# Patient Record
Sex: Female | Born: 1935 | Race: White | Hispanic: No | Marital: Married | Smoking: Never smoker
Health system: Southern US, Community
[De-identification: ages and names within clinical notes are randomized; demographics above are authoritative.]

## PROBLEM LIST (undated history)

## (undated) DIAGNOSIS — E559 Vitamin D deficiency, unspecified: Secondary | ICD-10-CM

## (undated) DIAGNOSIS — E785 Hyperlipidemia, unspecified: Secondary | ICD-10-CM

## (undated) DIAGNOSIS — E119 Type 2 diabetes mellitus without complications: Secondary | ICD-10-CM

## (undated) DIAGNOSIS — I10 Essential (primary) hypertension: Secondary | ICD-10-CM

## (undated) DIAGNOSIS — I639 Cerebral infarction, unspecified: Secondary | ICD-10-CM

## (undated) DIAGNOSIS — J9600 Acute respiratory failure, unspecified whether with hypoxia or hypercapnia: Secondary | ICD-10-CM

## (undated) DIAGNOSIS — G40909 Epilepsy, unspecified, not intractable, without status epilepticus: Secondary | ICD-10-CM

## (undated) DIAGNOSIS — E079 Disorder of thyroid, unspecified: Secondary | ICD-10-CM

## (undated) DIAGNOSIS — F259 Schizoaffective disorder, unspecified: Secondary | ICD-10-CM

## (undated) DIAGNOSIS — N289 Disorder of kidney and ureter, unspecified: Secondary | ICD-10-CM

---

## 1998-07-04 ENCOUNTER — Ambulatory Visit (HOSPITAL_COMMUNITY): Admission: RE | Admit: 1998-07-04 | Discharge: 1998-07-04 | Payer: Self-pay | Admitting: Cardiology

## 1998-07-05 ENCOUNTER — Encounter: Payer: Self-pay | Admitting: Cardiology

## 1999-04-04 ENCOUNTER — Ambulatory Visit (HOSPITAL_COMMUNITY): Admission: RE | Admit: 1999-04-04 | Discharge: 1999-04-04 | Payer: Self-pay | Admitting: Cardiology

## 1999-04-04 ENCOUNTER — Encounter: Payer: Self-pay | Admitting: Cardiology

## 1999-12-13 ENCOUNTER — Inpatient Hospital Stay (HOSPITAL_COMMUNITY): Admission: EM | Admit: 1999-12-13 | Discharge: 1999-12-23 | Payer: Self-pay | Admitting: Psychiatry

## 1999-12-15 ENCOUNTER — Encounter: Payer: Self-pay | Admitting: Psychiatry

## 2001-10-28 ENCOUNTER — Encounter: Payer: Self-pay | Admitting: Internal Medicine

## 2001-10-28 ENCOUNTER — Encounter: Admission: RE | Admit: 2001-10-28 | Discharge: 2001-10-28 | Payer: Self-pay | Admitting: Internal Medicine

## 2004-04-26 ENCOUNTER — Ambulatory Visit: Payer: Self-pay | Admitting: Unknown Physician Specialty

## 2004-05-27 ENCOUNTER — Ambulatory Visit: Payer: Self-pay | Admitting: Unknown Physician Specialty

## 2004-06-26 ENCOUNTER — Ambulatory Visit: Payer: Self-pay | Admitting: Unknown Physician Specialty

## 2004-07-27 ENCOUNTER — Ambulatory Visit: Payer: Self-pay | Admitting: Unknown Physician Specialty

## 2004-09-04 ENCOUNTER — Ambulatory Visit: Payer: Self-pay | Admitting: Unknown Physician Specialty

## 2004-09-24 ENCOUNTER — Ambulatory Visit: Payer: Self-pay | Admitting: Unknown Physician Specialty

## 2004-10-25 ENCOUNTER — Ambulatory Visit: Payer: Self-pay | Admitting: Unknown Physician Specialty

## 2004-12-05 ENCOUNTER — Ambulatory Visit: Payer: Self-pay | Admitting: Unknown Physician Specialty

## 2004-12-25 ENCOUNTER — Ambulatory Visit: Payer: Self-pay | Admitting: Unknown Physician Specialty

## 2005-01-28 ENCOUNTER — Ambulatory Visit: Payer: Self-pay | Admitting: Unknown Physician Specialty

## 2005-02-24 ENCOUNTER — Ambulatory Visit: Payer: Self-pay | Admitting: Unknown Physician Specialty

## 2005-03-27 ENCOUNTER — Ambulatory Visit: Payer: Self-pay | Admitting: Unknown Physician Specialty

## 2005-04-26 ENCOUNTER — Ambulatory Visit: Payer: Self-pay | Admitting: Unknown Physician Specialty

## 2005-05-27 ENCOUNTER — Ambulatory Visit: Payer: Self-pay | Admitting: Unknown Physician Specialty

## 2005-06-26 ENCOUNTER — Ambulatory Visit: Payer: Self-pay | Admitting: Unknown Physician Specialty

## 2005-07-27 ENCOUNTER — Ambulatory Visit: Payer: Self-pay | Admitting: Unknown Physician Specialty

## 2005-08-27 ENCOUNTER — Ambulatory Visit: Payer: Self-pay | Admitting: Unknown Physician Specialty

## 2005-09-24 ENCOUNTER — Ambulatory Visit: Payer: Self-pay | Admitting: Unknown Physician Specialty

## 2005-10-26 ENCOUNTER — Ambulatory Visit: Payer: Self-pay | Admitting: Unknown Physician Specialty

## 2005-11-24 ENCOUNTER — Ambulatory Visit: Payer: Self-pay | Admitting: Unknown Physician Specialty

## 2005-12-25 ENCOUNTER — Ambulatory Visit: Payer: Self-pay | Admitting: Unknown Physician Specialty

## 2006-01-24 ENCOUNTER — Ambulatory Visit: Payer: Self-pay | Admitting: Unknown Physician Specialty

## 2006-03-12 ENCOUNTER — Ambulatory Visit: Payer: Self-pay | Admitting: Unknown Physician Specialty

## 2006-03-27 ENCOUNTER — Ambulatory Visit: Payer: Self-pay | Admitting: Unknown Physician Specialty

## 2006-04-26 ENCOUNTER — Ambulatory Visit: Payer: Self-pay | Admitting: Unknown Physician Specialty

## 2006-05-27 ENCOUNTER — Ambulatory Visit: Payer: Self-pay | Admitting: Unknown Physician Specialty

## 2006-06-26 ENCOUNTER — Ambulatory Visit: Payer: Self-pay | Admitting: Unknown Physician Specialty

## 2006-07-27 ENCOUNTER — Ambulatory Visit: Payer: Self-pay | Admitting: Unknown Physician Specialty

## 2006-08-27 ENCOUNTER — Ambulatory Visit: Payer: Self-pay | Admitting: Unknown Physician Specialty

## 2006-09-25 ENCOUNTER — Ambulatory Visit: Payer: Self-pay | Admitting: Unknown Physician Specialty

## 2006-10-26 ENCOUNTER — Ambulatory Visit: Payer: Self-pay | Admitting: Unknown Physician Specialty

## 2006-11-25 ENCOUNTER — Ambulatory Visit: Payer: Self-pay | Admitting: Unknown Physician Specialty

## 2006-12-26 ENCOUNTER — Ambulatory Visit: Payer: Self-pay | Admitting: Unknown Physician Specialty

## 2007-01-25 ENCOUNTER — Ambulatory Visit: Payer: Self-pay | Admitting: Unknown Physician Specialty

## 2007-02-25 ENCOUNTER — Ambulatory Visit: Payer: Self-pay | Admitting: Unknown Physician Specialty

## 2007-03-28 ENCOUNTER — Ambulatory Visit: Payer: Self-pay | Admitting: Unknown Physician Specialty

## 2007-04-27 ENCOUNTER — Ambulatory Visit: Payer: Self-pay | Admitting: Unknown Physician Specialty

## 2007-05-30 ENCOUNTER — Ambulatory Visit: Payer: Self-pay | Admitting: Unknown Physician Specialty

## 2007-06-27 ENCOUNTER — Ambulatory Visit: Payer: Self-pay | Admitting: Unknown Physician Specialty

## 2007-07-29 ENCOUNTER — Ambulatory Visit: Payer: Self-pay | Admitting: Unknown Physician Specialty

## 2007-08-28 ENCOUNTER — Ambulatory Visit: Payer: Self-pay | Admitting: Unknown Physician Specialty

## 2007-09-25 ENCOUNTER — Ambulatory Visit: Payer: Self-pay | Admitting: Unknown Physician Specialty

## 2007-10-26 ENCOUNTER — Ambulatory Visit: Payer: Self-pay | Admitting: Unknown Physician Specialty

## 2007-11-25 ENCOUNTER — Ambulatory Visit: Payer: Self-pay | Admitting: Unknown Physician Specialty

## 2007-12-26 ENCOUNTER — Ambulatory Visit: Payer: Self-pay | Admitting: Unknown Physician Specialty

## 2008-01-25 ENCOUNTER — Ambulatory Visit: Payer: Self-pay | Admitting: Unknown Physician Specialty

## 2008-04-02 ENCOUNTER — Ambulatory Visit: Payer: Self-pay | Admitting: Unknown Physician Specialty

## 2008-04-26 ENCOUNTER — Ambulatory Visit: Payer: Self-pay | Admitting: Unknown Physician Specialty

## 2008-05-27 ENCOUNTER — Ambulatory Visit: Payer: Self-pay | Admitting: Unknown Physician Specialty

## 2008-06-26 ENCOUNTER — Ambulatory Visit: Payer: Self-pay | Admitting: Unknown Physician Specialty

## 2008-07-27 ENCOUNTER — Ambulatory Visit: Payer: Self-pay | Admitting: Unknown Physician Specialty

## 2008-08-27 ENCOUNTER — Ambulatory Visit: Payer: Self-pay | Admitting: Unknown Physician Specialty

## 2008-09-19 ENCOUNTER — Encounter: Admission: RE | Admit: 2008-09-19 | Discharge: 2008-09-19 | Payer: Self-pay | Admitting: Internal Medicine

## 2008-09-24 ENCOUNTER — Ambulatory Visit: Payer: Self-pay | Admitting: Unknown Physician Specialty

## 2008-10-25 ENCOUNTER — Ambulatory Visit: Payer: Self-pay | Admitting: Unknown Physician Specialty

## 2008-11-24 ENCOUNTER — Ambulatory Visit: Payer: Self-pay | Admitting: Unknown Physician Specialty

## 2008-12-25 ENCOUNTER — Ambulatory Visit: Payer: Self-pay | Admitting: Unknown Physician Specialty

## 2009-02-24 ENCOUNTER — Ambulatory Visit: Payer: Self-pay | Admitting: Unknown Physician Specialty

## 2009-03-27 ENCOUNTER — Ambulatory Visit: Payer: Self-pay | Admitting: Unknown Physician Specialty

## 2009-05-27 ENCOUNTER — Ambulatory Visit: Payer: Self-pay | Admitting: Unknown Physician Specialty

## 2009-07-29 ENCOUNTER — Ambulatory Visit: Payer: Self-pay | Admitting: Unknown Physician Specialty

## 2009-09-24 ENCOUNTER — Ambulatory Visit: Payer: Self-pay | Admitting: Unknown Physician Specialty

## 2009-11-26 ENCOUNTER — Ambulatory Visit: Payer: Self-pay | Admitting: Unknown Physician Specialty

## 2009-12-25 ENCOUNTER — Ambulatory Visit: Payer: Self-pay | Admitting: Unknown Physician Specialty

## 2010-01-24 ENCOUNTER — Ambulatory Visit: Payer: Self-pay | Admitting: Unknown Physician Specialty

## 2010-02-24 ENCOUNTER — Ambulatory Visit: Payer: Self-pay | Admitting: Unknown Physician Specialty

## 2010-03-27 ENCOUNTER — Ambulatory Visit: Payer: Self-pay | Admitting: Unknown Physician Specialty

## 2010-05-27 ENCOUNTER — Ambulatory Visit: Payer: Self-pay | Admitting: Unknown Physician Specialty

## 2010-06-26 ENCOUNTER — Ambulatory Visit: Payer: Self-pay | Admitting: Unknown Physician Specialty

## 2010-07-28 ENCOUNTER — Ambulatory Visit: Payer: Self-pay | Admitting: Unknown Physician Specialty

## 2010-08-16 ENCOUNTER — Encounter: Payer: Self-pay | Admitting: Internal Medicine

## 2010-08-17 ENCOUNTER — Encounter: Payer: Self-pay | Admitting: Internal Medicine

## 2010-08-27 ENCOUNTER — Ambulatory Visit: Payer: Self-pay | Admitting: Unknown Physician Specialty

## 2010-09-25 ENCOUNTER — Ambulatory Visit: Payer: Self-pay | Admitting: Unknown Physician Specialty

## 2010-10-27 ENCOUNTER — Ambulatory Visit: Payer: Self-pay | Admitting: Unknown Physician Specialty

## 2010-11-25 ENCOUNTER — Ambulatory Visit: Payer: Self-pay | Admitting: Unknown Physician Specialty

## 2010-12-12 NOTE — H&P (Signed)
[facility]  Patient:    Ashley Velez, Ashley Velez                   MRN: [id number] Adm. Date:  [id number] Attending:  Theresa Mulligan Fabmy Dictator:   Romona Curls, N.P.                         History and Physical  IDENTIFYING INFORMATION:  Patient is a 76 year old married white female, voluntarily admitted secondary to increasing confusion and change in mental status.  HISTORY OF PRESENT ILLNESS:  Patient has a long history of schizoaffective disorder.  She has been hospitalized in the past, as well as receiving ECT treatments.  She is currently an outpatient client of [facility].  Family reports that patient has been acting bizarrely, has not been taking care of herself or taking care of her ADLs.  She has not been following her diabetic regimen.  She has been very confused, does not know where she is. Patient presents today very confused.  She does articulate thinking that she thought that she was dead at one time, that her husband Pilar Plate was dead at one time, and states she was "tearing clothes apart," in response to being upset thinking that her husband was dead.  She is a very poor historian at present, as she is still quite confused and she is unable to really articulate specifically with circumstances leading to this hospitalization.  She does say, "I had no intention of killing myself."  She denies depression or suicidal or homicidal ideation.  It is difficult to understand whether she was experiencing auditory and visual hallucinations.  She is describing delusions, thinking that she had died and different people had died and was not sure if what she was seeing and thinking were "dreams."  Family reports that she has had increased sleep.  PAST PSYCHIATRIC HISTORY:  Again, given the patients poor historical status at this point, it is known that she has had an inpatient hospitalization approximately six years ago.  It does appear  that she has had subsequent hospitalizations and has received ECT in 1995 and questionably in the year 2000.  She is married.  She was unable to tell me how many years.  She does have one child, was unable to tell me whether it was a boy or girl.  FAMILY HISTORY:  She states her mother tried to commit suicide and has "mental illness," but was unable to articulate whether mother actually suicided or is still alive.  ALCOHOL AND DRUG HISTORY:  She denies.  MEDICAL HISTORY:  Primary care Erryn Dickison is an [facility].  She has seen Dr. Erling Cruz in neurology in the past.  MEDICAL PROBLEMS:  Diabetes, type 2.  She has a recent diagnosis of hyperthyroidism that appeared with radiation treatment in December of 2000. History of a brain lesion in 1995, osteoarthritis.  She also had a single seizure episode in 1988.  MEDICATIONS: 1. Zyprexa 10 mg q.h.s. 2. Effexor XR 75 mg q.d. 3. Stelazine 10 mg q.h.s. 4. Prinivil 5 mg q.d.  Per patients husband, this has been on hold because    patients blood pressure has been low. 5. Ativan 1 mg b.i.d. 6. Glucotrol XL 10 mg two tabs q.d. 7. _________  50 mg b.i.d.  DRUG ALLERGIES:  She has no known allergies.  PHYSICAL EXAMINATION:  Physical exam is pending.  Patient refuses exam at present.  She appears quite perplexed and confused.  Patient is in no acute distress.  Her CBGs this a.m. were 92.  Her vital signs were 183/93, 177/92, 16, 71, and 98.9.  MENTAL STATUS EXAMINATION:  Appearance and Behavior:  Casually dressed white female.  She is exhibiting psychomotor retardation and her speech is sparse, irrelevant at times.  Her mood is perplexed with depressive overtones.  She appears somewhat anxious and guarded.  She is very tangential and difficult to keep on track.  She knows she is in a psychiatric hospital but was unable to give the name.  She was unable to give the year or the month.  It is difficult to know whether she does not actually know these  items, or whether she is just unable to focus on the conversation enough to attempt an answer.  She denies suicidal ideation, homicidal ideation.  Again she could not articulate auditory and visual hallucinations.  She presents quite delusional, thinking that she herself has died as well as other family members.  DIAGNOSES: Axis I:    Schizoaffective disorder, rule out organic affective disorder. Axis II:   Deferred. Axis III:  1. Diabetes mellitus.            2. Osteoarthritis.            3. History of hyperthyroidism.            4. History of brain lesions. Axis IV:   Severe, related to problems with primary support group and medical            problems. Axis V:    Current GAF is 25, highest this past year was 60.  TREATMENT PLAN AND RECOMMENDATIONS:  We will voluntarily admit Ms. [name] to [facility] for stabilization, provide 15 minute checks for safety.  We will resume her Zyprexa 10 mg q.h.s.  We will increase her Effexor XR to 150 mg q.d., Stelazine 10 mg q.h.s., Ativan 1 mg t.i.d.  Additionally we will check her CBGs twice a day.  We will give Glucotrol XL 10 mg q.d. until we can establish patients blood sugar readings.  We will resume her _________  50 mg b.i.d.  Schedule MRI for May 21 to rule out affective disorder.  LABORATORY VALUES:  Pending laboratory values at time of this dictation are TSH, T4, T3, UA, and UDS.  Tentative length of stay and discharge plans will be 4-5 days with follow-up with Dr. Theresa Mulligan at [facility]. DD:  12/14/99 TD:  12/15/99 Job: [id number]

## 2010-12-12 NOTE — H&P (Signed)
[facility]  Patient:    Ashley Velez, Ashley Velez                   MRN: [id number] Adm. Date:  [id number] Attending:  Theresa Mulligan Fabmy Dictator:   Romona Curls, N.P.                         History and Physical  ALLERGIES:  No known allergies.  REVIEW OF SYSTEMS:  The patient was admitted to the hospital with acute mental status changes and severe depression with psychotic features.  She still remains a poor historian.  It is difficult for her to keep on track and answer questions.  This will be a limited review of systems, given this.  HEENT: Head:  The patient denies headache.  Eyes:  The patient wears glasses.  Ears: Denies earache.  Nose:  Denies nose bleed.  CARDIOVASCULAR:  States she has had chest pain in the past.  Denies heart attack.  Says she has high blood pressure.  PULMONARY:  States she is a smoker, and she is unable to quantify how much.  Denies a history of asthma.  GASTROINTESTINAL:  Denies abdominal pain, peptic ulcer disease, nausea, vomiting, diarrhea.  Was unable to articulate when her last bowel movement was.  GENITOURINARY:  Denied dysuria, urgency, and frequency.  MUSCULOSKELETAL:  States she has never fractured anything and suffers from no pain.  NEUROLOGIC:  She had a single seizure in 1988 and a history of a lesion diagnosed in 1995.  HEMATOPOIETIC:  The patient thinks she has received a blood transfusion.  She was unable to articulate in response to what or when.  ENDOCRINE:  The patient has a history of hyperthyroidism with radiation treatment in December 2000.  She is diabetic.  PHYSICAL EXAMINATION:  VITAL SIGNS:  Temperature 97.6, pulse 74, respirations 16, blood pressure 195/95.  GENERAL:  She is a well-nourished white female.  She is minimally cooperative. Again, she appears profoundly depressed, regressed, with psychomotor retardation.  SKIN:  No abnormal bruises, rashes, or swelling noted.  HEAD:   Normocephalic.  Eyes:  Extraocular movements were intact.  Pupils were equal, round and reactive to light.  Funduscopic exam was not visualized. Patient was unable to cooperate with exam.  Ears:  Unable to visualize tympanic membranes.  There was cerumen bilaterally.  Nose:  Nares were erythematous.  There was no septal deviation noted.  Throat:  Buccal mucosa was clear.  Tongue was in midline, no vesiculations.  Dentition:  The patient had several missing teeth.  Dentition appears to be in poor repair.  NECK:  Supple with trachea midline.  THORAX:  There is kyphosis noted.  BREASTS:  Deferred.  LUNGS:  Clear to auscultation with good respiratory effort.  HEART:  S1, S2, no clicks, rubs, murmurs could be appreciated.  VASCULAR:  Pulses were equal.  There were no bruits at carotid, aortic, or renal.  There was no dependent edema.  The patient had good pedal pulses.  ABDOMEN:  Soft without tenderness to palpation.  There were no masses palpated.  Bowel sounds x 4 were noted.  There was no organomegaly noted.  GLANDULAR:  There was no adenopathy noted at supraclavicular, cervical.  Her thyroid felt full.  There were no nodules appreciated.  BONES AND JOINTS:  Osteoarthritis changes noted to metacarpal joints.  There was no tenderness with palpation.  MUSCULOSKELETAL:  Strength was equal, 5/5 bilaterally.  There was no atrophy noted,  and extremities had full range of motion with no cyanosis or clubbing.  NEUROLOGIC:  Cranial nerves II-XII were grossly intact.  Motor function was intact with 5/5.  Cerebellar function was intact.  Patient could perform finger-to-nose.  Gait:  She was able to walk in tandem, on her toes, on her heels.  Her Romberg was negative.  Reflexes were depressed at 1+.  IMPRESSION: 1. Hypertension.  Resume Prinivil and discontinue Effexor.  Will also add    clonidine 0.1 mg for systolic greater than 0000000, diastolic greater than 90,    until patient  stabilizes. 2. Depressed reflexes versus poor patients inability to cooperate with exam. 3. Change in mental status, severe depression with psychotic features. DD:  12/15/99 TD:  12/15/99 Job: [id number]

## 2010-12-12 NOTE — Discharge Summary (Signed)
[facility]  Patient:    Ashley Velez, Ashley Velez                   MRN: [id number] Adm. Date:  [id number] Disc. Date: [id number] Attending:  Theresa Mulligan Fabmy Dictator:   Dennard Nip, N.P.                           Discharge Summary  HISTORY OF PRESENT ILLNESS:  Patient is a 76 year old married white female voluntarily admitted secondary to increasing confusion and change in mental status.  Patient has a long history of schizoaffective disorder.  She has been hospitalized in the past as well as receiving ECT treatments.  She is currently an outpatient client of [facility].  Family reports that patient has been acting bizarrely, has not been taking care of herself or taking care of her ADLs.  She has not been following her diabetic regime.  She has been very confused, does not know where she is.  Patient presents today very confused.  She does not articulate, thinking that she thought that she was dead at one time, that her husband Pilar Plate was dead at one time, and states she was tearing clothes apart in response to being upset, thinking that her husband was dead.  She is a very poor historian at present and she is still quite confused and she is unable to really articulate specifically with circumstances leading to this hospitalization.  She does say, "I had no intention of killing myself."  She denies depression or experiencing auditory or visual hallucinations.  She is describing delusions, thinking that she has died and different people have died and she was not sure if what she was seeing and thinking were "dreams."  Family has reported that she has had increased sleep.  PSYCHIATRIC HISTORY:  Patient has had an inpatient hospitalization approximately six years ago with subsequent hospitalizations and receiving ECT in 1995 and some question if she received it in 2000.  MEDICAL HISTORY:  Primary care doctor is [facility].  She also  has seen Dr. Erling Cruz, neurologist in the past.  Patient has a history of diabetes mellitus, type 2, recent diagnosis of hyperthyroidism, history of a brain lesion in 1995, osteoarthritis, single seizure episode in 1988.  ADMISSION MEDICATIONS: 1. Zyprexa 10 mg q.h.s. 2. Effexor XR 75 mg q.d. 3. Seldane 10 mg q.h.s. 4. Prinivil 5 mg q.d.  Per patients husband this has been placed on hold    because patients blood pressure has been low. 5. Ativan 1 mg b.i.d. 6. Glucotrol XL 10 mg two tabs q.d.  DRUG ALLERGIES:  No known drug allergies.  PHYSICAL EXAMINATION:  Physical exam is pending.  Patient refuses exam at present.  She appears quite perplexed and confused.  Patient is in no acute distress.  LABORATORY:  Her CBGs this a.m. were 92.  Vital signs were blood pressure 183/93, 177/92, respirations 16, pulse 71, and temperature 98.9.  While in the hospital she continued to refuse to have a physical exam.  MENTAL STATUS EXAMINATION:  A casually dressed white female, exhibiting psychomotor retardation and her speech is sparse and irrelevant at times.  Her mood is perplexed with depressive overtones, somewhat anxious and guarded, very tangential and difficult to keep on track.  She knows she is in the psychiatric hospital, is unable to give the name.  She was unable to give the year or the month and it was difficult to know  whether she does not actually know these items, or whether she is unable to focus on the conversation enough to attempt an answer.  She denied suicidal ideation or homicidal ideation. Again she could not articulate auditory or visual hallucinations.  She presents quite delusional, thinking that she herself has died as well as other family members.  DIAGNOSES ON ADMISSION: Axis I:    Schizoaffective disorder, rule out organic affective disorder. Axis II:   Deferred. Axis III:  1. Diabetes mellitus, type 2.            2. Osteoarthritis.            3. History of  hyperthyroidism.            4. History of brain lesion. Axis IV:   Severe, related to problems with primary support group and medical            problems. Axis V:    Current global assessment of functioning is 25, highest in past            year was 51.  LABORATORY DATA:  Her CBC with differential, white blood count was elevated at 11.8.  Her C-MET showed some abnormalities.  Her sodium was increased at 146. Her potassium was within normal limits at 3.8.  Chloride was high at 114, and her glucose was high at 173.  Her T3 was high at 41.7, TSH within normal limits, free T4 was within normal limits.  HOSPITAL COURSE:  The patient was admitted to the psychiatric unit and was initially placed on Zyprexa 10 mg at h.s.  She supposedly is on Seldane 10 mg but we held that until the husband could give [place] a schedule.  We started her on Effexor XR 150 mg p.o. q.d. and Ativan 1 mg t.i.d.  We did check her CBGs b.i.d. and she was on Glucotrol XL 10 mg q.d.  We did have a MRI on May 21 to rule out any organic disorder and we did schedule the Seldane for 10 mg at h.s.  On the 20th we did reduce her Effexor XL to 75 p.o. q.d. and gave her Prinivil 5 mg p.o. q.d.  We also ordered Clonidine 0.1 mg q.4h. p.r.n. for hypertension and then ordered some Haldol 2 mg p.o. or IM p.r.n. agitation. We also added Valium 10 mg q.4h. p.r.n. for agitation, stopped the Effexor, and started her on Wellbutrin SR 150 mg h.s.  Then we increased the Wellbutrin SR to 150 mg b.i.d.  We continued to monitor her, monitor her vital signs and her CBGs.  We changed the Wellbutrin to 150 SR in the morning and placed her on 2000 calorie ADA diet along with Robitussin 10 cc p.r.n. for cough.  She was confused and delusional, believing that she is on vacation, needs to unpack.  She did undress to the waist and came down to the nurses station, needing frequent redirection.  Finally she had to be moved to another room so she could be  closer to the nurses station due to her needs.  The MRI results came in and she was subsequently referred to neurologist.  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  Differential considerations are acoustic neuroma versus Schwannoma versus meningioma.  Less likely possibilities are a epidermoid versus metastatic disease versus lymphoma.  There were some small vessel type disease changes, _________ and then there were  some inflammatory changes in the paranasal sinuses as described.  Throughout her stay the patient continued to seem perplexed, delusional, religiously preoccupied.  On May 24 she was marginally improved, but she remained perplexed and somewhat confused.  It was decided to transfer the patient to [facility] for further treatment.  A bed at [facility] was given away. Both [facility] and [facility] have patients information and will call with available beds.  Patient was seen on May 25.  She seemed to be a little bit more organized, speech relevant, less confused.  Her mood and affect were brighter, and it was decided she will be discharged and transferred.  A family session was ordered with the husband to ascertain his opinion of patients readiness for patient management.  It was felt like we could ask him to pursue home health report if he needed it.  On May 26 patient continued to have some confusion with some disorientation at times.  She says she is unsure if she is on the right floor.  She is less depressed but still disorganized, she regrets.  She reports not sleeping well, but appeared to sleep six hours. Blood pressure has been elevated.  Patient was not yet ready for discharge. Husband feels she is not improved enough for him to manage her at home.  The Wellbutrin was increased at that time.  On May 27 the patient was still very confused with disorientation.  She reported  not sleeping a lot, but the sleep was recorded as eight hours.  Appetite was fair and we continued on the Wellbutrin.  Patient reported feeling a little bit better on the 28th and she appears less depressed.  She still reports periods of confusion and disorientation.  Appetite was fair and sleep was fair.  On May 28 the husband still felt the patient needed to be transferred for ECT.  On Dec 23, 1999 the patient is unchanged today.  She remains withdrawn with disorganized thinking and some confusion.  She slept five hours.  Her appetite was decreased.  She continued to appear depressed, although she has some periods of time in which she appeared a little brighter.  Patients husband feels she is reaching near her baseline.  She responded well to ECT in the past and we felt like she needed to be evaluated for ECT at [facility] and it was decided that she would be discharged and transferred to [facility] for workup of ECT.  CONDITION ON DISCHARGE:  The patient is improved in some ways that she is sleeping and eating okay.  There is no suicidal ideation; however, patient did remain confused, somewhat withdrawn and disoriented.  DISPOSITION:  The patient is transferred to [facility] for consideration of ECT.  FOLLOW-UP:  The patient is to follow up at [facility] for consideration of ECT.  DISCHARGE MEDICATIONS: 1. Zyprexa 10 mg one tablet at h.s. 2. Ativan 1 mg t.i.d. 3. Glucotrol XL 10 mg one tab daily. 4. Seldane 10 mg one tab at h.s. 5. Prinivil 5 mg one tab daily. 6. Wellbutrin SR 150 one tablet morning and at noon. 7. Clonidine 0.1 mg one tablet twice a day as needed. 8. Diclofunte 50 mg one tablet b.i.d.  FINAL DIAGNOSES: Axis I:    Schizoaffective disorder, rule out organic effective disorder. Axis II:   Deferred. Axis III:  1. Diabetes mellitus.            2. Osteoarthritis.  3. History of hyperthyroidism.             4. History of brain lesions. Axis IV:   Severe, related to medical problems and confusion. Axis V:    Current global assessment of functioning 40, highest in past            year was 81. DD:  01/08/00 TD:  01/13/00 Job: [id number]

## 2011-01-26 ENCOUNTER — Ambulatory Visit: Payer: Self-pay | Admitting: Unknown Physician Specialty

## 2011-02-25 ENCOUNTER — Ambulatory Visit: Payer: Self-pay | Admitting: Unknown Physician Specialty

## 2011-03-28 ENCOUNTER — Ambulatory Visit: Payer: Self-pay | Admitting: Unknown Physician Specialty

## 2011-05-28 ENCOUNTER — Ambulatory Visit: Payer: Self-pay | Admitting: Unknown Physician Specialty

## 2011-06-29 ENCOUNTER — Ambulatory Visit: Payer: Self-pay | Admitting: Unknown Physician Specialty

## 2011-07-29 ENCOUNTER — Ambulatory Visit: Payer: Self-pay | Admitting: Unknown Physician Specialty

## 2011-09-25 ENCOUNTER — Ambulatory Visit: Payer: Self-pay | Admitting: Unknown Physician Specialty

## 2011-10-26 ENCOUNTER — Ambulatory Visit: Payer: Self-pay | Admitting: Unknown Physician Specialty

## 2011-11-25 ENCOUNTER — Ambulatory Visit: Payer: Self-pay | Admitting: Unknown Physician Specialty

## 2011-12-26 ENCOUNTER — Ambulatory Visit: Payer: Self-pay | Admitting: Unknown Physician Specialty

## 2012-01-25 ENCOUNTER — Ambulatory Visit: Payer: Self-pay | Admitting: Unknown Physician Specialty

## 2012-03-30 ENCOUNTER — Ambulatory Visit: Payer: Self-pay | Admitting: Unknown Physician Specialty

## 2012-04-26 ENCOUNTER — Ambulatory Visit: Payer: Self-pay | Admitting: Unknown Physician Specialty

## 2012-06-26 ENCOUNTER — Ambulatory Visit: Payer: Self-pay | Admitting: Psychiatry

## 2012-07-27 ENCOUNTER — Ambulatory Visit: Payer: Self-pay | Admitting: Psychiatry

## 2012-08-29 ENCOUNTER — Ambulatory Visit: Payer: Self-pay | Admitting: Psychiatry

## 2012-10-31 ENCOUNTER — Ambulatory Visit: Payer: Self-pay | Admitting: Psychiatry

## 2012-11-24 ENCOUNTER — Ambulatory Visit: Payer: Self-pay | Admitting: Psychiatry

## 2013-01-24 ENCOUNTER — Ambulatory Visit: Payer: Self-pay | Admitting: Psychiatry

## 2013-02-24 ENCOUNTER — Ambulatory Visit: Payer: Self-pay | Admitting: Psychiatry

## 2013-03-27 ENCOUNTER — Ambulatory Visit: Payer: Self-pay | Admitting: Psychiatry

## 2013-04-26 ENCOUNTER — Ambulatory Visit: Payer: Self-pay | Admitting: Psychiatry

## 2013-05-22 ENCOUNTER — Ambulatory Visit
Admission: RE | Admit: 2013-05-22 | Discharge: 2013-05-22 | Disposition: A | Discharge status: Home / Self Care | Payer: No Typology Code available for payment source | Source: Ambulatory Visit | Attending: Internal Medicine | Admitting: Internal Medicine

## 2013-05-22 ENCOUNTER — Other Ambulatory Visit: Payer: Self-pay | Admitting: Internal Medicine

## 2013-05-22 DIAGNOSIS — R531 Weakness: Secondary | ICD-10-CM

## 2013-05-24 ENCOUNTER — Other Ambulatory Visit (HOSPITAL_COMMUNITY): Payer: Self-pay | Admitting: Internal Medicine

## 2013-05-24 ENCOUNTER — Ambulatory Visit (HOSPITAL_COMMUNITY)
Admission: RE | Admit: 2013-05-24 | Discharge: 2013-05-24 | Disposition: A | Discharge status: Home / Self Care | Payer: Medicare Other | Source: Ambulatory Visit | Attending: Vascular Surgery | Admitting: Vascular Surgery

## 2013-05-24 DIAGNOSIS — I6350 Cerebral infarction due to unspecified occlusion or stenosis of unspecified cerebral artery: Secondary | ICD-10-CM | POA: Insufficient documentation

## 2013-05-27 ENCOUNTER — Ambulatory Visit: Payer: Self-pay | Admitting: Psychiatry

## 2013-06-06 ENCOUNTER — Encounter: Payer: Self-pay | Admitting: Cardiology

## 2013-06-06 ENCOUNTER — Ambulatory Visit (HOSPITAL_COMMUNITY): Payer: Medicare Other | Attending: Cardiology

## 2013-06-06 ENCOUNTER — Other Ambulatory Visit (HOSPITAL_COMMUNITY): Payer: Self-pay | Admitting: Internal Medicine

## 2013-06-06 DIAGNOSIS — I079 Rheumatic tricuspid valve disease, unspecified: Secondary | ICD-10-CM | POA: Insufficient documentation

## 2013-06-06 DIAGNOSIS — R5381 Other malaise: Secondary | ICD-10-CM | POA: Insufficient documentation

## 2013-06-06 DIAGNOSIS — I639 Cerebral infarction, unspecified: Secondary | ICD-10-CM

## 2013-06-06 DIAGNOSIS — I6789 Other cerebrovascular disease: Secondary | ICD-10-CM

## 2013-06-06 DIAGNOSIS — I6350 Cerebral infarction due to unspecified occlusion or stenosis of unspecified cerebral artery: Secondary | ICD-10-CM | POA: Insufficient documentation

## 2013-06-06 NOTE — Progress Notes (Signed)
Echocardiogram performed.  

## 2013-06-26 ENCOUNTER — Ambulatory Visit: Payer: Self-pay | Admitting: Psychiatry

## 2013-07-27 ENCOUNTER — Ambulatory Visit: Payer: Self-pay | Admitting: Psychiatry

## 2013-08-28 ENCOUNTER — Ambulatory Visit: Payer: Self-pay | Admitting: Psychiatry

## 2015-03-11 ENCOUNTER — Emergency Department (HOSPITAL_COMMUNITY)
Admission: EM | Admit: 2015-03-11 | Discharge: 2015-03-11 | Disposition: A | Discharge status: Home / Self Care | Payer: Medicare Other | Attending: Emergency Medicine | Admitting: Emergency Medicine

## 2015-03-11 ENCOUNTER — Emergency Department (HOSPITAL_COMMUNITY): Payer: Medicare Other

## 2015-03-11 ENCOUNTER — Encounter (HOSPITAL_COMMUNITY): Payer: Self-pay | Admitting: Emergency Medicine

## 2015-03-11 DIAGNOSIS — Z8673 Personal history of transient ischemic attack (TIA), and cerebral infarction without residual deficits: Secondary | ICD-10-CM | POA: Diagnosis not present

## 2015-03-11 DIAGNOSIS — Y999 Unspecified external cause status: Secondary | ICD-10-CM | POA: Diagnosis not present

## 2015-03-11 DIAGNOSIS — E119 Type 2 diabetes mellitus without complications: Secondary | ICD-10-CM | POA: Insufficient documentation

## 2015-03-11 DIAGNOSIS — I10 Essential (primary) hypertension: Secondary | ICD-10-CM | POA: Diagnosis not present

## 2015-03-11 DIAGNOSIS — S0101XA Laceration without foreign body of scalp, initial encounter: Secondary | ICD-10-CM

## 2015-03-11 DIAGNOSIS — Y92002 Bathroom of unspecified non-institutional (private) residence single-family (private) house as the place of occurrence of the external cause: Secondary | ICD-10-CM | POA: Diagnosis not present

## 2015-03-11 DIAGNOSIS — W2209XA Striking against other stationary object, initial encounter: Secondary | ICD-10-CM | POA: Diagnosis not present

## 2015-03-11 DIAGNOSIS — Y939 Activity, unspecified: Secondary | ICD-10-CM | POA: Insufficient documentation

## 2015-03-11 DIAGNOSIS — S0990XA Unspecified injury of head, initial encounter: Secondary | ICD-10-CM | POA: Diagnosis present

## 2015-03-11 HISTORY — DX: Cerebral infarction, unspecified: I63.9

## 2015-03-11 HISTORY — DX: Type 2 diabetes mellitus without complications: E11.9

## 2015-03-11 HISTORY — DX: Essential (primary) hypertension: I10

## 2015-03-11 MED ORDER — LIDOCAINE-EPINEPHRINE-TETRACAINE (LET) SOLUTION
3.0000 mL | Freq: Once | NASAL | Status: AC
  Administered 2015-03-11: 3 mL via TOPICAL
  Filled 2015-03-11: qty 3

## 2015-03-11 MED ORDER — LIDOCAINE-EPINEPHRINE-TETRACAINE (LET) SOLUTION
3.0000 mL | Freq: Once | NASAL | Status: AC
  Administered 2015-03-11: 3 mL via TOPICAL

## 2015-03-11 NOTE — ED Notes (Signed)
Assisted Dr Randal Buba.

## 2015-03-11 NOTE — ED Notes (Signed)
Dr. Palumbo at bedside. 

## 2015-03-11 NOTE — ED Provider Notes (Signed)
CSN: [id number]   Arrival date & time 03/11/15 0151  History  This chart was scribed for  Delontae Lamm, MD by Altamease Oiler, ED Scribe. This patient was seen in room B14C/B14C and the patient's care was started at 1:54 AM.  Chief Complaint  Patient presents with  . Fall   Level V caveat  HPI Patient is a 80 y.o. female presenting with fall. The history is provided by the spouse and a relative. No language interpreter was used.  Fall This is a recurrent problem. The current episode started less than 1 hour ago. The problem occurs constantly. The problem has not changed since onset.Pertinent negatives include no chest pain. Nothing aggravates the symptoms. Nothing relieves the symptoms. She has tried nothing for the symptoms. The treatment provided no relief.   Ashley Velez is a 80 y.o. female on Plavix who presents to the Emergency Department complaining of 2 falls in the last 24 hours. Most recently she fell in the bathroom and struck her head on the knob of a cabinet. Associated symptoms includes laceration at the posterior scalp. Pt denies extremity or other pain. Her husband states that she is at baseline mental status and has had several years of ECT up until last year.   Past Medical History  Diagnosis Date  . Hypertension   . Diabetes mellitus without complication   . Stroke     History reviewed. No pertinent past surgical history.  No family history on file.  Social History  Substance Use Topics  . Smoking status: Never Smoker   . Smokeless tobacco: None  . Alcohol Use: No     Review of Systems  Unable to perform ROS HENT:       Laceration at posterior scalp  Respiratory: Negative for wheezing.   Cardiovascular: Negative for chest pain.     Home Medications   Prior to Admission medications   Not on File    Allergies  Review of patient's allergies indicates no known allergies.  Triage Vitals: BP 174/73 mmHg  Pulse 75  Temp(Src) 98.7 F (37.1 C)  (Oral)  Resp 16  Ht 5\' 3"  (1.6 m)  Wt 110 lb (49.896 kg)  BMI 19.49 kg/m2  SpO2 100%  Physical Exam  Constitutional: She appears well-developed and well-nourished.  HENT:  Head: Normocephalic. Head is without raccoon's eyes and without Battle's sign.    Right Ear: No mastoid tenderness. No hemotympanum.  Left Ear: No mastoid tenderness. No hemotympanum.  Mouth/Throat: Oropharynx is clear and moist. No oropharyngeal exudate.  Occipital laceration with underlying hematoma No hemotympanum  Moist mucous membranes  Eyes: EOM are normal. Pupils are equal, round, and reactive to light.  No battle sign No racoon eyes  Neck: Normal range of motion. Neck supple.  Cardiovascular: Normal rate and regular rhythm.   Intact DP pulses  Pulmonary/Chest: Effort normal and breath sounds normal. No respiratory distress. She has no wheezes. She has no rales.  Lungs CTAB  Abdominal: Soft. Bowel sounds are normal. She exhibits no distension. There is no tenderness. There is no rebound and no guarding.  Musculoskeletal: Normal range of motion. She exhibits no edema or tenderness.  Neurological: She is alert. She has normal reflexes.  Skin: Skin is warm and dry.  Psychiatric: She has a normal mood and affect.  Nursing note and vitals reviewed.   ED Course  Procedures   DIAGNOSTIC STUDIES: Oxygen Saturation is 100% on RA, normal by my interpretation.    COORDINATION OF CARE: 2:02 AM  Discussed treatment plan which includes CT head without contrast, CT cervical spine without contrast, and wound care with pt at bedside and pt agreed to plan.  Labs Review- Labs Reviewed - No data to display  Imaging Review No results found.  EKG Interpretation None     MDM   Final diagnoses:  None   LACERATION REPAIR Performed by: Carlisle Beers Authorized by: Carlisle Beers Consent: Verbal consent obtained. Risks and benefits: risks, benefits and alternatives were discussed Consent  given by: patient Patient identity confirmed: provided demographic data Prepped and Draped in normal sterile fashion Wound explored  Laceration Location: scalp  Laceration Length: 3 cm  No Foreign Bodies seen or palpated  Anesthesia: LET  Irrigation method: syringe Amount of cleaning: standard  Skin closure: stepls  Number of sutures: 14  Patient tolerance: Patient tolerated the procedure well with no immediate complications.   Staple removal in 7 days at urgent care.  Keep covered x 24 hours.  No shampooing x 24 hours.     I personally performed the services described in this documentation, which was scribed in my presence. The recorded information has been reviewed and is accurate.      Veatrice Kells, MD 03/11/15 [phone]

## 2015-03-11 NOTE — ED Notes (Signed)
Pt arrives via EMS with c/o fall x2 today, most recent one head v doorknob. Pt on plavix. Unknown LOC

## 2015-09-06 ENCOUNTER — Encounter: Payer: Self-pay | Admitting: Podiatry

## 2015-09-06 ENCOUNTER — Ambulatory Visit (INDEPENDENT_AMBULATORY_CARE_PROVIDER_SITE_OTHER): Payer: Medicare Other | Admitting: Podiatry

## 2015-09-06 DIAGNOSIS — E1151 Type 2 diabetes mellitus with diabetic peripheral angiopathy without gangrene: Secondary | ICD-10-CM | POA: Diagnosis not present

## 2015-09-06 DIAGNOSIS — B351 Tinea unguium: Secondary | ICD-10-CM

## 2015-09-06 DIAGNOSIS — M79606 Pain in leg, unspecified: Secondary | ICD-10-CM

## 2015-09-06 NOTE — Progress Notes (Signed)
   Subjective:    Patient ID: Ashley Velez, female    DOB: January 31, 1935, 81 y.o.   MRN: [id number]  HPI  PT REQUESTING FOR TOENAILS TRIM.  Review of Systems     Objective:   Physical Exam        Assessment & Plan:

## 2015-09-09 NOTE — Progress Notes (Signed)
Subjective:     Patient ID: Ashley Velez, female   DOB: 05/16/1935, 81 y.o.   MRN: [id number]  HPI patient presents stating that I cannot take care of my nails and I'm a diabetic and they can become painful at times   Review of Systems  All other systems reviewed and are negative.      Objective:   Physical Exam  Constitutional: She is oriented to person, place, and time.  Cardiovascular: Intact distal pulses.   Musculoskeletal: Normal range of motion.  Neurological: She is oriented to person, place, and time.  Skin: Skin is warm and dry.  Nursing note and vitals reviewed.  neurovascular status was found to be intact muscle strength adequate with patient noted to have mild diminishment of sharp Dole vibratory. Patient's found to have thickened nailbeds 1-5 both feet with yellow dystrophic debris and pain when pressed dorsally and inability for herself to cut them. 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 Concerning foot care. Went ahead today and debrided nailbeds 1-5 both feet with no iatrogenic bleeding and reappoint for routine care or earlier if any issues should occur

## 2015-12-10 ENCOUNTER — Ambulatory Visit: Payer: Medicare Other | Admitting: Podiatry

## 2016-01-29 ENCOUNTER — Other Ambulatory Visit (HOSPITAL_COMMUNITY): Payer: Self-pay | Admitting: *Deleted

## 2016-01-30 ENCOUNTER — Ambulatory Visit (HOSPITAL_COMMUNITY)
Admission: RE | Admit: 2016-01-30 | Discharge: 2016-01-30 | Disposition: A | Discharge status: Home / Self Care | Payer: Medicare Other | Source: Ambulatory Visit | Attending: Internal Medicine | Admitting: Internal Medicine

## 2016-01-30 DIAGNOSIS — M810 Age-related osteoporosis without current pathological fracture: Secondary | ICD-10-CM | POA: Insufficient documentation

## 2016-01-30 MED ORDER — DENOSUMAB 60 MG/ML ~~LOC~~ SOLN
60.0000 mg | Freq: Once | SUBCUTANEOUS | Status: AC
  Administered 2016-01-30: 12:00:00 60 mg via SUBCUTANEOUS
  Filled 2016-01-30: qty 1

## 2016-01-30 NOTE — Discharge Instructions (Signed)
Denosumab injection  What is this medicine?  DENOSUMAB (den oh sue mab) slows bone breakdown. Prolia is used to treat osteoporosis in women after menopause and in men. Xgeva is used to prevent bone fractures and other bone problems caused by cancer bone metastases. Xgeva is also used to treat giant cell tumor of the bone.  This medicine may be used for other purposes; ask your health care provider or pharmacist if you have questions.  What should I tell my health care provider before I take this medicine?  They need to know if you have any of these conditions:  -dental disease  -eczema  -infection or history of infections  -kidney disease or on dialysis  -low blood calcium or vitamin D  -malabsorption syndrome  -scheduled to have surgery or tooth extraction  -taking medicine that contains denosumab  -thyroid or parathyroid disease  -an unusual reaction to denosumab, other medicines, foods, dyes, or preservatives  -pregnant or trying to get pregnant  -breast-feeding  How should I use this medicine?  This medicine is for injection under the skin. It is given by a health care professional in a hospital or clinic setting.  If you are getting Prolia, a special MedGuide will be given to you by the pharmacist with each prescription and refill. Be sure to read this information carefully each time.  For Prolia, talk to your pediatrician regarding the use of this medicine in children. Special care may be needed. For Xgeva, talk to your pediatrician regarding the use of this medicine in children. While this drug may be prescribed for children as young as 13 years for selected conditions, precautions do apply.  Overdosage: If you think you have taken too much of this medicine contact a poison control center or emergency room at once.  NOTE: This medicine is only for you. Do not share this medicine with others.  What if I miss a dose?  It is important not to miss your dose. Call your doctor or health care professional if you are  unable to keep an appointment.  What may interact with this medicine?  Do not take this medicine with any of the following medications:  -other medicines containing denosumab  This medicine may also interact with the following medications:  -medicines that suppress the immune system  -medicines that treat cancer  -steroid medicines like prednisone or cortisone  This list may not describe all possible interactions. Give your health care provider a list of all the medicines, herbs, non-prescription drugs, or dietary supplements you use. Also tell them if you smoke, drink alcohol, or use illegal drugs. Some items may interact with your medicine.  What should I watch for while using this medicine?  Visit your doctor or health care professional for regular checks on your progress. Your doctor or health care professional may order blood tests and other tests to see how you are doing.  Call your doctor or health care professional if you get a cold or other infection while receiving this medicine. Do not treat yourself. This medicine may decrease your body's ability to fight infection.  You should make sure you get enough calcium and vitamin D while you are taking this medicine, unless your doctor tells you not to. Discuss the foods you eat and the vitamins you take with your health care professional.  See your dentist regularly. Brush and floss your teeth as directed. Before you have any dental work done, tell your dentist you are receiving this medicine.  Do   not become pregnant while taking this medicine or for 5 months after stopping it. Women should inform their doctor if they wish to become pregnant or think they might be pregnant. There is a potential for serious side effects to an unborn child. Talk to your health care professional or pharmacist for more information.  What side effects may I notice from receiving this medicine?  Side effects that you should report to your doctor or health care professional as soon as  possible:  -allergic reactions like skin rash, itching or hives, swelling of the face, lips, or tongue  -breathing problems  -chest pain  -fast, irregular heartbeat  -feeling faint or lightheaded, falls  -fever, chills, or any other sign of infection  -muscle spasms, tightening, or twitches  -numbness or tingling  -skin blisters or bumps, or is dry, peels, or red  -slow healing or unexplained pain in the mouth or jaw  -unusual bleeding or bruising  Side effects that usually do not require medical attention (Report these to your doctor or health care professional if they continue or are bothersome.):  -muscle pain  -stomach upset, gas  This list may not describe all possible side effects. Call your doctor for medical advice about side effects. You may report side effects to FDA at 1-800-FDA-1088.  Where should I keep my medicine?  This medicine is only given in a clinic, doctor's office, or other health care setting and will not be stored at home.  NOTE: This sheet is a summary. It may not cover all possible information. If you have questions about this medicine, talk to your doctor, pharmacist, or health care provider.      2016, Elsevier/Gold Standard. (2012-01-11 12:37:47)

## 2016-06-17 ENCOUNTER — Encounter (HOSPITAL_COMMUNITY): Payer: Medicare Other

## 2016-07-03 ENCOUNTER — Encounter (HOSPITAL_COMMUNITY): Payer: Medicare Other

## 2016-08-12 ENCOUNTER — Other Ambulatory Visit (HOSPITAL_COMMUNITY): Payer: Self-pay | Admitting: *Deleted

## 2016-08-14 ENCOUNTER — Inpatient Hospital Stay (HOSPITAL_COMMUNITY): Admission: RE | Admit: 2016-08-14 | Payer: Medicare Other | Source: Ambulatory Visit

## 2016-08-28 ENCOUNTER — Encounter (HOSPITAL_COMMUNITY): Payer: Medicare Other

## 2016-09-11 ENCOUNTER — Ambulatory Visit (HOSPITAL_COMMUNITY)
Admission: RE | Admit: 2016-09-11 | Discharge: 2016-09-11 | Disposition: A | Discharge status: Home / Self Care | Payer: Medicare Other | Source: Ambulatory Visit | Attending: Internal Medicine | Admitting: Internal Medicine

## 2016-09-11 ENCOUNTER — Encounter (HOSPITAL_COMMUNITY): Payer: Medicare Other

## 2016-09-11 DIAGNOSIS — M810 Age-related osteoporosis without current pathological fracture: Secondary | ICD-10-CM | POA: Diagnosis not present

## 2016-09-11 MED ORDER — DENOSUMAB 60 MG/ML ~~LOC~~ SOLN
60.0000 mg | Freq: Once | SUBCUTANEOUS | Status: AC
  Administered 2016-09-11: 15:00:00 60 mg via SUBCUTANEOUS
  Filled 2016-09-11: qty 1

## 2016-09-24 DIAGNOSIS — J9600 Acute respiratory failure, unspecified whether with hypoxia or hypercapnia: Secondary | ICD-10-CM

## 2016-09-24 HISTORY — DX: Acute respiratory failure, unspecified whether with hypoxia or hypercapnia: J96.00

## 2016-10-19 ENCOUNTER — Other Ambulatory Visit: Payer: Self-pay | Admitting: Internal Medicine

## 2016-10-19 DIAGNOSIS — R911 Solitary pulmonary nodule: Secondary | ICD-10-CM

## 2016-10-23 ENCOUNTER — Other Ambulatory Visit: Payer: Medicare Other

## 2016-10-30 ENCOUNTER — Ambulatory Visit
Admission: RE | Admit: 2016-10-30 | Discharge: 2016-10-30 | Disposition: A | Discharge status: Home / Self Care | Payer: Medicare Other | Source: Ambulatory Visit | Attending: Internal Medicine | Admitting: Internal Medicine

## 2016-10-30 ENCOUNTER — Other Ambulatory Visit: Payer: Self-pay | Admitting: Internal Medicine

## 2016-10-30 DIAGNOSIS — R911 Solitary pulmonary nodule: Secondary | ICD-10-CM

## 2017-01-06 ENCOUNTER — Encounter (HOSPITAL_COMMUNITY): Payer: Self-pay

## 2017-01-06 ENCOUNTER — Inpatient Hospital Stay (HOSPITAL_COMMUNITY)
Admission: EM | Admit: 2017-01-06 | Discharge: 2017-01-12 | DRG: 637 | Disposition: A | Discharge status: Skilled Nursing Facility | Payer: Medicare Other | Attending: Internal Medicine | Admitting: Internal Medicine

## 2017-01-06 ENCOUNTER — Emergency Department (HOSPITAL_COMMUNITY): Payer: Medicare Other

## 2017-01-06 DIAGNOSIS — J988 Other specified respiratory disorders: Secondary | ICD-10-CM

## 2017-01-06 DIAGNOSIS — E039 Hypothyroidism, unspecified: Secondary | ICD-10-CM | POA: Diagnosis present

## 2017-01-06 DIAGNOSIS — E873 Alkalosis: Secondary | ICD-10-CM | POA: Diagnosis present

## 2017-01-06 DIAGNOSIS — I10 Essential (primary) hypertension: Secondary | ICD-10-CM | POA: Diagnosis present

## 2017-01-06 DIAGNOSIS — Z8673 Personal history of transient ischemic attack (TIA), and cerebral infarction without residual deficits: Secondary | ICD-10-CM

## 2017-01-06 DIAGNOSIS — T383X5A Adverse effect of insulin and oral hypoglycemic [antidiabetic] drugs, initial encounter: Secondary | ICD-10-CM | POA: Diagnosis present

## 2017-01-06 DIAGNOSIS — E785 Hyperlipidemia, unspecified: Secondary | ICD-10-CM | POA: Diagnosis present

## 2017-01-06 DIAGNOSIS — Z794 Long term (current) use of insulin: Secondary | ICD-10-CM | POA: Diagnosis not present

## 2017-01-06 DIAGNOSIS — G40901 Epilepsy, unspecified, not intractable, with status epilepticus: Secondary | ICD-10-CM

## 2017-01-06 DIAGNOSIS — G9341 Metabolic encephalopathy: Secondary | ICD-10-CM | POA: Diagnosis present

## 2017-01-06 DIAGNOSIS — J690 Pneumonitis due to inhalation of food and vomit: Secondary | ICD-10-CM | POA: Diagnosis not present

## 2017-01-06 DIAGNOSIS — D649 Anemia, unspecified: Secondary | ICD-10-CM | POA: Diagnosis present

## 2017-01-06 DIAGNOSIS — E162 Hypoglycemia, unspecified: Secondary | ICD-10-CM | POA: Diagnosis not present

## 2017-01-06 DIAGNOSIS — E1165 Type 2 diabetes mellitus with hyperglycemia: Secondary | ICD-10-CM | POA: Diagnosis not present

## 2017-01-06 DIAGNOSIS — J9600 Acute respiratory failure, unspecified whether with hypoxia or hypercapnia: Secondary | ICD-10-CM | POA: Diagnosis not present

## 2017-01-06 DIAGNOSIS — Z79899 Other long term (current) drug therapy: Secondary | ICD-10-CM | POA: Diagnosis not present

## 2017-01-06 DIAGNOSIS — R34 Anuria and oliguria: Secondary | ICD-10-CM | POA: Diagnosis not present

## 2017-01-06 DIAGNOSIS — Z7902 Long term (current) use of antithrombotics/antiplatelets: Secondary | ICD-10-CM

## 2017-01-06 DIAGNOSIS — G9340 Encephalopathy, unspecified: Secondary | ICD-10-CM | POA: Diagnosis not present

## 2017-01-06 DIAGNOSIS — I161 Hypertensive emergency: Secondary | ICD-10-CM | POA: Diagnosis not present

## 2017-01-06 DIAGNOSIS — R509 Fever, unspecified: Secondary | ICD-10-CM | POA: Diagnosis not present

## 2017-01-06 DIAGNOSIS — R569 Unspecified convulsions: Secondary | ICD-10-CM

## 2017-01-06 DIAGNOSIS — Z7984 Long term (current) use of oral hypoglycemic drugs: Secondary | ICD-10-CM

## 2017-01-06 DIAGNOSIS — R680 Hypothermia, not associated with low environmental temperature: Secondary | ICD-10-CM | POA: Diagnosis present

## 2017-01-06 DIAGNOSIS — R069 Unspecified abnormalities of breathing: Secondary | ICD-10-CM

## 2017-01-06 DIAGNOSIS — J9601 Acute respiratory failure with hypoxia: Secondary | ICD-10-CM

## 2017-01-06 DIAGNOSIS — J9690 Respiratory failure, unspecified, unspecified whether with hypoxia or hypercapnia: Secondary | ICD-10-CM

## 2017-01-06 DIAGNOSIS — N179 Acute kidney failure, unspecified: Secondary | ICD-10-CM | POA: Diagnosis present

## 2017-01-06 DIAGNOSIS — E11641 Type 2 diabetes mellitus with hypoglycemia with coma: Principal | ICD-10-CM | POA: Diagnosis present

## 2017-01-06 DIAGNOSIS — E876 Hypokalemia: Secondary | ICD-10-CM | POA: Diagnosis present

## 2017-01-06 DIAGNOSIS — E781 Pure hyperglyceridemia: Secondary | ICD-10-CM | POA: Diagnosis present

## 2017-01-06 DIAGNOSIS — E860 Dehydration: Secondary | ICD-10-CM | POA: Diagnosis present

## 2017-01-06 DIAGNOSIS — Z888 Allergy status to other drugs, medicaments and biological substances status: Secondary | ICD-10-CM

## 2017-01-06 DIAGNOSIS — R0989 Other specified symptoms and signs involving the circulatory and respiratory systems: Secondary | ICD-10-CM

## 2017-01-06 LAB — TYPE AND SCREEN
ABO/RH(D): A POS
Antibody Screen: NEGATIVE

## 2017-01-06 LAB — I-STAT ARTERIAL BLOOD GAS, ED
Acid-Base Excess: 5 mmol/L — ABNORMAL HIGH (ref 0.0–2.0)
Bicarbonate: 27.4 mmol/L (ref 20.0–28.0)
O2 Saturation: 100 %
Patient temperature: 98.7
TCO2: 28 mmol/L (ref 0–100)
pCO2 arterial: 32.5 mmHg (ref 32.0–48.0)
pH, Arterial: 7.533 — ABNORMAL HIGH (ref 7.350–7.450)
pO2, Arterial: 528 mmHg — ABNORMAL HIGH (ref 83.0–108.0)

## 2017-01-06 LAB — PROTIME-INR
INR: 1
PROTHROMBIN TIME: 13.2 s (ref 11.4–15.2)

## 2017-01-06 LAB — COMPREHENSIVE METABOLIC PANEL
ALT: 14 U/L (ref 14–54)
ANION GAP: 10 (ref 5–15)
AST: 26 U/L (ref 15–41)
Albumin: 4.2 g/dL (ref 3.5–5.0)
Alkaline Phosphatase: 48 U/L (ref 38–126)
BILIRUBIN TOTAL: 0.4 mg/dL (ref 0.3–1.2)
BUN: 24 mg/dL — ABNORMAL HIGH (ref 6–20)
CO2: 23 mmol/L (ref 22–32)
Calcium: 9.7 mg/dL (ref 8.9–10.3)
Chloride: 107 mmol/L (ref 101–111)
Creatinine, Ser: 1.57 mg/dL — ABNORMAL HIGH (ref 0.44–1.00)
GFR, EST AFRICAN AMERICAN: 35 mL/min — AB (ref >60)
GFR, EST NON AFRICAN AMERICAN: 30 mL/min — AB (ref >60)
Glucose, Bld: <20 mg/dL — CL (ref 65–99)
POTASSIUM: 3.9 mmol/L (ref 3.5–5.1)
Sodium: 140 mmol/L (ref 135–145)
TOTAL PROTEIN: 7.3 g/dL (ref 6.5–8.1)

## 2017-01-06 LAB — CBC
HCT: 33.1 % — ABNORMAL LOW (ref 36.0–46.0)
HEMOGLOBIN: 10.5 g/dL — AB (ref 12.0–15.0)
MCH: 27.7 pg (ref 26.0–34.0)
MCHC: 31.7 g/dL (ref 30.0–36.0)
MCV: 87.3 fL (ref 78.0–100.0)
PLATELETS: 308 10*3/uL (ref 150–400)
RBC: 3.79 MIL/uL — AB (ref 3.87–5.11)
RDW: 15.1 % (ref 11.5–15.5)
WBC: 12.5 10*3/uL — ABNORMAL HIGH (ref 4.0–10.5)

## 2017-01-06 LAB — CBG MONITORING, ED
Glucose-Capillary: 168 mg/dL — ABNORMAL HIGH (ref 65–99)
Glucose-Capillary: 257 mg/dL — ABNORMAL HIGH (ref 65–99)
Glucose-Capillary: 93 mg/dL (ref 65–99)
Glucose-Capillary: 119 mg/dL — ABNORMAL HIGH (ref 65–99)
Glucose-Capillary: 113 mg/dL — ABNORMAL HIGH (ref 65–99)
Glucose-Capillary: 121 mg/dL — ABNORMAL HIGH (ref 65–99)

## 2017-01-06 LAB — I-STAT CHEM 8, ED
BUN: 25 mg/dL — ABNORMAL HIGH (ref 6–20)
Calcium, Ion: 1.15 mmol/L (ref 1.15–1.40)
Chloride: 108 mmol/L (ref 101–111)
Creatinine, Ser: 1.4 mg/dL — ABNORMAL HIGH (ref 0.44–1.00)
Glucose, Bld: 149 mg/dL — ABNORMAL HIGH (ref 65–99)
HCT: 32 % — ABNORMAL LOW (ref 36.0–46.0)
Hemoglobin: 10.9 g/dL — ABNORMAL LOW (ref 12.0–15.0)
Potassium: 3.5 mmol/L (ref 3.5–5.1)
Sodium: 142 mmol/L (ref 135–145)
TCO2: 21 mmol/L (ref 0–100)

## 2017-01-06 LAB — RAPID URINE DRUG SCREEN, HOSP PERFORMED
Amphetamines: NOT DETECTED
Barbiturates: NOT DETECTED
Benzodiazepines: POSITIVE — AB
Cocaine: NOT DETECTED
Opiates: NOT DETECTED
Tetrahydrocannabinol: NOT DETECTED

## 2017-01-06 LAB — URINALYSIS, ROUTINE W REFLEX MICROSCOPIC
BILIRUBIN URINE: NEGATIVE
Bacteria, UA: NONE SEEN
Glucose, UA: 50 mg/dL — AB
HGB URINE DIPSTICK: NEGATIVE
KETONES UR: NEGATIVE mg/dL
LEUKOCYTES UA: NEGATIVE
NITRITE: NEGATIVE
PH: 6 (ref 5.0–8.0)
Protein, ur: 100 mg/dL — AB
SPECIFIC GRAVITY, URINE: 1.006 (ref 1.005–1.030)
Squamous Epithelial / HPF: NONE SEEN

## 2017-01-06 LAB — ETHANOL

## 2017-01-06 LAB — PHOSPHORUS: PHOSPHORUS: 3 mg/dL (ref 2.5–4.6)

## 2017-01-06 LAB — MAGNESIUM: MAGNESIUM: 1.9 mg/dL (ref 1.7–2.4)

## 2017-01-06 LAB — ABO/RH: ABO/RH(D): A POS

## 2017-01-06 LAB — TROPONIN I: Troponin I: <0.03 ng/mL (ref <0.03)

## 2017-01-06 LAB — TRIGLYCERIDES: Triglycerides: 596 mg/dL — ABNORMAL HIGH (ref <150)

## 2017-01-06 LAB — APTT: APTT: 29 s (ref 24–36)

## 2017-01-06 MED ORDER — FENTANYL CITRATE (PF) 100 MCG/2ML IJ SOLN
50.0000 ug | INTRAMUSCULAR | Status: DC | PRN
  Administered 2017-01-07: 50 ug via INTRAVENOUS
  Filled 2017-01-06: qty 2

## 2017-01-06 MED ORDER — CARVEDILOL 12.5 MG PO TABS
12.5000 mg | ORAL_TABLET | Freq: Two times a day (BID) | ORAL | Status: DC
  Administered 2017-01-07 – 2017-01-12 (×12): 12.5 mg via ORAL
  Filled 2017-01-06 (×12): qty 1

## 2017-01-06 MED ORDER — CLOPIDOGREL BISULFATE 75 MG PO TABS: 75.0000 mg | ORAL_TABLET | Freq: Every day | ORAL | Status: DC

## 2017-01-06 MED ORDER — ORAL CARE MOUTH RINSE
15.0000 mL | Freq: Four times a day (QID) | OROMUCOSAL | Status: DC
  Administered 2017-01-07 – 2017-01-09 (×10): 15 mL via OROMUCOSAL

## 2017-01-06 MED ORDER — PROPOFOL 1000 MG/100ML IV EMUL
INTRAVENOUS | Status: AC
  Filled 2017-01-06: qty 100

## 2017-01-06 MED ORDER — PANTOPRAZOLE SODIUM 40 MG PO PACK
40.0000 mg | PACK | ORAL | Status: DC
  Administered 2017-01-07 – 2017-01-10 (×4): 40 mg
  Filled 2017-01-06 (×5): qty 20

## 2017-01-06 MED ORDER — BISACODYL 10 MG RE SUPP: 10.0000 mg | Freq: Every day | RECTAL | Status: DC | PRN

## 2017-01-06 MED ORDER — CLOPIDOGREL BISULFATE 75 MG PO TABS
75.0000 mg | ORAL_TABLET | Freq: Every day | ORAL | Status: DC
  Administered 2017-01-07 – 2017-01-12 (×6): 75 mg
  Filled 2017-01-06 (×6): qty 1

## 2017-01-06 MED ORDER — DEXTROSE 50 % IV SOLN
INTRAVENOUS | Status: DC | PRN
  Administered 2017-01-06: 25 mL via INTRAVENOUS

## 2017-01-06 MED ORDER — DEXTROSE 50 % IV SOLN
1.0000 | Freq: Once | INTRAVENOUS | Status: AC
  Administered 2017-01-06: 50 mL via INTRAVENOUS

## 2017-01-06 MED ORDER — LEVOTHYROXINE SODIUM 50 MCG PO TABS: 50.0000 ug | ORAL_TABLET | Freq: Every day | ORAL | Status: DC

## 2017-01-06 MED ORDER — IRBESARTAN 150 MG PO TABS
300.0000 mg | ORAL_TABLET | Freq: Every day | ORAL | Status: DC
  Administered 2017-01-07 – 2017-01-09 (×3): 300 mg
  Filled 2017-01-06 (×3): qty 2

## 2017-01-06 MED ORDER — IRBESARTAN 300 MG PO TABS: 300.0000 mg | ORAL_TABLET | Freq: Every day | ORAL | Status: DC

## 2017-01-06 MED ORDER — SIMVASTATIN 20 MG PO TABS: 20.0000 mg | ORAL_TABLET | Freq: Every day | ORAL | Status: DC

## 2017-01-06 MED ORDER — SIMVASTATIN 20 MG PO TABS
20.0000 mg | ORAL_TABLET | Freq: Every day | ORAL | Status: DC
  Administered 2017-01-07 – 2017-01-10 (×5): 20 mg
  Filled 2017-01-06 (×6): qty 1

## 2017-01-06 MED ORDER — PROPOFOL 1000 MG/100ML IV EMUL
0.0000 ug/kg/min | INTRAVENOUS | Status: DC
  Administered 2017-01-06: 10 ug/kg/min via INTRAVENOUS

## 2017-01-06 MED ORDER — INSULIN ASPART 100 UNIT/ML ~~LOC~~ SOLN
0.0000 [IU] | SUBCUTANEOUS | Status: DC
  Administered 2017-01-07: 2 [IU] via SUBCUTANEOUS
  Administered 2017-01-07: 3 [IU] via SUBCUTANEOUS
  Administered 2017-01-07: 2 [IU] via SUBCUTANEOUS
  Administered 2017-01-07: 3 [IU] via SUBCUTANEOUS
  Administered 2017-01-07 – 2017-01-08 (×3): 2 [IU] via SUBCUTANEOUS
  Administered 2017-01-08 – 2017-01-09 (×2): 3 [IU] via SUBCUTANEOUS
  Administered 2017-01-09: 2 [IU] via SUBCUTANEOUS
  Administered 2017-01-10 (×2): 5 [IU] via SUBCUTANEOUS
  Administered 2017-01-10: 11 [IU] via SUBCUTANEOUS
  Administered 2017-01-11: 2 [IU] via SUBCUTANEOUS
  Administered 2017-01-11: 8 [IU] via SUBCUTANEOUS

## 2017-01-06 MED ORDER — MIDAZOLAM HCL 2 MG/2ML IJ SOLN
1.0000 mg | INTRAMUSCULAR | Status: DC | PRN
  Administered 2017-01-07: 1 mg via INTRAVENOUS
  Filled 2017-01-06: qty 2

## 2017-01-06 MED ORDER — DOCUSATE SODIUM 50 MG/5ML PO LIQD: 100.0000 mg | Freq: Two times a day (BID) | ORAL | Status: DC | PRN

## 2017-01-06 MED ORDER — LABETALOL HCL 5 MG/ML IV SOLN
10.0000 mg | INTRAVENOUS | Status: DC | PRN
  Administered 2017-01-07 – 2017-01-12 (×4): 10 mg via INTRAVENOUS
  Filled 2017-01-06 (×4): qty 4

## 2017-01-06 MED ORDER — ETOMIDATE 2 MG/ML IV SOLN
INTRAVENOUS | Status: DC | PRN
  Administered 2017-01-06: 20 mg via INTRAVENOUS

## 2017-01-06 MED ORDER — HYDRALAZINE HCL 20 MG/ML IJ SOLN
10.0000 mg | INTRAMUSCULAR | Status: DC | PRN
  Administered 2017-01-07: 10 mg via INTRAVENOUS
  Filled 2017-01-06 (×2): qty 1

## 2017-01-06 MED ORDER — SODIUM CHLORIDE 0.9 % IV SOLN
INTRAVENOUS | Status: DC
  Administered 2017-01-06 – 2017-01-08 (×3): via INTRAVENOUS

## 2017-01-06 MED ORDER — LEVOTHYROXINE SODIUM 50 MCG PO TABS
50.0000 ug | ORAL_TABLET | Freq: Every day | ORAL | Status: DC
  Administered 2017-01-07 – 2017-01-11 (×5): 50 ug
  Filled 2017-01-06 (×6): qty 1

## 2017-01-06 MED ORDER — CHLORHEXIDINE GLUCONATE 0.12% ORAL RINSE (MEDLINE KIT)
15.0000 mL | Freq: Two times a day (BID) | OROMUCOSAL | Status: DC
  Administered 2017-01-07 – 2017-01-09 (×6): 15 mL via OROMUCOSAL

## 2017-01-06 NOTE — ED Notes (Signed)
CBG 119 

## 2017-01-06 NOTE — Code Documentation (Signed)
Pt taken to CT by this RN and RT

## 2017-01-06 NOTE — ED Notes (Signed)
CT called by this RN, CT still not ready, should be ready shortly

## 2017-01-06 NOTE — Code Documentation (Signed)
Pt back from CT with this RN and  RT

## 2017-01-06 NOTE — ED Notes (Signed)
Dr Tressie Ellis Neuro in room

## 2017-01-06 NOTE — Consult Note (Signed)
Admission H&P    Chief Complaint: New onset seizure activity.  HPI: Ashley Velez is an 82 y.o. female with a history of previous stroke, hypertension, diabetes mellitus, hyperlipidemia and hypothyroidism, brought to the ED from a skilled nursing facility following onset of seizure activity. She has no previous history of seizures. Initially her what sugar reportedly was greater than 300. She was given 10 units of insulin. Repeat blood sugar was 69. She continued to have seizure-like activity when EMS arrived. She was given 2.5 mg of Versed. Respirations were shallow and patient was unresponsive. Respiration was assisted with regaining and nonrebreather. She was intubated after arriving in the ED. She also had markedly elevated blood pressure, 224/104 arriving in the ED. She was also noted to be hypo-thermic with a temperature of 95.5. She was started on propofol drip 30 mcg/kg/m. CT scan of the head showed remote basal ganglia and cerebellar small infarctions as well as diffuse microvascular ischemic changes. There were no acute findings. Patient began to respond well in the ED, still intubated and on peripheral propofol, including following verbal commands appropriately.  Past Medical History:  Diagnosis Date  . Diabetes mellitus without complication ([place])   . Hypertension   . Stroke [facility])     History reviewed. No pertinent surgical history.  History reviewed. No pertinent family history. Social History:  reports that she has never smoked. She does not have any smokeless tobacco history on file. She reports that she does not drink alcohol. Her drug history is not on file.  Allergies:  Allergies  Allergen Reactions  . Fosamax [Alendronate]     Noted to be an allergy on the patient's paperwork from facility, but no reaction is recorded  . Macrodantin [Nitrofurantoin]     Noted to be an allergy on the patient's paperwork from facility, but no reaction is recorded     Medications: Preadmission medications were reviewed by me.  ROS: Unavailable as patient is currently intubated.  Physical Examination: Blood pressure (!) 194/86, pulse 80, temperature (!) 95.9 F (35.5 C), temperature source Rectal, resp. rate 19, height 5' 4"  (1.626 m), weight 49.9 kg (110 lb), SpO2 100 %.  HEENT-  Normocephalic, no lesions, without obvious abnormality.  Normal external eye and conjunctiva.  Normal TM's bilaterally.  Normal auditory canals and external ears. Normal external nose, mucus membranes and septum.  Normal pharynx. Neck supple with no masses, nodes, nodules or enlargement. Cardiovascular - regular rate and rhythm, S1, S2 normal, no murmur, click, rub or gallop Lungs - chest clear, no wheezing, rales, normal symmetric air entry Abdomen - soft, non-tender; bowel sounds normal; no masses,  no organomegaly Extremities - no joint deformities, effusion, or inflammation and no edema  Neurologic Examination: Patient was intubated and on mechanical ventilation. She was awake and responded appropriately to verbal commands. She appear to be in no acute distress. Pupils were equal and reacted normally to light. Extraocular movements were intact on right left lateral gaze and were conjugate. Face was symmetrical with no focal weakness. Patient moved extremities equally with symmetrical strength. Muscle tone was normal throughout. Deep tendon reflexes were 1+ and symmetrical. Plantar responses were mute.  Results for orders placed or performed during the hospital encounter of 01/06/17 (from the past 48 hour(s))  CBG monitoring, ED     Status: Abnormal   Collection Time: 01/06/17  7:30 PM  Result Value Ref Range   Glucose-Capillary <10 (LL) 65 - 99 mg/dL  POC CBG, ED     Status:  Abnormal   Collection Time: 01/06/17  7:37 PM  Result Value Ref Range   Glucose-Capillary 257 (H) 65 - 99 mg/dL  CBG monitoring, ED     Status: Abnormal   Collection Time: 01/06/17  7:56  PM  Result Value Ref Range   Glucose-Capillary 168 (H) 65 - 99 mg/dL  Comprehensive metabolic panel     Status: Abnormal   Collection Time: 01/06/17  8:00 PM  Result Value Ref Range   Sodium 140 135 - 145 mmol/L   Potassium 3.9 3.5 - 5.1 mmol/L   Chloride 107 101 - 111 mmol/L   CO2 23 22 - 32 mmol/L   Glucose, Bld <20 (LL) 65 - 99 mg/dL    Comment: CRITICAL RESULT CALLED TO, READ BACK BY AND VERIFIED WITH: Bethena Midget RN [id number] GREEN R    BUN 24 (H) 6 - 20 mg/dL   Creatinine, Ser 1.57 (H) 0.44 - 1.00 mg/dL   Calcium 9.7 8.9 - 10.3 mg/dL   Total Protein 7.3 6.5 - 8.1 g/dL   Albumin 4.2 3.5 - 5.0 g/dL   AST 26 15 - 41 U/L   ALT 14 14 - 54 U/L   Alkaline Phosphatase 48 38 - 126 U/L   Total Bilirubin 0.4 0.3 - 1.2 mg/dL   GFR calc non Af Amer 30 (L) >60 mL/min   GFR calc Af Amer 35 (L) >60 mL/min    Comment: (NOTE) The eGFR has been calculated using the CKD EPI equation. This calculation has not been validated in all clinical situations. eGFR's persistently <60 mL/min signify possible Chronic Kidney Disease.    Anion gap 10 5 - 15  Magnesium     Status: None   Collection Time: 01/06/17  8:00 PM  Result Value Ref Range   Magnesium 1.9 1.7 - 2.4 mg/dL  Phosphorus     Status: None   Collection Time: 01/06/17  8:00 PM  Result Value Ref Range   Phosphorus 3.0 2.5 - 4.6 mg/dL  CBC     Status: Abnormal   Collection Time: 01/06/17  8:00 PM  Result Value Ref Range   WBC 12.5 (H) 4.0 - 10.5 K/uL   RBC 3.79 (L) 3.87 - 5.11 MIL/uL   Hemoglobin 10.5 (L) 12.0 - 15.0 g/dL   HCT 33.1 (L) 36.0 - 46.0 %   MCV 87.3 78.0 - 100.0 fL   MCH 27.7 26.0 - 34.0 pg   MCHC 31.7 30.0 - 36.0 g/dL   RDW 15.1 11.5 - 15.5 %   Platelets 308 150 - 400 K/uL  Ethanol     Status: None   Collection Time: 01/06/17  8:00 PM  Result Value Ref Range   Alcohol, Ethyl (B) <5 <5 mg/dL    Comment:        LOWEST DETECTABLE LIMIT FOR SERUM ALCOHOL IS 5 mg/dL FOR MEDICAL PURPOSES ONLY   Troponin I      Status: None   Collection Time: 01/06/17  8:00 PM  Result Value Ref Range   Troponin I <0.03 <0.03 ng/mL  Type and screen     Status: None   Collection Time: 01/06/17  8:00 PM  Result Value Ref Range   ABO/RH(D) A POS    Antibody Screen NEG    Sample Expiration 01/09/2017   Protime-INR     Status: None   Collection Time: 01/06/17  8:00 PM  Result Value Ref Range   Prothrombin Time 13.2 11.4 - 15.2 seconds   INR 1.00  APTT     Status: None   Collection Time: 01/06/17  8:00 PM  Result Value Ref Range   aPTT 29 24 - 36 seconds  Triglycerides     Status: Abnormal   Collection Time: 01/06/17  8:00 PM  Result Value Ref Range   Triglycerides 596 (H) <150 mg/dL  I-stat chem 8, ed     Status: Abnormal   Collection Time: 01/06/17  8:10 PM  Result Value Ref Range   Sodium 142 135 - 145 mmol/L   Potassium 3.5 3.5 - 5.1 mmol/L   Chloride 108 101 - 111 mmol/L   BUN 25 (H) 6 - 20 mg/dL   Creatinine, Ser 1.40 (H) 0.44 - 1.00 mg/dL   Glucose, Bld 149 (H) 65 - 99 mg/dL   Calcium, Ion 1.15 1.15 - 1.40 mmol/L   TCO2 21 0 - 100 mmol/L   Hemoglobin 10.9 (L) 12.0 - 15.0 g/dL   HCT 32.0 (L) 36.0 - 46.0 %  POC CBG, ED     Status: None   Collection Time: 01/06/17  8:22 PM  Result Value Ref Range   Glucose-Capillary 93 65 - 99 mg/dL  Urine rapid drug screen (hosp performed)not at [facility]     Status: Abnormal   Collection Time: 01/06/17  9:15 PM  Result Value Ref Range   Opiates NONE DETECTED NONE DETECTED   Cocaine NONE DETECTED NONE DETECTED   Benzodiazepines POSITIVE (A) NONE DETECTED   Amphetamines NONE DETECTED NONE DETECTED   Tetrahydrocannabinol NONE DETECTED NONE DETECTED   Barbiturates NONE DETECTED NONE DETECTED    Comment:        DRUG SCREEN FOR MEDICAL PURPOSES ONLY.  IF CONFIRMATION IS NEEDED FOR ANY PURPOSE, NOTIFY LAB WITHIN 5 DAYS.        LOWEST DETECTABLE LIMITS FOR URINE DRUG SCREEN Drug Class       Cutoff (ng/mL) Amphetamine      1000 Barbiturate       200 Benzodiazepine   546 Tricyclics       503 Opiates          300 Cocaine          300 THC              50   Urinalysis, Routine w reflex microscopic     Status: Abnormal   Collection Time: 01/06/17  9:15 PM  Result Value Ref Range   Color, Urine STRAW (A) YELLOW   APPearance CLEAR CLEAR   Specific Gravity, Urine 1.006 1.005 - 1.030   pH 6.0 5.0 - 8.0   Glucose, UA 50 (A) NEGATIVE mg/dL   Hgb urine dipstick NEGATIVE NEGATIVE   Bilirubin Urine NEGATIVE NEGATIVE   Ketones, ur NEGATIVE NEGATIVE mg/dL   Protein, ur 100 (A) NEGATIVE mg/dL   Nitrite NEGATIVE NEGATIVE   Leukocytes, UA NEGATIVE NEGATIVE   RBC / HPF 0-5 0 - 5 RBC/hpf   WBC, UA 0-5 0 - 5 WBC/hpf   Bacteria, UA NONE SEEN NONE SEEN   Squamous Epithelial / LPF NONE SEEN NONE SEEN  POC CBG, ED     Status: Abnormal   Collection Time: 01/06/17  9:29 PM  Result Value Ref Range   Glucose-Capillary 119 (H) 65 - 99 mg/dL   Ct Head Wo Contrast  Result Date: 01/06/2017 CLINICAL DATA:  82 year old female with seizure like activity and altered mental status. EXAM: CT HEAD WITHOUT CONTRAST TECHNIQUE: Contiguous axial images were obtained from the base of the skull through the vertex  without intravenous contrast. COMPARISON:  03/11/2015 head CT FINDINGS: Brain: No evidence of acute infarction, hemorrhage, hydrocephalus, extra-axial collection or mass lesion/mass effect. Atrophy, chronic small-vessel white matter ischemic changes and remote right basal ganglia and cerebellar infarcts again noted. Vascular: Intracranial atherosclerotic calcifications noted. Skull: Normal. Negative for fracture or focal lesion. Sinuses/Orbits: No acute finding. Other: None. IMPRESSION: No evidence of acute intracranial abnormality. Atrophy, chronic small-vessel white matter ischemic changes and remote right basal ganglia and right cerebellar infarcts. Electronically Signed   By: Margarette Canada M.D.   On: 01/06/2017 21:07   Dg Chest Portable 1 View  Result Date:  01/06/2017 CLINICAL DATA:  Post invasion, status post seizure EXAM: PORTABLE CHEST 1 VIEW COMPARISON:  CT chest dated 10/30/2016 FINDINGS: Lungs are clear.  No pleural effusion or pneumothorax. Endotracheal tube terminates 5.5 cm above the carina. Heart is normal in size. Enteric tube terminates in the gastric cardia. IMPRESSION: Endotracheal tube terminates 5.5 cm above the carina. Enteric tube terminates in the gastric cardia. Electronically Signed   By: Julian Hy M.D.   On: 01/06/2017 20:03    Assessment/Plan 82 year old lady with history of diabetes mellitus, hypertension, hyperlipidemia and previous stroke presenting with new onset seizure activity. Etiology is unclear. Patient reportedly had elevated or greater than 300 initially, then developed hypoglycemia following treatment with insulin. Seizure activity started during the period when she reportedly was hyperglycemic, which may be activating factor, then exacerbated by hyperglycemia. Blood pressure was also acutely elevated. Hypertensive encephalopathy may be a contributing factor in patient's new onset of seizure activity. Clinically she has no evidence of an acute stroke.  Recommendations: 1. MRI of the brain without contrast to rule out acute stroke as well as to rule out PRES. 2. Keppra, loading dose of 1000 mg IV followed by 500 mg every 12 hours. 3. EEG, routine adult study.  We will continue to follow this patient with you.  C.R. Nicole Kindred, [facility] [phone]  01/06/2017, 10:05 PM

## 2017-01-06 NOTE — Progress Notes (Signed)
ANTICOAGULATION CONSULT NOTE - Initial Consult  Pharmacy Consult for Lovenox Indication: VTE prophylaxis  Allergies  Allergen Reactions  . Fosamax [Alendronate]     Noted to be an allergy on the patient's paperwork from facility, but no reaction is recorded  . Macrodantin [Nitrofurantoin]     Noted to be an allergy on the patient's paperwork from facility, but no reaction is recorded    Patient Measurements: Height: 5\' 4"  (162.6 cm) Weight: 110 lb (49.9 kg) IBW/kg (Calculated) : 54.7  Vital Signs: Temp: 96.3 F (35.7 C) (06/13 2210) Temp Source: Rectal (06/13 2110) BP: 193/91 (06/13 2210) Pulse Rate: 82 (06/13 2210)  Labs:  Recent Labs  01/06/17 2000 01/06/17 2010  HGB 10.5* 10.9*  HCT 33.1* 32.0*  PLT 308  --   APTT 29  --   LABPROT 13.2  --   INR 1.00  --   CREATININE 1.57* 1.40*  TROPONINI <0.03  --     Estimated Creatinine Clearance: 25.2 mL/min (A) (by C-G formula based on SCr of 1.4 mg/dL (H)).   Medical History: Past Medical History:  Diagnosis Date  . Diabetes mellitus without complication ([place])   . Hypertension   . Stroke [facility])     Medications:  See electronic med rec  Assessment: 82 y.o. F presents with seizure type activity at [facility]. Pharmacy asked to dose Lovenox for VTE prophylaxis.   Goal of Therapy:  VTE prevention Monitor platelets by anticoagulation protocol: Yes   Plan:  Lovenox 30 mg SQ q24h Pharmacy will continue to follow on CCM rounds  Sherlon Handing, PharmD, BCPS Clinical pharmacist, pager [phone] 01/06/2017,10:54 PM

## 2017-01-06 NOTE — ED Notes (Signed)
Portable in room.  

## 2017-01-06 NOTE — Code Documentation (Signed)
CBG now 75, Dr Kathrynn Humble notifieda and ordere a half amp of d50

## 2017-01-06 NOTE — ED Notes (Addendum)
Pt opened her eyes and appears to be more alert. Pt now following commands. Pt squeezed both hands of this RN and pressed with both feet.

## 2017-01-06 NOTE — ED Notes (Signed)
This RN called CT, Ct not ready, will call when ready

## 2017-01-06 NOTE — H&P (Signed)
PULMONARY / CRITICAL CARE MEDICINE   Name: Ashley Velez MRN: [id number] DOB: 1934-11-05    ADMISSION DATE:  01/06/2017  REFERRING MD:  Kathrynn Humble  CHIEF COMPLAINT:  Seizure  HISTORY OF PRESENT ILLNESS:   Hx from chart.  82 yo female reported to have seizure type activity at [facility].  Had shaking of arms and legs.  Blood sugar was 340 and given 10 units insulin.  Then blood sugar was in 60's.  She had seizure type activity and given versed.  Then became unresponsive and intubated.  Blood pressure was in 200's.  Neurology was called to assess patient.  She started waking up and following commands in ER.  She was then started on diprivan.  PAST MEDICAL HISTORY :  She  has a past medical history of Diabetes mellitus without complication ([place]); Hypertension; and Stroke [facility]).  PAST SURGICAL HISTORY: Unable to obtain   Allergies  Allergen Reactions  . Fosamax [Alendronate]     Noted to be an allergy on the patient's paperwork from facility, but no reaction is recorded  . Macrodantin [Nitrofurantoin]     Noted to be an allergy on the patient's paperwork from facility, but no reaction is recorded    No current facility-administered medications on file prior to encounter.    Current Outpatient Prescriptions on File Prior to Encounter  Medication Sig  . carvedilol (COREG) 12.5 MG tablet Take 12.5 mg by mouth 2 (two) times daily.   . clopidogrel (PLAVIX) 75 MG tablet Take 75 mg by mouth daily.   . felodipine (PLENDIL) 5 MG 24 hr tablet Take 5 mg by mouth daily.   . metFORMIN (GLUCOPHAGE) 500 MG tablet Take 500 mg by mouth at bedtime.   Marland Kitchen OLANZapine (ZYPREXA) 15 MG tablet Take 15 mg by mouth at bedtime.   . simvastatin (ZOCOR) 20 MG tablet Take 20 mg by mouth at bedtime.  . valsartan (DIOVAN) 320 MG tablet Take 320 mg by mouth daily.  . Vitamin D, Ergocalciferol, (DRISDOL) 50000 UNITS CAPS capsule Take 50,000 Units by mouth once a week. WEDNESDAYS  . ONE TOUCH ULTRA TEST test strip   .  ONETOUCH DELICA LANCETS FINE MISC 2 (two) times daily. for testing    FAMILY HISTORY:  Unable to obtain   SOCIAL HISTORY: She  reports that she has never smoked. She does not have any smokeless tobacco history on file. She reports that she does not drink alcohol.  REVIEW OF SYSTEMS:   Unable to obtain  VITAL SIGNS: BP (!) 193/91   Pulse 82   Temp (!) 96.3 F (35.7 C)   Resp (!) 21   Ht 5\' 4"  (1.626 m)   Wt 110 lb (49.9 kg)   SpO2 100%   BMI 18.88 kg/m   HEMODYNAMICS:    VENTILATOR SETTINGS: Vent Mode: PRVC FiO2 (%):  [100 %] 100 % Set Rate:  [18 bmp] 18 bmp Vt Set:  [470 mL] 470 mL PEEP:  [5 cmH20] 5 cmH20 Plateau Pressure:  [15 cmH20] 15 cmH20  INTAKE / OUTPUT: No intake/output data recorded.  PHYSICAL EXAMINATION: General:  In ER Neuro: RASS -1, opens eyes spontaneously, follows simple commands HEENT:  Pupils reactive, ETT in place Cardiovascular: regular, no murmur Lungs: no wheeze Abdomen:  Soft, non tender Musculoskeletal:  No edema Skin: no rashes  LABS:  BMET  Recent Labs Lab 01/06/17 2000 01/06/17 2010  NA 140 142  K 3.9 3.5  CL 107 108  CO2 23  --   BUN 24*  25*  CREATININE 1.57* 1.40*  GLUCOSE <20* 149*    Electrolytes  Recent Labs Lab 01/06/17 2000  CALCIUM 9.7  MG 1.9  PHOS 3.0    CBC  Recent Labs Lab 01/06/17 2000 01/06/17 2010  WBC 12.5*  --   HGB 10.5* 10.9*  HCT 33.1* 32.0*  PLT 308  --     Coag's  Recent Labs Lab 01/06/17 2000  APTT 29  INR 1.00    Sepsis Markers No results for input(s): LATICACIDVEN, PROCALCITON, O2SATVEN in the last 168 hours.  ABG No results for input(s): PHART, PCO2ART, PO2ART in the last 168 hours.  Liver Enzymes  Recent Labs Lab 01/06/17 2000  AST 26  ALT 14  ALKPHOS 48  BILITOT 0.4  ALBUMIN 4.2    Cardiac Enzymes  Recent Labs Lab 01/06/17 2000  TROPONINI <0.03    Glucose  Recent Labs Lab 01/06/17 1930 01/06/17 1937 01/06/17 1956 01/06/17 2022  01/06/17 2129 01/06/17 2227  GLUCAP <10* 257* 168* 93 119* 113*    Imaging Ct Head Wo Contrast  Result Date: 01/06/2017 CLINICAL DATA:  82 year old female with seizure like activity and altered mental status. EXAM: CT HEAD WITHOUT CONTRAST TECHNIQUE: Contiguous axial images were obtained from the base of the skull through the vertex without intravenous contrast. COMPARISON:  03/11/2015 head CT FINDINGS: Brain: No evidence of acute infarction, hemorrhage, hydrocephalus, extra-axial collection or mass lesion/mass effect. Atrophy, chronic small-vessel white matter ischemic changes and remote right basal ganglia and cerebellar infarcts again noted. Vascular: Intracranial atherosclerotic calcifications noted. Skull: Normal. Negative for fracture or focal lesion. Sinuses/Orbits: No acute finding. Other: None. IMPRESSION: No evidence of acute intracranial abnormality. Atrophy, chronic small-vessel white matter ischemic changes and remote right basal ganglia and right cerebellar infarcts. Electronically Signed   By: Margarette Canada M.D.   On: 01/06/2017 21:07   Dg Chest Portable 1 View  Result Date: 01/06/2017 CLINICAL DATA:  Post invasion, status post seizure EXAM: PORTABLE CHEST 1 VIEW COMPARISON:  CT chest dated 10/30/2016 FINDINGS: Lungs are clear.  No pleural effusion or pneumothorax. Endotracheal tube terminates 5.5 cm above the carina. Heart is normal in size. Enteric tube terminates in the gastric cardia. IMPRESSION: Endotracheal tube terminates 5.5 cm above the carina. Enteric tube terminates in the gastric cardia. Electronically Signed   By: Julian Hy M.D.   On: 01/06/2017 20:03     STUDIES:  CT head 6/13 >> atrophy, chronic small vessel ischemic changes, remote Rt basal ganglia and Rt cerebellar infarcts  CULTURES:  ANTIBIOTICS:  SIGNIFICANT EVENTS: 6/13 Admit, neuro consulted  LINES/TUBES: ETT 6/13 >>   DISCUSSION: 82 yo female with seizure in setting of HTN emergency, and  fluctuating blood sugars.  Intubated for airway protection after becoming hypoglycemic from insulin injection and versed to control seizure.  ASSESSMENT / PLAN:  Acute hypoxic respiratory failure with compromised airway. - continue vent support for now - likely can extubate soon once neuro status stable  HTN emergency. Hx of HTN, HLD. - labetalol, hydralazine prn - coreg, zocor, diovan  Seizure in setting of HTN emergency, and hypoglycemia. Hx of CVA. - neurology consulted in ER - plavix  Fluctuating blood sugar with hx of DM. Hx of hypothyroidism. - SSI - continue levothyroxine  Acute metabolic encephalopathy. - RASS goal 0  Elevated triglyceride. - d/c diprivan  DVT prophylaxis - lovenox SUP - protonix Nutrition - NPO Goals of care - full code  No family available  CC time 74 minutes  Chesley Mires, MD  [place] 01/06/2017, 10:48 PM Pager:  [phone] After 3pm call: [phone]

## 2017-01-06 NOTE — ED Notes (Signed)
Pt's daughter and son in law at bedside. Both informed of situation. Pt's daughter states that pt has never been on insulin, just oral medications for her DM. Pt resting, still hypertensive, other VSS.

## 2017-01-06 NOTE — ED Triage Notes (Addendum)
Per EMS, pt from SNF, called out for seizure like activity, seizing on scene with EMS with arms/legs shaking. Pt's cbg was 340 with facility and given 10units of insulin, then when seizure activity began cgg 69. Pt given 2.5 versed and then pt began to have shallow respirations and unresponsive. Pt bagged with NRB. Blood pressures all over 459 systolic with EMS. Full code

## 2017-01-06 NOTE — ED Notes (Signed)
CBG 257 now, pt still unresponsive and being bagged.

## 2017-01-06 NOTE — Progress Notes (Signed)
Decrease respiratory rate to 14 and Fio2 to 40% post ABG result.

## 2017-01-06 NOTE — ED Notes (Signed)
Pt resting in bed. Pt appears to be in no distress under sedation.

## 2017-01-07 ENCOUNTER — Inpatient Hospital Stay (HOSPITAL_COMMUNITY): Payer: Medicare Other

## 2017-01-07 ENCOUNTER — Encounter (HOSPITAL_COMMUNITY): Payer: Self-pay | Admitting: *Deleted

## 2017-01-07 DIAGNOSIS — G9340 Encephalopathy, unspecified: Secondary | ICD-10-CM

## 2017-01-07 DIAGNOSIS — R509 Fever, unspecified: Secondary | ICD-10-CM

## 2017-01-07 LAB — GLUCOSE, CAPILLARY
GLUCOSE-CAPILLARY: 168 mg/dL — AB (ref 65–99)
GLUCOSE-CAPILLARY: 139 mg/dL — AB (ref 65–99)
GLUCOSE-CAPILLARY: 134 mg/dL — AB (ref 65–99)
GLUCOSE-CAPILLARY: 90 mg/dL (ref 65–99)
Glucose-Capillary: 151 mg/dL — ABNORMAL HIGH (ref 65–99)
Glucose-Capillary: 139 mg/dL — ABNORMAL HIGH (ref 65–99)
Glucose-Capillary: 171 mg/dL — ABNORMAL HIGH (ref 65–99)

## 2017-01-07 LAB — CBC
HCT: 34.3 % — ABNORMAL LOW (ref 36.0–46.0)
HEMOGLOBIN: 11 g/dL — AB (ref 12.0–15.0)
MCH: 27.7 pg (ref 26.0–34.0)
MCHC: 32.1 g/dL (ref 30.0–36.0)
MCV: 86.4 fL (ref 78.0–100.0)
Platelets: 299 10*3/uL (ref 150–400)
RBC: 3.97 MIL/uL (ref 3.87–5.11)
RDW: 14.8 % (ref 11.5–15.5)
WBC: 16.6 10*3/uL — ABNORMAL HIGH (ref 4.0–10.5)

## 2017-01-07 LAB — COMPREHENSIVE METABOLIC PANEL
ALBUMIN: 3.5 g/dL (ref 3.5–5.0)
ALK PHOS: 43 U/L (ref 38–126)
ALT: 14 U/L (ref 14–54)
AST: 21 U/L (ref 15–41)
Anion gap: 10 (ref 5–15)
BUN: 19 mg/dL (ref 6–20)
CALCIUM: 9.1 mg/dL (ref 8.9–10.3)
CO2: 23 mmol/L (ref 22–32)
CREATININE: 1.5 mg/dL — AB (ref 0.44–1.00)
Chloride: 107 mmol/L (ref 101–111)
GFR calc Af Amer: 37 mL/min — ABNORMAL LOW (ref >60)
GFR calc non Af Amer: 32 mL/min — ABNORMAL LOW (ref >60)
GLUCOSE: 162 mg/dL — AB (ref 65–99)
Potassium: 3.6 mmol/L (ref 3.5–5.1)
SODIUM: 140 mmol/L (ref 135–145)
Total Bilirubin: 0.7 mg/dL (ref 0.3–1.2)
Total Protein: 6.1 g/dL — ABNORMAL LOW (ref 6.5–8.1)

## 2017-01-07 LAB — STREP PNEUMONIAE URINARY ANTIGEN: STREP PNEUMO URINARY ANTIGEN: NEGATIVE

## 2017-01-07 LAB — MRSA PCR SCREENING: MRSA by PCR: NEGATIVE

## 2017-01-07 LAB — MAGNESIUM: MAGNESIUM: 1.6 mg/dL — AB (ref 1.7–2.4)

## 2017-01-07 LAB — PROCALCITONIN: Procalcitonin: <0.1 ng/mL

## 2017-01-07 LAB — CBG MONITORING, ED: Glucose-Capillary: 118 mg/dL — ABNORMAL HIGH (ref 65–99)

## 2017-01-07 MED ORDER — POTASSIUM CHLORIDE 20 MEQ/15ML (10%) PO SOLN
40.0000 meq | Freq: Once | ORAL | Status: AC
Start: 2017-01-07 — End: 2017-01-07
  Administered 2017-01-07: 40 meq
  Filled 2017-01-07: qty 30

## 2017-01-07 MED ORDER — HYDRALAZINE HCL 20 MG/ML IJ SOLN
10.0000 mg | INTRAMUSCULAR | Status: DC | PRN
  Administered 2017-01-07 (×2): 20 mg via INTRAVENOUS
  Administered 2017-01-07: 10 mg via INTRAVENOUS
  Administered 2017-01-08 (×2): 20 mg via INTRAVENOUS
  Filled 2017-01-07 (×4): qty 1

## 2017-01-07 MED ORDER — MAGNESIUM SULFATE 50 % IJ SOLN
3.0000 g | Freq: Once | INTRAVENOUS | Status: AC
  Administered 2017-01-07: 3 g via INTRAVENOUS
  Filled 2017-01-07 (×2): qty 6

## 2017-01-07 MED ORDER — PROPOFOL 1000 MG/100ML IV EMUL: 5.0000 ug/kg/min | INTRAVENOUS | Status: DC

## 2017-01-07 MED ORDER — PIPERACILLIN-TAZOBACTAM 3.375 G IVPB
3.3750 g | Freq: Three times a day (TID) | INTRAVENOUS | Status: DC
  Administered 2017-01-07 – 2017-01-08 (×3): 3.375 g via INTRAVENOUS
  Filled 2017-01-07 (×4): qty 50

## 2017-01-07 MED ORDER — PROPOFOL 1000 MG/100ML IV EMUL
INTRAVENOUS | Status: AC
  Administered 2017-01-07: 02:00:00
  Filled 2017-01-07: qty 100

## 2017-01-07 MED ORDER — MIDAZOLAM HCL 2 MG/2ML IJ SOLN: 1.0000 mg | INTRAMUSCULAR | Status: DC | PRN

## 2017-01-07 MED ORDER — MIDAZOLAM HCL 2 MG/2ML IJ SOLN
1.0000 mg | INTRAMUSCULAR | Status: DC | PRN
  Administered 2017-01-07: 2 mg via INTRAVENOUS
  Filled 2017-01-07: qty 2

## 2017-01-07 MED ORDER — ACETAMINOPHEN 160 MG/5ML PO SOLN
650.0000 mg | Freq: Four times a day (QID) | ORAL | Status: DC | PRN
  Administered 2017-01-07 (×3): 650 mg
  Filled 2017-01-07 (×3): qty 20.3

## 2017-01-07 MED ORDER — MIDAZOLAM HCL 2 MG/2ML IJ SOLN: 4.0000 mg | Freq: Once | INTRAMUSCULAR | Status: DC

## 2017-01-07 MED ORDER — HEPARIN SODIUM (PORCINE) 5000 UNIT/ML IJ SOLN
5000.0000 [IU] | Freq: Three times a day (TID) | INTRAMUSCULAR | Status: DC
  Administered 2017-01-07 – 2017-01-12 (×16): 5000 [IU] via SUBCUTANEOUS
  Filled 2017-01-07 (×16): qty 1

## 2017-01-07 MED ORDER — ENOXAPARIN SODIUM 30 MG/0.3ML ~~LOC~~ SOLN: 30.0000 mg | SUBCUTANEOUS | Status: DC

## 2017-01-07 MED ORDER — FENTANYL CITRATE (PF) 100 MCG/2ML IJ SOLN
50.0000 ug | INTRAMUSCULAR | Status: DC | PRN
  Administered 2017-01-07: 50 ug via INTRAVENOUS
  Filled 2017-01-07: qty 2

## 2017-01-07 MED ORDER — FENTANYL CITRATE (PF) 100 MCG/2ML IJ SOLN
25.0000 ug | INTRAMUSCULAR | Status: DC | PRN
  Administered 2017-01-07: 50 ug via INTRAVENOUS
  Administered 2017-01-07: 25 ug via INTRAVENOUS
  Administered 2017-01-07: 50 ug via INTRAVENOUS
  Filled 2017-01-07 (×3): qty 2

## 2017-01-07 MED ORDER — FENTANYL CITRATE (PF) 100 MCG/2ML IJ SOLN: 50.0000 ug | INTRAMUSCULAR | Status: DC | PRN

## 2017-01-07 NOTE — Progress Notes (Signed)
Pt placed back on full support on vent due to decreased tidal volume and respiratory rate.  RT will continue to monitor.

## 2017-01-07 NOTE — ED Notes (Signed)
Pt's dentures given to pt's daughter in denture cup.

## 2017-01-07 NOTE — Progress Notes (Signed)
Bedside EEG completed, results pending. 

## 2017-01-07 NOTE — Progress Notes (Signed)
eLink Physician-Brief Progress Note Patient Name: Ashley Velez DOB: 30-May-1935 MRN: [id number]   Date of Service  01/07/2017  HPI/Events of Note  Hypertension Sedation needs  eICU Interventions  Change intermittent sedation protocol to standard PAD protocol Increase prn hydralazine Add SCD's     Intervention Category Major Interventions: Hypertension - evaluation and management Intermediate Interventions: Change in mental status - evaluation and management  Simonne Maffucci 01/07/2017, 4:01 AM

## 2017-01-07 NOTE — ED Notes (Signed)
This Rn spoke with Dr Lake Bells and in formed him of temp 101.3 and BP. He is going to order blood cultures. 1 set of blood cultures was drawn with IV start when pt came in.

## 2017-01-07 NOTE — Progress Notes (Signed)
Pharmacy Antibiotic Note  Ashley Velez is a 82 y.o. female admitted on 01/06/2017 with pneumonia.  Pharmacy has been consulted for zosyn dosing. TMax is 101.5 and WBC is elevated at 16.6. SCr is elevated at 1.5.  Plan: Zosyn 3.375gm IV Q8H (4 hr inf) F/u renal fxn, C&S, clinical status   Height: 5\' 3"  (160 cm) Weight: 110 lb 7.2 oz (50.1 kg) IBW/kg (Calculated) : 52.4  Temp (24hrs), Avg:99.5 F (37.5 C), Min:95.9 F (35.5 C), Max:101.5 F (38.6 C)   Recent Labs Lab 01/06/17 2000 01/06/17 2010 01/07/17 0230  WBC 12.5*  --  16.6*  CREATININE 1.57* 1.40* 1.50*    Estimated Creatinine Clearance: 23.7 mL/min (A) (by C-G formula based on SCr of 1.5 mg/dL (H)).    Allergies  Allergen Reactions  . Fosamax [Alendronate]     Noted to be an allergy on the patient's paperwork from facility, but no reaction is recorded  . Macrodantin [Nitrofurantoin]     Noted to be an allergy on the patient's paperwork from facility, but no reaction is recorded    Antimicrobials this admission: Zosyn 6/14>>  Dose adjustments this admission: N/A  Microbiology results: Pending  Thank you for allowing pharmacy to be a part of this patient's care.  Darla Mcdonald, Rande Lawman 01/07/2017 8:09 AM

## 2017-01-07 NOTE — Progress Notes (Addendum)
[place] [facility] Attending Note  ADMISSION DATE:  01/06/2017  REFERRING MD:  Kathrynn Humble  CHIEF COMPLAINT:  Seizure  Presenting HPI:  82 y.o. female reported to have seizure type activity at [facility].  Had shaking of arms and legs.  Blood sugar was 340 and given 10 units insulin.  Then blood sugar was in 60's.  She had seizure type activity and given versed.  Then became unresponsive and intubated.  Blood pressure was in 200's.  Neurology was called to assess patient.  She started waking up and following commands in ER.  She was then started on diprivan.  Subjective:  Patient with high triglycerides and was switched to intermittent sedation overnight. Patient was febrile overnight and blood & tracheal aspirate cultures were ordered. Patient was given fentanyl this morning due to some agitation and attempting to get out of bed.  Review of Systems:  Unable to obtain given intubation.   Vent Mode: PRVC FiO2 (%):  [40 %-100 %] 40 % Set Rate:  [14 bmp-18 bmp] 14 bmp Vt Set:  [470 mL] 470 mL PEEP:  [5 cmH20] 5 cmH20 Plateau Pressure:  [15 cmH20-17 cmH20] 17 cmH20  Temp:  [95.9 F (35.5 C)-101.5 F (38.6 C)] 100.4 F (38 C) (06/14 0600) Pulse Rate:  [73-95] 74 (06/14 0600) Resp:  [14-26] 14 (06/14 0600) BP: (106-224)/(47-123) 125/51 (06/14 0600) SpO2:  [100 %] 100 % (06/14 0600) FiO2 (%):  [40 %-100 %] 40 % (06/14 0415) Weight:  [110 lb (49.9 kg)-110 lb 7.2 oz (50.1 kg)] 110 lb 7.2 oz (50.1 kg) (06/14 0221)  General:  No family at bedside. Intubated. No distress. Integument:  Warm & dry. No rash on exposed skin. HEENT:  Moist mucus memebranes. No scleral icterus. Endotracheal tube in place. Neurological:  Pupils symmetric. Following commands and moving extremities equally bilaterally.  Musculoskeletal:  No joint effusion or erythema appreciated. Symmetric muscle bulk. Able to touch chin to chest with no apparent pain. Pulmonary:  Symmetric chest wall rise on ventilator. Coarse  breath sounds bilaterally. Cardiovascular:  Regular rate & rhythm. No appreciable JVD. Normal S1 & S2. Telemetry:  Sinus rhythm. Abdomen:  Soft. Nondistended. Normoactive bowel sounds.  LINES/TUBES: OETT 6/13 >>> Foley 6/13 >>> OGT 6/13 >>> PIV  CBC Latest Ref Rng & Units 01/07/2017 01/06/2017 01/06/2017  WBC 4.0 - 10.5 K/uL 16.6(H) - 12.5(H)  Hemoglobin 12.0 - 15.0 g/dL 11.0(L) 10.9(L) 10.5(L)  Hematocrit 36.0 - 46.0 % 34.3(L) 32.0(L) 33.1(L)  Platelets 150 - 400 K/uL 299 - 308   BMP Latest Ref Rng & Units 01/07/2017 01/06/2017 01/06/2017  Glucose 65 - 99 mg/dL 162(H) 149(H) <20(LL)  BUN 6 - 20 mg/dL 19 25(H) 24(H)  Creatinine 0.44 - 1.00 mg/dL 1.50(H) 1.40(H) 1.57(H)  Sodium 135 - 145 mmol/L 140 142 140  Potassium 3.5 - 5.1 mmol/L 3.6 3.5 3.9  Chloride 101 - 111 mmol/L 107 108 107  CO2 22 - 32 mmol/L 23 - 23  Calcium 8.9 - 10.3 mg/dL 9.1 - 9.7   Hepatic Function Latest Ref Rng & Units 01/07/2017 01/06/2017  Total Protein 6.5 - 8.1 g/dL 6.1(L) 7.3  Albumin 3.5 - 5.0 g/dL 3.5 4.2  AST 15 - 41 U/L 21 26  ALT 14 - 54 U/L 14 14  Alk Phosphatase 38 - 126 U/L 43 48  Total Bilirubin 0.3 - 1.2 mg/dL 0.7 0.4    IMAGING/STUDIES: UDS 6/13:  Benzos Positive CT HEAD W/O 6/13:  No evidence of acute intracranial abnormality. Atrophy, chronic small-vessel  white matter ischemic changes and remote right basal ganglia and right cerebellar infarcts. PORT CXR 6/14:  Personally reviewed by me. Questionable right midlung hazy opacity. Endotracheal tube in good position. Enteric feeding tube coursing below diaphragm with tip visualized in gastric fundus.   MICROBIOLOGY: MRSA PCR 6/14 >>> Blood Cultures x2 6/14 >>>  ANTIBIOTICS: None.  SIGNIFICANT EVENTS: 06/13 - Admit & neurology consulted 06/14 - FUO >> cultured overnight. Zosyn started during rounds.   ASSESSMENT/PLAN:  82 y.o. female presenting with altered mentation and seizure activity. Patient received some sedation this morning and had  insufficient minute ventilation on spontaneous breathing trial.Patient's febrile night was quite probably secondary to aspiration pneumonia versus pneumonitis. No signs of meningitis.  1. Acute encephalopathy: Likely due to post ictal state and medication. Improving. Continuing to use limited sedation with IV push medication. 2. Seizure: Appreciate input from neurology. Antiepileptic drugs as per neurology. EEG pending. Continuing seizure precautions. 3. Hypoglycemia: Resolved. Continuing Accu-Cheks every 4 hours with sliding scale insulin per moderate algorithm. 4. Hypertensive emergency: Continuing Coreg and Avapro. Monitoring vitals per unit protocol. Hydralazine IV when necessary. 5. FUO/right lower lobe pneumonia versus pneumonitis: Tracheal aspirate culture pending. Trending Procalcitonin problem. Starting empiric Zosyn with pharmacy to assist with dosing. Blood cultures pending. Checking urine streptococcal and legionella antigens. 6. Diabetes mellitus: Holding home metformin. Continuing Accu-Cheks and sliding scale insulin as above. 7. History of stroke: Continuing Plavix. 8. Hypomagnesemia: Replacing with magnesium sulfate 3 g IV. Repeat magnesium level with a.m. labs. 9. Relative hypokalemia: Replacing with KCl 40 mEq via tube 1. Repeat electrolyte panel in the morning.  10. Hyperlipidemia: Continuing Zocor. 11. Hypertriglyceridemia: Avoiding dipper Van infusion. 12. Hypothyroidism: Continuing Synthroid.  Prophylaxis:  SCDs & Protonix via tube daily.Switching from anoxia apparent to heparin subcutaneous every 8 hours.  Diet:  NPO.  Holding on tube feeds pending possible extubation. Code Status:  Full Code as per previous physician discussions. Disposition:  Patient to remain in ICU while intubated. Family Update:  No family at bedside at the time of my rounds.  I have personally spent a total of 31 minutes of critical care time today caring for the patient & reviewing the patient's  electronic medical record.  Sonia Baller Ashok Cordia, M.D. [facility] Pulmonary & Critical Care Pager:  [phone] After 3pm or if no response, call [phone] 6:55 AM 01/07/17

## 2017-01-07 NOTE — ED Provider Notes (Signed)
[facility] DEPT Provider Note   CSN: [id number] Arrival date & time: 01/06/17  1927     History   Chief Complaint Chief Complaint  Patient presents with  . Seizures    HPI Ashley Velez is a 82 y.o. female.  HPI LEVEL 5 CAVEAT FOR UNRESPONSIVE PATIENT  82 yo female reported to have seizure type activity at nursing home per paramedics. Per nursing home staff to paramedics, pt had shaking of arms and legs and pt became unresponsive.  Blood sugar was checked, and pt had CBG 340 and thus given 10 units insulin by nursing home.  Blood sugar was in 60's on repeat check when paramedics arrived. They noted continued shaking, and gave patient versed 2.5 mg. Pt arrives to the ER with agonal respirations requiring bagging support.  Blood pressure was in 200's. Pt's repeat CBG in the ER was low - she was given 1 amp of d50 with no response within the first 2-3 minutes, despite repeat CBG being in the 250s.   Past Medical History:  Diagnosis Date  . Diabetes mellitus without complication ([place])   . Hypertension   . Stroke [facility])     Patient Active Problem List   Diagnosis Date Noted  . Seizure ([place]) 01/06/2017    History reviewed. No pertinent surgical history.  OB History    No data available       Home Medications    Prior to Admission medications   Medication Sig Start Date End Date Taking? Authorizing Provider  Calcium Carb-Cholecalciferol (CALCIUM 500 + D3 PO) Take 1 tablet by mouth daily.   Yes [provider]  carvedilol (COREG) 12.5 MG tablet Take 12.5 mg by mouth 2 (two) times daily.  08/14/15  Yes [provider]  clopidogrel (PLAVIX) 75 MG tablet Take 75 mg by mouth daily.  08/22/15  Yes [provider]  cyanocobalamin (,VITAMIN B-12,) 1000 MCG/ML injection Inject 1,000 mcg into the muscle every 30 (thirty) days. ON THE 30TH OF THE MONTH   Yes [provider]  felodipine (PLENDIL) 5 MG 24 hr tablet Take 5 mg by mouth daily.   08/30/15  Yes [provider]  Ferrous Sulfate (IRON) 28 MG TABS Take 28 mg by mouth daily.   Yes [provider]  fluticasone (FLONASE ALLERGY RELIEF) 50 MCG/ACT nasal spray Place 2 sprays into both nostrils daily.   Yes [provider]  glipiZIDE-metformin (METAGLIP) 2.5-500 MG tablet Take 1 tablet by mouth 2 (two) times daily.   Yes [provider]  insulin lispro (HUMALOG) 100 UNIT/ML injection Inject into the skin 2 (two) times daily. "PER HOUSE SLIDING SCALE PROTOCOL"   Yes [provider]  levothyroxine (LEVOXYL) 50 MCG tablet Take 50 mcg by mouth daily before breakfast.   Yes [provider]  loratadine (CLARITIN) 10 MG tablet Take 10 mg by mouth daily.   Yes [provider]  metFORMIN (GLUCOPHAGE) 500 MG tablet Take 500 mg by mouth at bedtime.  07/25/15  Yes [provider]  OLANZapine (ZYPREXA) 15 MG tablet Take 15 mg by mouth at bedtime.  08/14/15  Yes [provider]  simvastatin (ZOCOR) 20 MG tablet Take 20 mg by mouth at bedtime. 08/10/15  Yes [provider]  valsartan (DIOVAN) 320 MG tablet Take 320 mg by mouth daily. 06/05/15  Yes [provider]  Vitamin D, Ergocalciferol, (DRISDOL) 50000 UNITS CAPS capsule Take 50,000 Units by mouth once a week. [facility] 12/31/14  Yes [provider]  ONE  TOUCH ULTRA TEST test strip  07/25/15   [provider]  [facility] DELICA LANCETS FINE MISC 2 (two) times daily. for testing 07/25/15   [provider]    Family History History reviewed. No pertinent family history.  Social History Social History  Substance Use Topics  . Smoking status: Never Smoker  . Smokeless tobacco: Not on file  . Alcohol use No     Allergies   Fosamax [alendronate] and Macrodantin [nitrofurantoin]   Review of Systems Review of Systems  Unable to perform ROS: Patient unresponsive     Physical Exam Updated Vital Signs BP (!) 143/63    Pulse 72   Temp 99.1 F (37.3 C)   Resp 19   Ht 5' 3"  (1.6 m)   Wt 50.1 kg (110 lb 7.2 oz)   SpO2 100%   BMI 19.57 kg/m   Physical Exam  Constitutional: She appears well-developed and well-nourished.  HENT:  Head: Normocephalic and atraumatic.  Eyes: Pupils are equal, round, and reactive to light.  Neck: Neck supple.  Cardiovascular: Normal rate, regular rhythm and normal heart sounds.   Pulmonary/Chest: She is in respiratory distress.  Abdominal: Soft. She exhibits no distension. There is no tenderness. There is no rebound and no guarding.  Neurological:  GCS - 3  Skin: Skin is warm and dry.  Nursing note and vitals reviewed.    ED Treatments / Results  Labs (all labs ordered are listed, but only abnormal results are displayed) Labs Reviewed  COMPREHENSIVE METABOLIC PANEL - Abnormal; Notable for the following:       Result Value   Glucose, Bld <20 (*)    BUN 24 (*)    Creatinine, Ser 1.57 (*)    GFR calc non Af Amer 30 (*)    GFR calc Af Amer 35 (*)    All other components within normal limits  CBC - Abnormal; Notable for the following:    WBC 12.5 (*)    RBC 3.79 (*)    Hemoglobin 10.5 (*)    HCT 33.1 (*)    All other components within normal limits  RAPID URINE DRUG SCREEN, HOSP PERFORMED - Abnormal; Notable for the following:    Benzodiazepines POSITIVE (*)    All other components within normal limits  TRIGLYCERIDES - Abnormal; Notable for the following:    Triglycerides 596 (*)    All other components within normal limits  URINALYSIS, ROUTINE W REFLEX MICROSCOPIC - Abnormal; Notable for the following:    Color, Urine STRAW (*)    Glucose, UA 50 (*)    Protein, ur 100 (*)    All other components within normal limits  COMPREHENSIVE METABOLIC PANEL - Abnormal; Notable for the following:    Glucose, Bld 162 (*)    Creatinine, Ser 1.50 (*)    Total Protein 6.1 (*)    GFR calc non Af Amer 32 (*)    GFR calc Af Amer 37 (*)    All other components within  normal limits  CBC - Abnormal; Notable for the following:    WBC 16.6 (*)    Hemoglobin 11.0 (*)    HCT 34.3 (*)    All other components within normal limits  MAGNESIUM - Abnormal; Notable for the following:    Magnesium 1.6 (*)    All other components within normal limits  GLUCOSE, CAPILLARY - Abnormal; Notable for the following:    Glucose-Capillary 151 (*)    All other components within normal limits  GLUCOSE,  CAPILLARY - Abnormal; Notable for the following:    Glucose-Capillary 168 (*)    All other components within normal limits  GLUCOSE, CAPILLARY - Abnormal; Notable for the following:    Glucose-Capillary 139 (*)    All other components within normal limits  CBG MONITORING, ED - Abnormal; Notable for the following:    Glucose-Capillary <10 (*)    All other components within normal limits  I-STAT CHEM 8, ED - Abnormal; Notable for the following:    BUN 25 (*)    Creatinine, Ser 1.40 (*)    Glucose, Bld 149 (*)    Hemoglobin 10.9 (*)    HCT 32.0 (*)    All other components within normal limits  CBG MONITORING, ED - Abnormal; Notable for the following:    Glucose-Capillary 257 (*)    All other components within normal limits  CBG MONITORING, ED - Abnormal; Notable for the following:    Glucose-Capillary 168 (*)    All other components within normal limits  CBG MONITORING, ED - Abnormal; Notable for the following:    Glucose-Capillary 119 (*)    All other components within normal limits  CBG MONITORING, ED - Abnormal; Notable for the following:    Glucose-Capillary 113 (*)    All other components within normal limits  CBG MONITORING, ED - Abnormal; Notable for the following:    Glucose-Capillary 121 (*)    All other components within normal limits  CBG MONITORING, ED - Abnormal; Notable for the following:    Glucose-Capillary 118 (*)    All other components within normal limits  MRSA PCR SCREENING  CULTURE, BLOOD (ROUTINE X 2)  CULTURE, BLOOD (ROUTINE X 2)    CULTURE, RESPIRATORY (NON-EXPECTORATED)  MAGNESIUM  PHOSPHORUS  ETHANOL  TROPONIN I  PROTIME-INR  APTT  PROCALCITONIN  STREP PNEUMONIAE URINARY ANTIGEN  LEGIONELLA PNEUMOPHILA SEROGP 1 UR AG  CBG MONITORING, ED  TYPE AND SCREEN  ABO/RH    EKG  EKG Interpretation  Date/Time:  Wednesday January 06 2017 19:33:04 EDT Ventricular Rate:  88 PR Interval:    QRS Duration: 109 QT Interval:  380 QTC Calculation: 460 R Axis:   32 Text Interpretation:  Sinus rhythm Biatrial enlargement No acute changes No significant change since last tracing Confirmed by Varney Biles ([id number]) on 01/06/2017 7:57:18 PM       Radiology Ct Head Wo Contrast  Result Date: 01/06/2017 CLINICAL DATA:  83 year old female with seizure like activity and altered mental status. EXAM: CT HEAD WITHOUT CONTRAST TECHNIQUE: Contiguous axial images were obtained from the base of the skull through the vertex without intravenous contrast. COMPARISON:  03/11/2015 head CT FINDINGS: Brain: No evidence of acute infarction, hemorrhage, hydrocephalus, extra-axial collection or mass lesion/mass effect. Atrophy, chronic small-vessel white matter ischemic changes and remote right basal ganglia and cerebellar infarcts again noted. Vascular: Intracranial atherosclerotic calcifications noted. Skull: Normal. Negative for fracture or focal lesion. Sinuses/Orbits: No acute finding. Other: None. IMPRESSION: No evidence of acute intracranial abnormality. Atrophy, chronic small-vessel white matter ischemic changes and remote right basal ganglia and right cerebellar infarcts. Electronically Signed   By: Margarette Canada M.D.   On: 01/06/2017 21:07   Dg Chest Port 1 View  Result Date: 01/07/2017 CLINICAL DATA:  Respiratory failure, history of CVA, hypertension, diabetes. EXAM: PORTABLE CHEST 1 VIEW COMPARISON:  Chest x-ray of January 06, 2017 FINDINGS: The lungs are well-expanded and clear. The heart and pulmonary vascularity are normal. There is  calcification in the wall of the aortic arch.  There is no pleural effusion or pneumothorax. The observed portions of the bony thorax exhibit no acute abnormalities. The endotracheal tube tip lies 3.8 cm above the carina. The esophagogastric tube tip and proximal port lie in the gastric cardia. IMPRESSION: Intubated patient.  No acute cardiopulmonary abnormality. Thoracic aortic atherosclerosis. Electronically Signed   By: David  Martinique M.D.   On: 01/07/2017 07:44   Dg Chest Portable 1 View  Result Date: 01/06/2017 CLINICAL DATA:  Post invasion, status post seizure EXAM: PORTABLE CHEST 1 VIEW COMPARISON:  CT chest dated 10/30/2016 FINDINGS: Lungs are clear.  No pleural effusion or pneumothorax. Endotracheal tube terminates 5.5 cm above the carina. Heart is normal in size. Enteric tube terminates in the gastric cardia. IMPRESSION: Endotracheal tube terminates 5.5 cm above the carina. Enteric tube terminates in the gastric cardia. Electronically Signed   By: Julian Hy M.D.   On: 01/06/2017 20:03    Procedures Procedures (including critical care time)  INTUBATION Performed by: Varney Biles  Required items: required blood products, implants, devices, and special equipment available Patient identity confirmed: provided demographic data and hospital-assigned identification number Time out: Immediately prior to procedure a "time out" was called to verify the correct patient, procedure, equipment, support staff and site/side marked as required.  Indications: Airway compromise  Intubation method: Direct Laryngoscopy   Preoxygenation: BVM  Sedatives: Etomidate  Tube Size: 7-0 cuffed  Post-procedure assessment: chest rise and ETCO2 monitor Breath sounds: equal and absent over the epigastrium Tube secured with: ETT holder Chest x-ray interpreted by radiologist and me.  Chest x-ray findings: endotracheal tube in appropriate position  Patient tolerated the procedure well with no  immediate complications.  CRITICAL CARE Performed by: Varney Biles   Total critical care time: 48 minutes  Critical care time was exclusive of separately billable procedures and treating other patients.  Critical care was necessary to treat or prevent imminent or life-threatening deterioration.  Critical care was time spent personally by me on the following activities: development of treatment plan with patient and/or surrogate as well as nursing, discussions with consultants, evaluation of patient's response to treatment, examination of patient, obtaining history from patient or surrogate, ordering and performing treatments and interventions, ordering and review of laboratory studies, ordering and review of radiographic studies, pulse oximetry and re-evaluation of patient's condition.   Medications Ordered in ED Medications  dextrose 50 % solution (25 mLs Intravenous Given 01/06/17 2025)  carvedilol (COREG) tablet 12.5 mg (12.5 mg Oral Given 01/07/17 0747)  0.9 %  sodium chloride infusion ( Intravenous Rate/Dose Verify 01/07/17 1001)  pantoprazole sodium (PROTONIX) 40 mg/20 mL oral suspension 40 mg (40 mg Per Tube Given 01/07/17 0911)  insulin aspart (novoLOG) injection 0-15 Units (2 Units Subcutaneous Given 01/07/17 1220)  chlorhexidine gluconate (MEDLINE KIT) (PERIDEX) 0.12 % solution 15 mL (15 mLs Mouth Rinse Given 01/07/17 0748)  MEDLINE mouth rinse (15 mLs Mouth Rinse Given 01/07/17 1220)  labetalol (NORMODYNE,TRANDATE) injection 10 mg (10 mg Intravenous Given 01/07/17 0214)  clopidogrel (PLAVIX) tablet 75 mg (75 mg Per Tube Given 01/07/17 0912)  irbesartan (AVAPRO) tablet 300 mg (300 mg Per Tube Given 01/07/17 0911)  levothyroxine (SYNTHROID, LEVOTHROID) tablet 50 mcg (50 mcg Per Tube Given 01/07/17 0747)  simvastatin (ZOCOR) tablet 20 mg (20 mg Per Tube Given 01/07/17 0214)  acetaminophen (TYLENOL) solution 650 mg (650 mg Per Tube Given 01/07/17 0311)  hydrALAZINE (APRESOLINE) injection  10-40 mg (20 mg Intravenous Given 01/07/17 0409)  piperacillin-tazobactam (ZOSYN) IVPB 3.375 g (3.375 g Intravenous New  Bag/Given 01/07/17 1215)  midazolam (VERSED) injection 1-4 mg (not administered)  fentaNYL (SUBLIMAZE) injection 25-50 mcg (not administered)  heparin injection 5,000 Units (not administered)  dextrose 50 % solution 50 mL (50 mLs Intravenous Given 01/06/17 1934)  propofol (DIPRIVAN) 1000 MG/100ML infusion (  Stopped 01/07/17 0310)  magnesium sulfate 3 g in dextrose 5 % 100 mL IVPB (0 g Intravenous Stopped 01/07/17 1201)  potassium chloride 20 MEQ/15ML (10%) solution 40 mEq (40 mEq Per Tube Given 01/07/17 0857)     Initial Impression / Assessment and Plan / ED Course  I have reviewed the triage vital signs and the nursing notes.  Pertinent labs & imaging results that were available during my care of the patient were reviewed by me and considered in my medical decision making (see chart for details).     Pt comes in with GCS of 3, with seizure like activity, agonal respirations post versed 2.5 mg. Pt was noted to be hypoglycemic in the ER, which upon correction didn't lead to improved mental status or airway protection - so patient was intubated.  It is not entirely clear what the seizure like activity was prior to the insulin given. Clearly there appears to have been some abrupt change in mental status that led to blood glucose check... Either way, the low CBG at ER arrival was certainly not helping and was probably from the insulin given. We will monitor CBG closely.  Pt is also noted to have low temp. Infection is less likely in our ddx due to abrupt changes. Basic labs ordered.  Ct head ordered to ensure there is no brain bleed/mass.  Neuro consulted - ICU consulted.  Family came later on, and we had a frank and long conversation on the ER presentation and the workup.    Final Clinical Impressions(s) / ED Diagnoses   Final diagnoses:  Status epilepticus ([place])    Hypoglycemia  Respiratory compromise    New Prescriptions Current Discharge Medication List       Varney Biles, MD 01/07/17 1230

## 2017-01-07 NOTE — Progress Notes (Signed)
eLink Physician-Brief Progress Note Patient Name: Ashley Velez DOB: 1935/03/19 MRN: [id number]   Date of Service  01/07/2017  HPI/Events of Note  No seizures  eICU Interventions  D/c q15 min neuro checks     Intervention Category Major Interventions: Change in mental status - evaluation and management  Simonne Maffucci 01/07/2017, 2:59 AM

## 2017-01-07 NOTE — ED Notes (Signed)
CBG 118 

## 2017-01-07 NOTE — Progress Notes (Deleted)
Pt transported from [facility] by [facility]. Placed on vent on previous settings.  RT will continue to monitor.

## 2017-01-07 NOTE — Procedures (Signed)
ELECTROENCEPHALOGRAM REPORT   Patient: Ashley Velez       Room #: 7Q96 EEG No. ID: [id number] Age: 82 y.o.        Sex: female Referring Physician: Halford Chessman Report Date:  01/07/2017        Interpreting Physician: Alexis Goodell  History: Ashley Velez is an 82 y.o. female with seizure-like activity noted at facility  Medications:  Zocor, Zosyn, Synthroid, Avapro, Coreg, Plavix, Insulin, Labetalol, Apresoline, Fentanyl  Conditions of Recording:  This is a 16 channel EEG carried out with the patient in the awake and drowsy state.  Description:  The waking background activity consists of a low voltage, symmetrical, fairly well organized, 8-9 Hz alpha activity, seen from the parieto-occipital and posterior temporal regions.  Low voltage fast activity, poorly organized, is seen anteriorly and is at times superimposed on more posterior regions.  A mixture of theta and alpha rhythms are seen from the central and temporal regions. The patient drowses with slowing to irregular, low voltage theta and beta activity.   Stage II sleep is not obtained. No epileptiform activity is noted.   Hyperventilation and intermittent photic stimulation were not performed.  IMPRESSION: Normal electroencephalogram, awake, drowsy and without activation procedures. There are no focal lateralizing or epileptiform features.   Alexis Goodell, MD Neurology [phone] 01/07/2017, 2:20 PM

## 2017-01-07 NOTE — ED Notes (Signed)
Bear hugger removed.

## 2017-01-07 NOTE — Plan of Care (Signed)
Problem: Activity: Goal: Ability to tolerate increased activity will improve Outcome: Progressing Pt able to tolerate HOB at 30. Moves all extremities independently.

## 2017-01-07 NOTE — Progress Notes (Signed)
[facility] Physician-Brief Progress Note Patient Name: Cary Wilford DOB: July 08, 1935 MRN: [id number]   Date of Service  01/07/2017  HPI/Events of Note  Fever CXR normal U/A normal  eICU Interventions  Check blood, resp cultures AP[facility] prn Hold antibiotics for now     Intervention Category Intermediate Interventions: Infection - evaluation and management  Simonne Maffucci 01/07/2017, 1:23 AM

## 2017-01-07 NOTE — Progress Notes (Deleted)
[facility]Link Physician-Brief Progress Note Patient Name: Ashley Velez DOB: [birth year] MRN: [id number]   Date of Service  01/07/2017  HPI/Events of Note  Entered in error  eICU Interventions       Intervention Category Major Interventions: Seizures - evaluation and management  Simonne Maffucci 01/07/2017, 4:06 AM

## 2017-01-07 NOTE — Clinical Social Work Note (Signed)
Pt on vent support. CSW will complete SBIRT/ Trauma screening once pt medically stable.   [place], [place]

## 2017-01-07 NOTE — Progress Notes (Signed)
ABG results   PH: 7.53 CO2: 32.5 PO2: 528 HCO3: 27.4

## 2017-01-08 ENCOUNTER — Encounter (HOSPITAL_COMMUNITY): Payer: Self-pay

## 2017-01-08 ENCOUNTER — Inpatient Hospital Stay (HOSPITAL_COMMUNITY): Payer: Medicare Other

## 2017-01-08 LAB — BLOOD GAS, ARTERIAL
Acid-base deficit: 5.7 mmol/L — ABNORMAL HIGH (ref 0.0–2.0)
BICARBONATE: 17.9 mmol/L — AB (ref 20.0–28.0)
DRAWN BY: 27407
FIO2: 40
MECHVT: 470 mL
O2 Saturation: 98.9 %
PEEP/CPAP: 5 cmH2O
PO2 ART: 185 mmHg — AB (ref 83.0–108.0)
Patient temperature: 98.6
RATE: 10 resp/min
pCO2 arterial: 27.9 mmHg — ABNORMAL LOW (ref 32.0–48.0)
pH, Arterial: 7.422 (ref 7.350–7.450)

## 2017-01-08 LAB — RENAL FUNCTION PANEL
ANION GAP: 9 (ref 5–15)
Albumin: 2.8 g/dL — ABNORMAL LOW (ref 3.5–5.0)
BUN: 22 mg/dL — ABNORMAL HIGH (ref 6–20)
CHLORIDE: 108 mmol/L (ref 101–111)
CO2: 19 mmol/L — AB (ref 22–32)
Calcium: 8.2 mg/dL — ABNORMAL LOW (ref 8.9–10.3)
Creatinine, Ser: 1.85 mg/dL — ABNORMAL HIGH (ref 0.44–1.00)
GFR calc Af Amer: 29 mL/min — ABNORMAL LOW (ref >60)
GFR calc non Af Amer: 25 mL/min — ABNORMAL LOW (ref >60)
Glucose, Bld: 138 mg/dL — ABNORMAL HIGH (ref 65–99)
POTASSIUM: 3.7 mmol/L (ref 3.5–5.1)
Phosphorus: 2.4 mg/dL — ABNORMAL LOW (ref 2.5–4.6)
Sodium: 136 mmol/L (ref 135–145)

## 2017-01-08 LAB — GLUCOSE, CAPILLARY
GLUCOSE-CAPILLARY: 155 mg/dL — AB (ref 65–99)
GLUCOSE-CAPILLARY: 103 mg/dL — AB (ref 65–99)
Glucose-Capillary: 120 mg/dL — ABNORMAL HIGH (ref 65–99)
Glucose-Capillary: 123 mg/dL — ABNORMAL HIGH (ref 65–99)
Glucose-Capillary: 137 mg/dL — ABNORMAL HIGH (ref 65–99)
Glucose-Capillary: 89 mg/dL (ref 65–99)

## 2017-01-08 LAB — CBC WITH DIFFERENTIAL/PLATELET
BASOS ABS: 0 10*3/uL (ref 0.0–0.1)
Basophils Relative: 0 %
Eosinophils Absolute: 0.1 10*3/uL (ref 0.0–0.7)
Eosinophils Relative: 1 %
HCT: 29.2 % — ABNORMAL LOW (ref 36.0–46.0)
HEMOGLOBIN: 9.7 g/dL — AB (ref 12.0–15.0)
LYMPHS ABS: 1.7 10*3/uL (ref 0.7–4.0)
LYMPHS PCT: 12 %
MCH: 28.4 pg (ref 26.0–34.0)
MCHC: 33.2 g/dL (ref 30.0–36.0)
MCV: 85.4 fL (ref 78.0–100.0)
Monocytes Absolute: 1.4 10*3/uL — ABNORMAL HIGH (ref 0.1–1.0)
Monocytes Relative: 10 %
NEUTROS PCT: 77 %
Neutro Abs: 11.2 10*3/uL — ABNORMAL HIGH (ref 1.7–7.7)
Platelets: 259 10*3/uL (ref 150–400)
RBC: 3.42 MIL/uL — AB (ref 3.87–5.11)
RDW: 15.2 % (ref 11.5–15.5)
WBC: 14.4 10*3/uL — ABNORMAL HIGH (ref 4.0–10.5)

## 2017-01-08 LAB — MAGNESIUM: MAGNESIUM: 2.3 mg/dL (ref 1.7–2.4)

## 2017-01-08 LAB — LEGIONELLA PNEUMOPHILA SEROGP 1 UR AG: L. pneumophila Serogp 1 Ur Ag: NEGATIVE

## 2017-01-08 LAB — PROCALCITONIN

## 2017-01-08 MED ORDER — CHLORHEXIDINE GLUCONATE 0.12 % MT SOLN
OROMUCOSAL | Status: AC
  Administered 2017-01-08: 20:00:00
  Filled 2017-01-08: qty 15

## 2017-01-08 MED ORDER — PIPERACILLIN-TAZOBACTAM IN DEX 2-0.25 GM/50ML IV SOLN
2.2500 g | Freq: Four times a day (QID) | INTRAVENOUS | Status: DC
  Administered 2017-01-08 – 2017-01-10 (×8): 2.25 g via INTRAVENOUS
  Filled 2017-01-08 (×11): qty 50

## 2017-01-08 MED ORDER — SODIUM CHLORIDE 0.9 % IV BOLUS (SEPSIS)
500.0000 mL | Freq: Once | INTRAVENOUS | Status: AC
  Administered 2017-01-08: 500 mL via INTRAVENOUS

## 2017-01-08 MED ORDER — FENTANYL CITRATE (PF) 100 MCG/2ML IJ SOLN
25.0000 ug | INTRAMUSCULAR | Status: DC | PRN
  Administered 2017-01-09: 25 ug via INTRAVENOUS
  Filled 2017-01-08: qty 2

## 2017-01-08 NOTE — Progress Notes (Signed)
Pharmacy Antibiotic Note  Ashley Velez is a 82 y.o. female admitted on 01/06/2017 with pneumonia.  Pharmacy has been consulted for zosyn dosing. TMax is 101.5 and WBC is elevated at 16.6. SCr is elevated at 1.5.  Patient's renal function is worsening. Will adjust zosyn based on current renal function.  Plan: Zosyn 2.25gm IV q6h Monitor culture data, renal function and clinical course  Height: 5\' 3"  (160 cm) Weight: 110 lb 7.2 oz (50.1 kg) IBW/kg (Calculated) : 52.4  Temp (24hrs), Avg:100.2 F (37.9 C), Min:99 F (37.2 C), Max:101.1 F (38.4 C)   Recent Labs Lab 01/06/17 2000 01/06/17 2010 01/07/17 0230 01/08/17 0323  WBC 12.5*  --  16.6* 14.4*  CREATININE 1.57* 1.40* 1.50* 1.85*    Estimated Creatinine Clearance: 19.2 mL/min (A) (by C-G formula based on SCr of 1.85 mg/dL (H)).    Allergies  Allergen Reactions  . Fosamax [Alendronate]     Noted to be an allergy on the patient's paperwork from facility, but no reaction is recorded  . Macrodantin [Nitrofurantoin]     Noted to be an allergy on the patient's paperwork from facility, but no reaction is recorded    Antimicrobials this admission: Zosyn 6/14>>  Microbiology results: 6/14 BCx2: sent 6/14 MRSA PCR: neg  Andrey Cota. Diona Foley, PharmD, BCPS Clinical Pharmacist [phone] 01/08/2017 8:13 AM

## 2017-01-08 NOTE — Progress Notes (Signed)
[facility]Link Physician-Brief Progress Note Patient Name: Ashley Velez DOB: September 23, 1934 MRN: [id number]   Date of Service  01/08/2017  HPI/Events of Note  Oliguria - Bladder scan with about 250 mL.  eICU Interventions  Will order: 1. Bolus with 0.9 NaCl 1 liter IV over 1 hour now.      Intervention Category Intermediate Interventions: Oliguria - evaluation and management  Ashley Velez 01/08/2017, 4:27 PM

## 2017-01-08 NOTE — Progress Notes (Signed)
Pt placed back on full support due to periods of apnea.  RT will continue to monitor.

## 2017-01-08 NOTE — Progress Notes (Signed)
Subjective: Patient is awake and alert. She remains intubated. She is able to follow commands. No further seizures noted.  Exam: Vitals:   01/08/17 0800 01/08/17 0828  BP: (!) 138/56 (!) 128/53  Pulse: 71 76  Resp: (!) 26 14  Temp: 98.7 F (37.1 C)     HEENT-  Normocephalic, no lesions, without obvious abnormality.  Normal external eye and conjunctiva.  Normal TM's bilaterally.  Normal auditory canals and external ears. Normal external nose, mucus membranes and septum.  Normal pharynx.    Neuro:  CN: Pupils are equal and round. They are symmetrically reactive from 3-->2 mm. EOMI without nystagmus. Facial sensation is intact to light touch. Face is symmetric at rest with normal strength and mobility. Hearing is intact to conversational voice. Palate elevates symmetrically and uvula is midline. Voice is normal in tone, pitch and quality. Bilateral SCM and trapezii are 5/5. Tongue is midline with normal bulk and mobility.  Motor: Moving all extremities antigravity  Sensation: Intact to light touch.      Pertinent Labs/Diagnostics: MRI was obtained and did not show any signs of PRES, patient does have old right basal ganglia and right cerebellar infarcts along with moderate to severe chronic small vessel ischemic disease  EEG was obtained and did not show any epileptiform activity    Impression: This is a patient presenting with new onset seizure in the setting of multiple medical issues. Likely contributor to her seizures was her hyperglycemia. No further seizures noted. However due to patient's pneumonia versus pneumonitis along with fluctuating blood glucose would keep patient on Keppra until stable at that point in time Keppra may be discharged when patient has been discharged from hospital. Neurology will sign off at this time  Etta Quill PA-C [facility] [phone]    01/08/2017, 9:54 AM

## 2017-01-08 NOTE — Progress Notes (Addendum)
[facility] Attending Note  ADMISSION DATE:  01/06/2017  REFERRING MD:  Kathrynn Humble  CHIEF COMPLAINT:  Seizure  Presenting HPI:  82 y.o. female reported to have seizure type activity at [facility].  Had shaking of arms and legs.  Blood sugar was 340 and given 10 units insulin.  Then blood sugar was in 60's.  She had seizure type activity and given versed.  Then became unresponsive and intubated.  Blood pressure was in 200's.  Neurology was called to assess patient.  She started waking up and following commands in ER.  She was then started on diprivan.  Subjective:  No acute events overnight. Reportedly patient had insufficient respiratory effort this morning was attempted pressure support wean. Received Versed and fentanyl around midnight for transport.  Review of Systems:  Unable to obtain given intubation.   Vent Mode: PRVC FiO2 (%):  [40 %] 40 % Set Rate:  [14 bmp] 14 bmp Vt Set:  [470 mL] 470 mL PEEP:  [5 cmH20] 5 cmH20 Pressure Support:  [5 cmH20-10 cmH20] 10 cmH20 Plateau Pressure:  [14 cmH20-16 cmH20] 14 cmH20  Temp:  [98.7 F (37.1 C)-101.1 F (38.4 C)] 98.7 F (37.1 C) (06/15 0800) Pulse Rate:  [61-88] 76 (06/15 0828) Resp:  [14-26] 14 (06/15 0828) BP: (90-175)/(45-80) 128/53 (06/15 0828) SpO2:  [99 %-100 %] 100 % (06/15 0828) FiO2 (%):  [40 %] 40 % (06/15 0828)  General:  Eyes closed. No distress. Daughter at bedside.  Integument:  Warm & dry. No rash on exposed skin. Bruising of various ages on upper extremities. Extremities:  No cyanosis or clubbing.  HEENT:  Moist mucus membranes. No scleral injection or icterus. Endotracheal tube in place.  Cardiovascular:  Regular rate. No edema. No appreciable JVD.  Pulmonary:  Good aeration & clear to auscultation bilaterally. Symmetric chest wall rise on ventilator. Abdomen: Soft. Normal bowel sounds. Nondistended.  Musculoskeletal:  Normal bulk and tone. No joint deformity or effusion appreciated. Neurological:  Moving all 4 extremities equally. Following commands. Nods to questions.  LINES/TUBES: OETT 6/13 >>> Foley 6/13 >>> OGT 6/13 >>> PIV  CBC Latest Ref Rng & Units 01/08/2017 01/07/2017 01/06/2017  WBC 4.0 - 10.5 K/uL 14.4(H) 16.6(H) -  Hemoglobin 12.0 - 15.0 g/dL 9.7(L) 11.0(L) 10.9(L)  Hematocrit 36.0 - 46.0 % 29.2(L) 34.3(L) 32.0(L)  Platelets 150 - 400 K/uL 259 299 -   BMP Latest Ref Rng & Units 01/08/2017 01/07/2017 01/06/2017  Glucose 65 - 99 mg/dL 138(H) 162(H) 149(H)  BUN 6 - 20 mg/dL 22(H) 19 25(H)  Creatinine 0.44 - 1.00 mg/dL 1.85(H) 1.50(H) 1.40(H)  Sodium 135 - 145 mmol/L 136 140 142  Potassium 3.5 - 5.1 mmol/L 3.7 3.6 3.5  Chloride 101 - 111 mmol/L 108 107 108  CO2 22 - 32 mmol/L 19(L) 23 -  Calcium 8.9 - 10.3 mg/dL 8.2(L) 9.1 -   Hepatic Function Latest Ref Rng & Units 01/08/2017 01/07/2017 01/06/2017  Total Protein 6.5 - 8.1 g/dL - 6.1(L) 7.3  Albumin 3.5 - 5.0 g/dL 2.8(L) 3.5 4.2  AST 15 - 41 U/L - 21 26  ALT 14 - 54 U/L - 14 14  Alk Phosphatase 38 - 126 U/L - 43 48  Total Bilirubin 0.3 - 1.2 mg/dL - 0.7 0.4    IMAGING/STUDIES: UDS 6/13:  Benzos Positive CT HEAD W/O 6/13:  No evidence of acute intracranial abnormality. Atrophy, chronic small-vessel white matter ischemic changes and remote right basal ganglia and right cerebellar infarcts. PORT CXR 6/14:  Previously reviewed by me. Questionable right midlung hazy opacity. Endotracheal tube in good position. Enteric feeding tube coursing below diaphragm with tip visualized in gastric fundus.  EEG 6/14:  Normal electroencephalogram, awake, drowsy and without activation procedures. There are no focal lateralizing or epileptiform features. MRI BRAIN W/O 6/15:  No acute intracranial process. Old RIGHT basal basal ganglia and RIGHT cerebellar infarcts. Moderate to severe chronic small vessel ischemic disease.  MICROBIOLOGY: Urine Streptococcus Pneumoniae Antigen 6/13:  Negative MRSA PCR 6/14:  Negative  Blood Cultures x2 6/14  >>>  ANTIBIOTICS: Zosyn 6/15 >>>  SIGNIFICANT EVENTS: 06/13 - Admit & neurology consulted 06/14 - FUO >> cultured overnight. Zosyn started during rounds.   ASSESSMENT/PLAN:  82 y.o. female with altered mentation & seizure. Seizure likely multifactorial in etiology. Following commands. Suspect aspiration pneumonia versus pneumonitis.   1. Acute encephalopathy: Likely multifactorial from postictal state as well as medication effect. Improving. Further limiting when necessary IV sedation. 2. Seizure: Appreciate cardiology assistance. Continuing Keppra for now. Continuing seizure precautions. 3. Acute hypoxic respiratory failure/respiratory alkalosis: Limiting sedation as this seems to be decreasing respiratory drive. Repeat ABG this morning. Daily pressure support wean and spontaneous breathing trial. 4. Acute renal failure: Suspect secondary to hypertension. Continuing to monitor urine output with Foley cath. Trending electrolytes and renal function daily. 5. Hypoglycemia: Resolved. Continuing Accu-Cheks every 4 hours. Hypertensive emergency: Resolved. Normotensive on Coreg and Avapro. Monitoring vitals per unit protocol. 6. FUO/right lower lobe aspiration pneumonia versus pneumonitis: Cultures pending. Trending Procalcitonin per algorithm. Zosyn day #2. Urine Legionella antigen pending. 7. Diabetes mellitus: Holding home metformin. Continuing Accu-Cheks and insulin as above. 8. History of stroke: Continuing Plavix. 9. Hypomagnesemia: Resolved. 10. Relative hypokalemia: Resolved. 11. Hyperlipidemia: Continuing Zocor. 12. Hypertriglyceridemia: Avoiding propofol infusion. 13. Hypothyroidism: Continuing Synthroid.   Prophylaxis:  SCDs, Heparin [place] q8hr, & Protonix via tube daily. Diet:  NPO.  Holding on tube feeds pending possible extubation in the next 24 hours. Code Status:  Full Code as per previous physician discussions. Disposition:  Patient to remain in ICU while intubated. Family  Update:  Daughter updated during rounds today.  I have spent a total of 32 minutes of critical care time today caring for the patient, updating family at bedside, and reviewing the patient's electronic medical record.   Sonia Baller Ashok Cordia, M.D. [facility] Pager:  [phone] After 3pm or if no response, call [phone] 9:01 AM 01/08/17

## 2017-01-09 ENCOUNTER — Inpatient Hospital Stay (HOSPITAL_COMMUNITY): Payer: Medicare Other

## 2017-01-09 ENCOUNTER — Encounter (HOSPITAL_COMMUNITY): Payer: Self-pay

## 2017-01-09 DIAGNOSIS — J9601 Acute respiratory failure with hypoxia: Secondary | ICD-10-CM

## 2017-01-09 LAB — POCT I-STAT 3, ART BLOOD GAS (G3+)
Acid-base deficit: 14 mmol/L — ABNORMAL HIGH (ref 0.0–2.0)
Bicarbonate: 10.9 mmol/L — ABNORMAL LOW (ref 20.0–28.0)
O2 Saturation: 95 %
Patient temperature: 98.6
TCO2: 12 mmol/L (ref 0–100)
pCO2 arterial: 21 mmHg — ABNORMAL LOW (ref 32.0–48.0)
pH, Arterial: 7.324 — ABNORMAL LOW (ref 7.350–7.450)
pO2, Arterial: 78 mmHg — ABNORMAL LOW (ref 83.0–108.0)

## 2017-01-09 LAB — RENAL FUNCTION PANEL
Albumin: 2.4 g/dL — ABNORMAL LOW (ref 3.5–5.0)
Anion gap: 13 (ref 5–15)
BUN: 23 mg/dL — ABNORMAL HIGH (ref 6–20)
CO2: 15 mmol/L — ABNORMAL LOW (ref 22–32)
Calcium: 7.3 mg/dL — ABNORMAL LOW (ref 8.9–10.3)
Chloride: 113 mmol/L — ABNORMAL HIGH (ref 101–111)
Creatinine, Ser: 2.07 mg/dL — ABNORMAL HIGH (ref 0.44–1.00)
GFR calc Af Amer: 25 mL/min — ABNORMAL LOW (ref >60)
GFR calc non Af Amer: 21 mL/min — ABNORMAL LOW (ref >60)
Glucose, Bld: 110 mg/dL — ABNORMAL HIGH (ref 65–99)
Phosphorus: 3.5 mg/dL (ref 2.5–4.6)
Potassium: 3.5 mmol/L (ref 3.5–5.1)
Sodium: 141 mmol/L (ref 135–145)

## 2017-01-09 LAB — CBC WITH DIFFERENTIAL/PLATELET
Basophils Absolute: 0 10*3/uL (ref 0.0–0.1)
Basophils Relative: 0 %
Eosinophils Absolute: 0.2 10*3/uL (ref 0.0–0.7)
Eosinophils Relative: 1 %
HCT: 28.8 % — ABNORMAL LOW (ref 36.0–46.0)
Hemoglobin: 9.1 g/dL — ABNORMAL LOW (ref 12.0–15.0)
Lymphocytes Relative: 11 %
Lymphs Abs: 1.4 10*3/uL (ref 0.7–4.0)
MCH: 27.8 pg (ref 26.0–34.0)
MCHC: 31.6 g/dL (ref 30.0–36.0)
MCV: 88.1 fL (ref 78.0–100.0)
Monocytes Absolute: 1.2 10*3/uL — ABNORMAL HIGH (ref 0.1–1.0)
Monocytes Relative: 9 %
Neutro Abs: 10.2 10*3/uL — ABNORMAL HIGH (ref 1.7–7.7)
Neutrophils Relative %: 79 %
Platelets: 223 10*3/uL (ref 150–400)
RBC: 3.27 MIL/uL — ABNORMAL LOW (ref 3.87–5.11)
RDW: 15.6 % — ABNORMAL HIGH (ref 11.5–15.5)
WBC: 13 10*3/uL — ABNORMAL HIGH (ref 4.0–10.5)

## 2017-01-09 LAB — GLUCOSE, CAPILLARY
GLUCOSE-CAPILLARY: 92 mg/dL (ref 65–99)
GLUCOSE-CAPILLARY: 116 mg/dL — AB (ref 65–99)
Glucose-Capillary: 133 mg/dL — ABNORMAL HIGH (ref 65–99)
Glucose-Capillary: 116 mg/dL — ABNORMAL HIGH (ref 65–99)
Glucose-Capillary: 127 mg/dL — ABNORMAL HIGH (ref 65–99)
Glucose-Capillary: 173 mg/dL — ABNORMAL HIGH (ref 65–99)
Glucose-Capillary: 90 mg/dL (ref 65–99)

## 2017-01-09 LAB — PROCALCITONIN: Procalcitonin: <0.1 ng/mL

## 2017-01-09 LAB — MAGNESIUM: Magnesium: 2 mg/dL (ref 1.7–2.4)

## 2017-01-09 MED ORDER — CHLORHEXIDINE GLUCONATE 0.12 % MT SOLN: 15.0000 mL | Freq: Two times a day (BID) | OROMUCOSAL | Status: DC

## 2017-01-09 MED ORDER — ORAL CARE MOUTH RINSE
15.0000 mL | Freq: Two times a day (BID) | OROMUCOSAL | Status: DC
  Administered 2017-01-09 (×2): 15 mL via OROMUCOSAL

## 2017-01-09 MED ORDER — FENTANYL CITRATE (PF) 100 MCG/2ML IJ SOLN: 12.5000 ug | INTRAMUSCULAR | Status: DC | PRN

## 2017-01-09 MED ORDER — HYDRALAZINE HCL 20 MG/ML IJ SOLN
10.0000 mg | INTRAMUSCULAR | Status: DC | PRN
  Administered 2017-01-09: 20 mg via INTRAVENOUS
  Filled 2017-01-09: qty 1

## 2017-01-09 NOTE — Procedures (Signed)
Extubation Procedure Note  Patient Details:   Name: Ashley Velez DOB: 1935-07-10 MRN: [id number]   Airway Documentation:     Evaluation  O2 sats: stable throughout Complications: No apparent complications Patient did tolerate procedure well. Bilateral Breath Sounds: Clear, Diminished   Yes   Positive cuff leak noted.  Pt placed on [place] 3 Lpm with humidity, no stridor noted. Tolerating well at this time.  Mingo Amber Caragh Gasper 01/09/2017, 1:43 PM

## 2017-01-09 NOTE — Progress Notes (Signed)
End of shift evaluation:  Pt extubated around 1345 today.  Doing great.  On RA satting 100%.  Been oob to chair.  Concerns this shift with renal function and decreased urine output.  (See bladder scan documentation throughout shift). Pt voided and had BM in BSC.  Adequate amt of urine noted.  Unable to measure.  PRV bladder scan done and 80 ml noted.  Pt states, "I have worked on my ability to hold my urine bc I used to pee on myself and wear diapers."  Renal US ordered and done this shift as well.  Pt has no c/o pain.  No fevers.  No other issues.

## 2017-01-09 NOTE — Evaluation (Signed)
Clinical/Bedside Swallow Evaluation Patient Details  Name: Ashley Velez MRN: [id number] Date of Birth: 04-30-1935  Today's Date: 01/09/2017 Time: SLP Start Time (ACUTE ONLY): 75 SLP Stop Time (ACUTE ONLY): 1715 SLP Time Calculation (min) (ACUTE ONLY): 12 min  Past Medical History:  Past Medical History:  Diagnosis Date  . Diabetes mellitus without complication ([place])   . Hypertension   . Stroke [facility])    Past Surgical History: History reviewed. No pertinent surgical history. HPI:  82 y/o female admitted on 6/13 from a nursing home with seizure activity in setting of hypoglycemia. Remained encephalopathic and hypertensive requiring intubation and ICU admission. Intubated 6/13-6/16. 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   Assessment / Plan / Recommendation Clinical Impression  Patient presents with mild risk for aspiration s/p 4 day intubation. Patient is alert, pleasant and eager for PO. Her voice is mildly hoarse, cough is strong. Oropharyngeal swallow appears at bedside to be within functional limits with adequate airway protection. No overt signs of aspiration observed, and vital signs remain stable with repeated challenging with thin liquids via consecutive straw sips. Pt typically wears dentures which are not available, and thus requires mildly prolonged mastication time for regular solids. Oral control and clearance adequate, swallow appears timely. Recommend initiating dys 2 diet with thin liquids, medications whole in puree. SLP will f/u for tolerance, advancement likely with availability of dentures. SLP Visit Diagnosis: Dysphagia, unspecified (R13.10)    Aspiration Risk  Mild aspiration risk    Diet Recommendation Dysphagia 2 (Fine chop);Thin liquid   Liquid Administration via: Cup;Straw Medication Administration: Whole meds with puree Supervision: Patient able to self feed Compensations:  Slow rate;Small sips/bites Postural Changes: Seated upright at 90 degrees    Other  Recommendations Oral Care Recommendations: Oral care BID   Follow up Recommendations None      Frequency and Duration min 2x/week  2 weeks       Prognosis Prognosis for Safe Diet Advancement: Good      Swallow Study   General Date of Onset: 01/06/17 HPI: 82 y/o female admitted on 6/13 from a nursing home with seizure activity in setting of hypoglycemia. Remained encephalopathic and hypertensive requiring intubation and ICU admission. Intubated 6/13-6/16. 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 Type of Study: Bedside Swallow Evaluation Previous Swallow Assessment: none in chart Diet Prior to this Study: NPO Temperature Spikes Noted: No Respiratory Status: Nasal cannula History of Recent Intubation: Yes Length of Intubations (days): 4 days Date extubated: 01/09/17 Behavior/Cognition: Alert;Cooperative Oral Cavity Assessment: Within Functional Limits Oral Care Completed by SLP: Yes Oral Cavity - Dentition: Dentures, not available Vision: Functional for self-feeding Self-Feeding Abilities: Able to feed self Patient Positioning: Upright in chair Baseline Vocal Quality: Hoarse Volitional Cough: Strong Volitional Swallow: Able to elicit    Oral/Motor/Sensory Function Overall Oral Motor/Sensory Function: Within functional limits   Ice Chips Ice chips: Within functional limits Presentation: Spoon   Thin Liquid Thin Liquid: Within functional limits Presentation: Cup;Spoon;Straw    Nectar Thick Nectar Thick Liquid: Not tested   Honey Thick Honey Thick Liquid: Not tested   Puree Puree: Within functional limits Presentation: Spoon;Self Fed   Solid   GO   Solid: Within functional limits Presentation: Self Ethelle Lyon 01/09/2017,5:39 PM  Deneise Lever, [place], [place]  Pathologist [phone]

## 2017-01-09 NOTE — Progress Notes (Signed)
Pt ended up having 350 ml urine on bladder scan.  Will continue to monitor pt and attempt to let her use bsc.  Notified Dr. Lake Bells of results.

## 2017-01-09 NOTE — Progress Notes (Signed)
PULMONARY / CRITICAL CARE MEDICINE   Name: Ashley Velez MRN: [id number] DOB: 1935/02/28    ADMISSION DATE:  01/06/2017 CONSULTATION DATE:  01/06/2017  REFERRING MD:  Kathrynn Humble  CHIEF COMPLAINT:  Seizure  BRIEF:  82 y/o female admitted on 6/13 from a nursing home with seizure activity in setting of hypoglycemia.  Remained encephalopathic and hypertensive requiring intubation and ICU admission.  SUBJECTIVE:  No acute issues overnight Passing SBT  VITAL SIGNS: BP (!) 140/55 (BP Location: Left Arm)   Pulse 76   Temp 99.2 F (37.3 C) (Axillary) Comment: Simultaneous filing. User may not have seen previous data. Comment (Src): Simultaneous filing. User may not have seen previous data.  Resp (!) 22   Ht 5\' 3"  (1.6 m)   Wt 110 lb 7.2 oz (50.1 kg)   SpO2 100%   BMI 19.57 kg/m   HEMODYNAMICS:    VENTILATOR SETTINGS: Vent Mode: PSV;CPAP FiO2 (%):  [30 %-40 %] 30 % Set Rate:  [10 bmp-14 bmp] 10 bmp Vt Set:  [470 mL] 470 mL PEEP:  [5 cmH20] 5 cmH20 Pressure Support:  [5 cmH20-8 cmH20] 5 cmH20 Plateau Pressure:  [12 WUJ81-19 cmH20] 12 cmH20  INTAKE / OUTPUT: I/O last 3 completed shifts: In: 2580 [I.V.:1800; NG/GT:80; IV Piggyback:700] Out: 595 [Urine:595]  PHYSICAL EXAMINATION: General:  Resting comfortably HEENT: NCAT ETT in place PULM: CTA B, vent supported breathing CV: RRR, no mgr GI: BS+, soft, nontender MSK: normal bulk and tone Neuro: awake, alert, following commands on vent  LABS:  BMET  Recent Labs Lab 01/07/17 0230 01/08/17 0323 01/09/17 0532  NA 140 136 141  K 3.6 3.7 3.5  CL 107 108 113*  CO2 23 19* 15*  BUN 19 22* 23*  CREATININE 1.50* 1.85* 2.07*  GLUCOSE 162* 138* 110*    Electrolytes  Recent Labs Lab 01/06/17 2000 01/07/17 0230 01/08/17 0323 01/09/17 0532  CALCIUM 9.7 9.1 8.2* 7.3*  MG 1.9 1.6* 2.3 2.0  PHOS 3.0  --  2.4* 3.5    CBC  Recent Labs Lab 01/07/17 0230 01/08/17 0323 01/09/17 0532  WBC 16.6* 14.4* 13.0*  HGB  11.0* 9.7* 9.1*  HCT 34.3* 29.2* 28.8*  PLT 299 259 223    Coag's  Recent Labs Lab 01/06/17 2000  APTT 29  INR 1.00    Sepsis Markers  Recent Labs Lab 01/07/17 0706 01/08/17 0323 01/09/17 0532  PROCALCITON <0.10 <0.10 <0.10    ABG  Recent Labs Lab 01/08/17 1015 01/09/17 0357  PHART 7.422 7.324*  PCO2ART 27.9* 21.0*  PO2ART 185* 78.0*    Liver Enzymes  Recent Labs Lab 01/06/17 2000 01/07/17 0230 01/08/17 0323 01/09/17 0532  AST 26 21  --   --   ALT 14 14  --   --   ALKPHOS 48 43  --   --   BILITOT 0.4 0.7  --   --   ALBUMIN 4.2 3.5 2.8* 2.4*    Cardiac Enzymes  Recent Labs Lab 01/06/17 2000  TROPONINI <0.03    Glucose  Recent Labs Lab 01/08/17 1634 01/08/17 2005 01/08/17 2354 01/09/17 0345 01/09/17 0813 01/09/17 1148  GLUCAP 120* 123* 89 133* 92 116*    Imaging No results found.   IMAGING/STUDIES: UDS 6/13:  Benzos Positive CT HEAD W/O 6/13:  No evidence of acute intracranial abnormality. Atrophy, chronic small-vessel white matter ischemic changes and remote right basal ganglia and right cerebellar infarcts. PORT CXR 6/14:  Previously reviewed by me. Questionable right midlung hazy opacity. Endotracheal tube  in good position. Enteric feeding tube coursing below diaphragm with tip visualized in gastric fundus.  EEG 6/14:  Normal electroencephalogram, awake, drowsyand withoutactivation procedures. There are no focal lateralizing or epileptiform features. MRI BRAIN W/O 6/15:  No acute intracranial process. Old RIGHT basal basal ganglia and RIGHT cerebellar infarcts. Moderate to severe chronic small vessel ischemic disease.  MICROBIOLOGY: Urine Streptococcus Pneumoniae Antigen 6/13:  Negative MRSA PCR 6/14:  Negative  Blood Cultures x2 6/14 >>>  ANTIBIOTICS: Zosyn 6/15 >>>  SIGNIFICANT EVENTS: 06/13 - Admit & neurology consulted 06/14 - FUO >> cultured overnight. Zosyn started during rounds.   LINES/TUBES: OETT 6/13 >>>  6/16 Foley 6/13 >>> OGT 6/13 >>> PIV  DISCUSSION: 82 y/o female admitted on 6/13 from a nursing home with seizure activity in setting of hypoglycemia.  Remained encephalopathic and hypertensive requiring intubation and ICU admission.  ASSESSMENT / PLAN:  PULMONARY A: Acute respiratory failure with hypoxemia> better, now passing SBT Aspiration pneumonia P:   See ID Extubate this morning Aspiration precautions  CARDIOVASCULAR A:  No acute issues P:  Tele Monitor Vitals in ICU seting  RENAL A:   AKI, non-oliguric P:   Bladder scan > if elevated then foley catheter Check urine Na and Cr Monitor BMET and UOP Replace electrolytes as needed  GASTROINTESTINAL A:   No acute issues P:   SLP eval post extubation  HEMATOLOGIC A:   Anemia without bleeding P:  Monitor for bleeding  INFECTIOUS A:   Aspiration pneumonia P:   F/u cultures Continue zosyn for 5 day course  ENDOCRINE A:   Hypothyroidism Hypoglycemia prior to admission   Hyperglycemia post admission P:   Monitor glucose SSI  NEUROLOGIC A:   Acute encephalopathy> improved Seizure disorder P:   Stop sedation protocol Continue keppra per neurology recommendations Neuro checks per ICU protocol   FAMILY  - Updates: sister and daughter updated bedside 6/16   My cc time 31 minutes  Roselie Awkward, MD [place] [facility] Pager: [phone] Cell: [phone] After 3pm or if no response, call [phone]   01/09/2017, 1:12 PM

## 2017-01-09 NOTE — Progress Notes (Signed)
RT instructed pt and family on use of incentive spirometer.  Pt able to get 750 mL.

## 2017-01-09 NOTE — Progress Notes (Signed)
Initial Nutrition Assessment  DOCUMENTATION CODES:   Not applicable  INTERVENTION:  If unable to extubate, recommend Vital AF 1.2 at goal rate of 40 ml/h (960 ml per day) to provide 1152 kcals, 72 gm protein, 778 ml free water daily.  NUTRITION DIAGNOSIS:   Inadequate oral intake related to inability to eat as evidenced by NPO status.  GOAL:   Patient will meet greater than or equal to 90% of their needs  MONITOR:   Vent status, Labs, Weight trends, Skin, I & O's  REASON FOR ASSESSMENT:   Ventilator    ASSESSMENT:   82 y.o. female reported to have seizure type activity at [facility]. Blood sugar was 340 and given 10 units insulin.  Then blood sugar was in 60's.  She had seizure type activity and given versed.  Then became unresponsive and intubated. Suspect aspiration pneumonia versus pneumonitis.   Patient is currently intubated on ventilator support MV: 7.3 L/min Temp (24hrs), Avg:98.9 F (37.2 C), Min:98.2 F (36.8 C), Max:99.2 F (37.3 C)  Propofol: none  Nutrition-Focused physical exam completed. Findings are mild to moderate fat depletion, mild to moderate muscle depletion, and no edema. Depletion likely related to the natural aging process.   Labs and medications reviewed.   Diet Order:  Diet NPO time specified  Skin:  Reviewed, no issues  Last BM:  Unknown  Height:   Ht Readings from Last 1 Encounters:  01/07/17 5\' 3"  (1.6 m)    Weight:   Wt Readings from Last 1 Encounters:  01/07/17 110 lb 7.2 oz (50.1 kg)    Ideal Body Weight:  52.27 kg  BMI:  Body mass index is 19.57 kg/m.  Estimated Nutritional Needs:   Kcal:  1135  Protein:  65-80 grams  Fluid:  Per MD  EDUCATION NEEDS:   Education needs no appropriate at this time  Corrin Parker, MS, RD, LDN Pager # [phone] After hours/ weekend pager # [phone]

## 2017-01-10 ENCOUNTER — Inpatient Hospital Stay (HOSPITAL_COMMUNITY): Payer: Medicare Other

## 2017-01-10 DIAGNOSIS — J9600 Acute respiratory failure, unspecified whether with hypoxia or hypercapnia: Secondary | ICD-10-CM

## 2017-01-10 LAB — RENAL FUNCTION PANEL
ALBUMIN: 2.4 g/dL — AB (ref 3.5–5.0)
Anion gap: 12 (ref 5–15)
BUN: 22 mg/dL — AB (ref 6–20)
CO2: 13 mmol/L — ABNORMAL LOW (ref 22–32)
CREATININE: 1.8 mg/dL — AB (ref 0.44–1.00)
Calcium: 6.8 mg/dL — ABNORMAL LOW (ref 8.9–10.3)
Chloride: 110 mmol/L (ref 101–111)
GFR calc Af Amer: 29 mL/min — ABNORMAL LOW (ref >60)
GFR, EST NON AFRICAN AMERICAN: 25 mL/min — AB (ref >60)
GLUCOSE: 101 mg/dL — AB (ref 65–99)
PHOSPHORUS: 3.3 mg/dL (ref 2.5–4.6)
Potassium: 4.1 mmol/L (ref 3.5–5.1)
Sodium: 135 mmol/L (ref 135–145)

## 2017-01-10 LAB — CBC WITH DIFFERENTIAL/PLATELET
BASOS ABS: 0 10*3/uL (ref 0.0–0.1)
Basophils Relative: 0 %
EOS PCT: 4 %
Eosinophils Absolute: 0.5 10*3/uL (ref 0.0–0.7)
HCT: 30 % — ABNORMAL LOW (ref 36.0–46.0)
Hemoglobin: 9.4 g/dL — ABNORMAL LOW (ref 12.0–15.0)
LYMPHS PCT: 16 %
Lymphs Abs: 2.1 10*3/uL (ref 0.7–4.0)
MCH: 27.6 pg (ref 26.0–34.0)
MCHC: 31.3 g/dL (ref 30.0–36.0)
MCV: 88 fL (ref 78.0–100.0)
MONO ABS: 1.4 10*3/uL — AB (ref 0.1–1.0)
MONOS PCT: 11 %
Neutro Abs: 8.8 10*3/uL — ABNORMAL HIGH (ref 1.7–7.7)
Neutrophils Relative %: 69 %
PLATELETS: 288 10*3/uL (ref 150–400)
RBC: 3.41 MIL/uL — ABNORMAL LOW (ref 3.87–5.11)
RDW: 15.7 % — AB (ref 11.5–15.5)
WBC: 12.8 10*3/uL — ABNORMAL HIGH (ref 4.0–10.5)

## 2017-01-10 LAB — BASIC METABOLIC PANEL
ANION GAP: 12 (ref 5–15)
BUN: 21 mg/dL — ABNORMAL HIGH (ref 6–20)
CALCIUM: 7 mg/dL — AB (ref 8.9–10.3)
CO2: 15 mmol/L — AB (ref 22–32)
CREATININE: 1.85 mg/dL — AB (ref 0.44–1.00)
Chloride: 110 mmol/L (ref 101–111)
GFR calc Af Amer: 29 mL/min — ABNORMAL LOW (ref >60)
GFR, EST NON AFRICAN AMERICAN: 25 mL/min — AB (ref >60)
GLUCOSE: 108 mg/dL — AB (ref 65–99)
Potassium: 3.8 mmol/L (ref 3.5–5.1)
Sodium: 137 mmol/L (ref 135–145)

## 2017-01-10 LAB — GLUCOSE, CAPILLARY
GLUCOSE-CAPILLARY: 112 mg/dL — AB (ref 65–99)
GLUCOSE-CAPILLARY: 92 mg/dL (ref 65–99)
GLUCOSE-CAPILLARY: 238 mg/dL — AB (ref 65–99)
GLUCOSE-CAPILLARY: 301 mg/dL — AB (ref 65–99)
Glucose-Capillary: 267 mg/dL — ABNORMAL HIGH (ref 65–99)

## 2017-01-10 LAB — MAGNESIUM: Magnesium: 2 mg/dL (ref 1.7–2.4)

## 2017-01-10 MED ORDER — STARCH (THICKENING) PO POWD
ORAL | Status: DC | PRN
  Filled 2017-01-10: qty 227

## 2017-01-10 MED ORDER — PIPERACILLIN-TAZOBACTAM 3.375 G IVPB
3.3750 g | Freq: Three times a day (TID) | INTRAVENOUS | Status: DC
  Administered 2017-01-10 – 2017-01-11 (×3): 3.375 g via INTRAVENOUS
  Filled 2017-01-10 (×4): qty 50

## 2017-01-10 MED ORDER — FELODIPINE ER 5 MG PO TB24
5.0000 mg | ORAL_TABLET | Freq: Every day | ORAL | Status: DC
  Administered 2017-01-10 – 2017-01-12 (×3): 5 mg via ORAL
  Filled 2017-01-10 (×3): qty 1

## 2017-01-10 MED ORDER — OLANZAPINE 7.5 MG PO TABS
15.0000 mg | ORAL_TABLET | Freq: Every day | ORAL | Status: DC
  Administered 2017-01-10 – 2017-01-11 (×2): 15 mg via ORAL
  Filled 2017-01-10 (×2): qty 2

## 2017-01-10 MED ORDER — RESOURCE THICKENUP CLEAR PO POWD
ORAL | Status: DC | PRN
  Filled 2017-01-10: qty 125

## 2017-01-10 NOTE — Progress Notes (Signed)
  Speech Language Pathology Treatment: Dysphagia  Patient Details Name: Ashley Velez MRN: [id number] DOB: Dec 16, 1934 Today's Date: 01/10/2017 Time: 6580-0634 SLP Time Calculation (min) (ACUTE ONLY): 13 min  Assessment / Plan / Recommendation Clinical Impression  Pt seen for dysphagia treatment. Sitting upright in recliner, having just consumed 6 oz coffee and 4 oz pureed texture. Pt noted with increased WOB from yesterday's evaluation, respiratory rate in the mid-upper 20s vs avg 15 during yesterday's evaluation. With PO trials of thin and nectar-thick liquids, pureed solids, pt presents with elevated respiration, intermittent wet vocal quality. Given clinical signs of aspiration, recommend MBS to evaluate swallowing function. Recommend NPO pending instrumental assessment, to be completed today as fluoro schedule allows. Additional recommendations to follow MBS.    HPI HPI: 82 y/o female admitted on 6/13 from a nursing home with seizure activity in setting of hypoglycemia. Remained encephalopathic and hypertensive requiring intubation and ICU admission. Intubated 6/13-6/16. 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      SLP Plan  MBS;New goals to be determined pending instrumental study       Recommendations  Diet recommendations: NPO Medication Administration: Via alternative means                SLP Visit Diagnosis: Dysphagia, unspecified (R13.10) Plan: MBS;New goals to be determined pending instrumental study       [place], [place], [place] Speech-Language Pathologist [phone]  Aliene Altes 01/10/2017, 10:58 AM

## 2017-01-10 NOTE — Evaluation (Signed)
Physical Therapy Evaluation Patient Details Name: Ashley Velez MRN: [id number] DOB: January 10, 1935 Today's Date: 01/10/2017   History of Present Illness  Pt is a 82 yo female admitted through ED on 01/06/17 from SNF due to seizure like activity. Pt became unresponsive and was intubated in ED. Pt was intubated x 4 days and extubated on 01/09/17. Seizure was questioned to be related to hyperglycemia, stroke and epilepsy were ruled out of diagnoses. PMH significant for DM2, HTN, stroke, HLD.    Clinical Impression  Pt presents with the above diagnosis and below deficits for therapy evaluation. Prior to admission, pt was staying at [facility] receiving rehab when she developed the above symptoms. Prior to rehab, pt lived with her daughter and son-in-law who are available to help her. Pt required Mod A for bed mobs and transfers and Min A for short distance gait to bathroom and back to recliner. Pt will benefit from SNF at discharge for short term rehab in order to maximize her functional outcomes before returning home. Pt continues to require acute follow-up.     Follow Up Recommendations SNF    Equipment Recommendations  None recommended by PT    Recommendations for Other Services OT consult     Precautions / Restrictions Precautions Precautions: Fall Restrictions Weight Bearing Restrictions: No      Mobility  Bed Mobility Overal bed mobility: Needs Assistance Bed Mobility: Supine to Sit     Supine to sit: Mod assist;+2 for physical assistance     General bed mobility comments: Mod A to sit upright at EOB and to bring LE's EOB  Transfers Overall transfer level: Needs assistance Equipment used: Rolling walker (2 wheeled) Transfers: Sit to/from Stand Sit to Stand: Mod assist         General transfer comment: light Mod from EOB to RW  Ambulation/Gait Ambulation/Gait assistance: Min assist;+2 safety/equipment Ambulation Distance (Feet): 40 Feet Assistive device: Rolling walker (2  wheeled) Gait Pattern/deviations: Step-through pattern;Decreased step length - right;Decreased step length - left;Narrow base of support Gait velocity: decreased Gait velocity interpretation: Below normal speed for age/gender General Gait Details: step through sequencing. MIn A +2 for equipment management to bathroom and back to recliner.   Stairs            Wheelchair Mobility    Modified Rankin (Stroke Patients Only)       Balance Overall balance assessment: Needs assistance Sitting-balance support: No upper extremity supported;Feet supported Sitting balance-Leahy Scale: Fair     Standing balance support: Bilateral upper extremity supported Standing balance-Leahy Scale: Poor Standing balance comment: reliant on Rw for stability                             Pertinent Vitals/Pain Pain Assessment: No/denies pain    Home Living Family/patient expects to be discharged to:: Skilled nursing facility                 Additional Comments: Pt okay with going for short term rehab. Lives with daugther and son in law. May progress well enough to return home with hospital stay.     Prior Function Level of Independence: Independent with assistive device(s)         Comments: performed gait with RW and sometimes without per pt     Hand Dominance   Dominant Hand: Right    Extremity/Trunk Assessment   Upper Extremity Assessment Upper Extremity Assessment: Defer to OT evaluation    Lower Extremity  Assessment Lower Extremity Assessment: Generalized weakness    Cervical / Trunk Assessment Cervical / Trunk Assessment: Normal  Communication   Communication: No difficulties  Cognition Arousal/Alertness: Awake/alert Behavior During Therapy: WFL for tasks assessed/performed Overall Cognitive Status: Within Functional Limits for tasks assessed                                        General Comments      Exercises     Assessment/Plan     PT Assessment Patient needs continued PT services  PT Problem List Decreased strength;Decreased activity tolerance;Decreased balance;Decreased mobility;Decreased knowledge of use of DME       PT Treatment Interventions Gait training;Functional mobility training;Therapeutic activities;Therapeutic exercise;Balance training    PT Goals (Current goals can be found in the Care Plan section)  Acute Rehab PT Goals Patient Stated Goal: to go to rehab and then return home PT Goal Formulation: With patient Time For Goal Achievement: 01/17/17 Potential to Achieve Goals: Good    Frequency Min 2X/week   Barriers to discharge        Co-evaluation               AM-PAC PT "6 Clicks" Daily Activity  Outcome Measure Difficulty turning over in bed (including adjusting bedclothes, sheets and blankets)?: Total Difficulty moving from lying on back to sitting on the side of the bed? : Total Difficulty sitting down on and standing up from a chair with arms (e.g., wheelchair, bedside commode, etc,.)?: Total Help needed moving to and from a bed to chair (including a wheelchair)?: A Little Help needed walking in hospital room?: A Little Help needed climbing 3-5 steps with a railing? : A Lot 6 Click Score: 11    End of Session Equipment Utilized During Treatment: Gait belt Activity Tolerance: Patient tolerated treatment well Patient left: in chair;with call bell/phone within reach;with chair alarm set Nurse Communication: Mobility status PT Visit Diagnosis: Difficulty in walking, not elsewhere classified (R26.2);Muscle weakness (generalized) (M62.81)    Time: 3343-5686 PT Time Calculation (min) (ACUTE ONLY): 20 min   Charges:   PT Evaluation $PT Eval Moderate Complexity: 1 Procedure     PT G Codes:        Scheryl Marten PT, DPT  [phone]   Shanon Rosser 01/10/2017, 10:05 AM

## 2017-01-10 NOTE — Progress Notes (Signed)
Modified Barium Swallow Progress Note  Patient Details  Name: Ashley Velez MRN: [id number] Date of Birth: 06-12-1935  Today's Date: 01/10/2017  Modified Barium Swallow completed.  Full report located under Chart Review in the Imaging Section.  Brief recommendations include the following:  Clinical Impression  Patient presents with acute, reversible dysphagia s/p 4 day intubation. Mild oral deficits secondary to unavailability of dentures; moderate pharyngeal phase deficits due to sensory impairment, mildly reduced tongue base retraction. Swallow initiation delayed to the level of the pyriform sinuses with thin and nectar-thick liquids, resulting in consistent, deep laryngeal penetration which was not cleared from the laryngeal vestibule despite cues for throat clear, cough. No frank aspiration observed, however aspiration of penetrate diffused in secretions likely. For honey-thick liquids and solids, pt with improved bolus sensation and timely swallow initiation. Mild residue noted in the valleculae, pyriform sinuses with honey-thick liquids which is cleared with dry swallow. Recommend dys 2 diet with honey-thick liquids medications whole in puree for now; anticipate upgrade of liquids with additional time post-extubation, upgrade of solids when pt's dentures are available. SLP will f/u for tolerance, repeat instrumental as appropriate.   Swallow Evaluation Recommendations       SLP Diet Recommendations: Dysphagia 2 (Fine chop) solids;Honey thick liquids   Liquid Administration via: Cup;Straw   Medication Administration: Whole meds with puree   Supervision: Patient able to self feed;Intermittent supervision to cue for compensatory strategies   Compensations: Slow rate;Small sips/bites;Other (Comment) (intermittent dry swallow to clear residue)   Postural Changes: Seated upright at 90 degrees   Oral Care Recommendations: Oral care BID   Other Recommendations: Prohibited food  (jello, ice cream, thin soups);Order thickener from pharmacy;Remove water pitcher   Deneise Lever, [place], [place] Speech-Language Pathologist [place] 01/10/2017,1:12 PM

## 2017-01-10 NOTE — Progress Notes (Signed)
PULMONARY / CRITICAL CARE MEDICINE   Name: Ashley Velez MRN: [id number] DOB: 01/06/1935    ADMISSION DATE:  01/06/2017 CONSULTATION DATE:  01/06/2017  REFERRING MD:  Kathrynn Humble  CHIEF COMPLAINT:  Seizure  BRIEF:  82 y/o female admitted on 6/13 from a nursing home with seizure activity in setting of hypoglycemia.  Remained encephalopathic and hypertensive requiring intubation and ICU admission.   IMAGING/STUDIES: UDS 6/13:  Benzos Positive CT HEAD W/O 6/13:  No evidence of acute intracranial abnormality. Atrophy, chronic small-vessel white matter ischemic changes and remote right basal ganglia and right cerebellar infarcts. PORT CXR 6/14:  Previously reviewed by me. Questionable right midlung hazy opacity. Endotracheal tube in good position. Enteric feeding tube coursing below diaphragm with tip visualized in gastric fundus.  EEG 6/14:  Normal electroencephalogram, awake, drowsyand withoutactivation procedures. There are no focal lateralizing or epileptiform features. MRI BRAIN W/O 6/15:  No acute intracranial process. Old RIGHT basal basal ganglia and RIGHT cerebellar infarcts. Moderate to severe chronic small vessel ischemic disease.  MICROBIOLOGY: Urine Streptococcus Pneumoniae Antigen 6/13:  Negative MRSA PCR 6/14:  Negative  Blood Cultures x2 6/14 >>>  ANTIBIOTICS: Zosyn 6/15 >>>  SIGNIFICANT EVENTS: 06/13 - Admit & neurology consulted 06/14 - FUO >> cultured overnight. Zosyn started during rounds.  6/17 MBS  >> dysphagia 2 diet  LINES/TUBES: OETT 6/13 >>> 6/16 Foley 6/13 >>> OGT 6/13 >>>6/16   SUBJECTIVE:  Transferred to stepdown unit Sitting up have lunch with son-in-law feeding her  VITAL SIGNS: BP (!) 145/59 (BP Location: Left Arm)   Pulse 76   Temp 97.5 F (36.4 C) (Oral)   Resp 18   Ht 5\' 3"  (1.6 m)   Wt 118 lb 9.7 oz (53.8 kg)   SpO2 100%   BMI 21.01 kg/m   HEMODYNAMICS:    VENTILATOR SETTINGS:    INTAKE / OUTPUT: I/O last 3 completed  shifts: In: 1990 [P.O.:150; I.V.:1500; NG/GT:90; IV Piggyback:250] Out: 360 [Urine:360]  PHYSICAL EXAMINATION: General:  Resting comfortably, elderly, flat affect HEENT: No pallor/icterus PULM: CTA B CV: RRR, no mgr GI: BS+, soft, nontender MSK: normal bulk and tone Neuro: awake, alert, interactive, nonfocal  LABS:  BMET  Recent Labs Lab 01/08/17 0323 01/09/17 0532 01/10/17 0227  NA 136 141 135  137  K 3.7 3.5 4.1  3.8  CL 108 113* 110  110  CO2 19* 15* 13*  15*  BUN 22* 23* 22*  21*  CREATININE 1.85* 2.07* 1.80*  1.85*  GLUCOSE 138* 110* 101*  108*    Electrolytes  Recent Labs Lab 01/08/17 0323 01/09/17 0532 01/10/17 0227  CALCIUM 8.2* 7.3* 6.8*  7.0*  MG 2.3 2.0 2.0  PHOS 2.4* 3.5 3.3    CBC  Recent Labs Lab 01/08/17 0323 01/09/17 0532 01/10/17 0227  WBC 14.4* 13.0* 12.8*  HGB 9.7* 9.1* 9.4*  HCT 29.2* 28.8* 30.0*  PLT 259 223 288    Coag's  Recent Labs Lab 01/06/17 2000  APTT 29  INR 1.00    Sepsis Markers  Recent Labs Lab 01/07/17 0706 01/08/17 0323 01/09/17 0532  PROCALCITON <0.10 <0.10 <0.10    ABG  Recent Labs Lab 01/08/17 1015 01/09/17 0357  PHART 7.422 7.324*  PCO2ART 27.9* 21.0*  PO2ART 185* 78.0*    Liver Enzymes  Recent Labs Lab 01/06/17 2000 01/07/17 0230 01/08/17 0323 01/09/17 0532 01/10/17 0227  AST 26 21  --   --   --   ALT 14 14  --   --   --  ALKPHOS 48 43  --   --   --   BILITOT 0.4 0.7  --   --   --   ALBUMIN 4.2 3.5 2.8* 2.4* 2.4*    Cardiac Enzymes  Recent Labs Lab 01/06/17 2000  TROPONINI <0.03    Glucose  Recent Labs Lab 01/09/17 1613 01/09/17 2044 01/09/17 2320 01/10/17 0313 01/10/17 0746 01/10/17 1125  GLUCAP 127* 173* 90 112* 92 267*    Imaging US Renal  Result Date: 01/09/2017 CLINICAL DATA:  Acute renal insufficiency. EXAM: RENAL / URINARY TRACT ULTRASOUND COMPLETE COMPARISON:  None. FINDINGS: Right Kidney: Length: 9.6 cm. Cortical thinning with several  cysts and increased cortical echogenicity. The largest cyst measures 1.6 cm. Left Kidney: Length: 12 cm. Cortical thinning with increased cortical echogenicity and at least 1 mildly complicated 3 cm cysts. Bladder: Appears normal for degree of bladder distention. IMPRESSION: 1. Renal cysts. Medical renal disease. No hydronephrosis identified. 2. Small right pleural effusion incidentally identified. Electronically Signed   By: Dorise Bullion III M.D   On: 01/09/2017 19:30   Dg Swallowing Func-speech Pathology  Result Date: 01/10/2017 Objective Swallowing Evaluation: Type of Study: MBS-Modified Barium Swallow Study Patient Details Name: Ashley Velez MRN: [id number] Date of Birth: [birth year] Today's Date: 01/10/2017 Time: SLP Start Time (ACUTE ONLY): 1145-SLP Stop Time (ACUTE ONLY): 1200 SLP Time Calculation (min) (ACUTE ONLY): 15 min Past Medical History: Past Medical History: Diagnosis Date . Diabetes mellitus without complication ([place])  . Hypertension  . Stroke [facility])  Past Surgical History: No past surgical history on file. HPI: 82 y/o female admitted on 6/13 from a nursing home with seizure activity in setting of hypoglycemia. Remained encephalopathic and hypertensive requiring intubation and ICU admission. Intubated 6/13-6/16. 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 Subjective: upright in chair, pleasantly confused Assessment / Plan / Recommendation CHL IP CLINICAL IMPRESSIONS 01/10/2017 Clinical Impression Patient presents with acute, reversible dysphagia s/p 4 day intubation. Mild oral deficits secondary to unavailability of dentures; moderate pharyngeal phase deficits due to sensory impairment, mildly reduced tongue base retraction. Swallow initiation delayed to the level of the pyriform sinuses with thin and nectar-thick liquids, resulting in consistent, deep laryngeal penetration which was not cleared from the laryngeal  vestibule despite cues for throat clear, cough. No frank aspiration observed, however aspiration of penetrate diffused in secretions likely. For honey-thick liquids and solids, pt with improved bolus sensation and timely swallow initiation. Mild residue noted in the valleculae, pyriform sinuses with honey-thick liquids which is cleared with dry swallow. Recommend dys 2 diet with honey-thick liquids medications whole in puree for now; anticipate upgrade of liquids with additional time post-extubation, upgrade of solids when pt's dentures are available. SLP will f/u for tolerance, repeat instrumental as appropriate. SLP Visit Diagnosis Dysphagia, oropharyngeal phase (R13.12) Attention and concentration deficit following -- Frontal lobe and executive function deficit following -- Impact on safety and function Moderate aspiration risk   CHL IP TREATMENT RECOMMENDATION 01/10/2017 Treatment Recommendations Therapy as outlined in treatment plan below;F/U MBS in --- days (Comment)   Prognosis 01/10/2017 Prognosis for Safe Diet Advancement Good Barriers to Reach Goals Cognitive deficits Barriers/Prognosis Comment -- CHL IP DIET RECOMMENDATION 01/10/2017 SLP Diet Recommendations Dysphagia 2 (Fine chop) solids;Honey thick liquids Liquid Administration via Cup;Straw Medication Administration Whole meds with puree Compensations Slow rate;Small sips/bites;Other (Comment) Postural Changes Seated upright at 90 degrees   CHL IP OTHER RECOMMENDATIONS 01/10/2017 Recommended Consults -- Oral Care Recommendations  Oral care BID Other Recommendations Prohibited food (jello, ice cream, thin soups);Order thickener from pharmacy;Remove water pitcher   CHL IP FOLLOW UP RECOMMENDATIONS 01/10/2017 Follow up Recommendations Other (comment)   CHL IP FREQUENCY AND DURATION 01/10/2017 Speech Therapy Frequency (ACUTE ONLY) min 2x/week Treatment Duration 2 weeks      CHL IP ORAL PHASE 01/10/2017 Oral Phase Impaired Oral - Pudding Teaspoon -- Oral - Pudding  Cup -- Oral - Honey Teaspoon -- Oral - Honey Cup -- Oral - Nectar Teaspoon -- Oral - Nectar Cup -- Oral - Nectar Straw -- Oral - Thin Teaspoon -- Oral - Thin Cup -- Oral - Thin Straw -- Oral - Puree -- Oral - Mech Soft -- Oral - Regular Impaired mastication Oral - Multi-Consistency -- Oral - Pill -- Oral Phase - Comment --  CHL IP PHARYNGEAL PHASE 01/10/2017 Pharyngeal Phase Impaired Pharyngeal- Pudding Teaspoon -- Pharyngeal -- Pharyngeal- Pudding Cup -- Pharyngeal -- Pharyngeal- Honey Teaspoon -- Pharyngeal -- Pharyngeal- Honey Cup Pharyngeal residue - valleculae;Pharyngeal residue - pyriform;Reduced tongue base retraction Pharyngeal -- Pharyngeal- Nectar Teaspoon -- Pharyngeal -- Pharyngeal- Nectar Cup Reduced tongue base retraction;Penetration/Aspiration before swallow;Delayed swallow initiation-pyriform sinuses Pharyngeal Material enters airway, CONTACTS cords and not ejected out Pharyngeal- Nectar Straw Delayed swallow initiation-pyriform sinuses;Reduced tongue base retraction;Penetration/Aspiration before swallow Pharyngeal Material enters airway, CONTACTS cords and not ejected out Pharyngeal- Thin Teaspoon Delayed swallow initiation-pyriform sinuses;Reduced tongue base retraction;Penetration/Aspiration before swallow Pharyngeal Material enters airway, CONTACTS cords and not ejected out Pharyngeal- Thin Cup Delayed swallow initiation-pyriform sinuses;Reduced tongue base retraction;Penetration/Aspiration before swallow Pharyngeal Material enters airway, CONTACTS cords and not ejected out Pharyngeal- Thin Straw Delayed swallow initiation-pyriform sinuses;Reduced tongue base retraction;Penetration/Aspiration before swallow Pharyngeal Material enters airway, CONTACTS cords and not ejected out Pharyngeal- Puree WFL Pharyngeal -- Pharyngeal- Mechanical Soft -- Pharyngeal -- Pharyngeal- Regular WFL Pharyngeal -- Pharyngeal- Multi-consistency -- Pharyngeal -- Pharyngeal- Pill WFL Pharyngeal -- Pharyngeal Comment --   CHL IP CERVICAL ESOPHAGEAL PHASE 01/10/2017 Cervical Esophageal Phase WFL Pudding Teaspoon -- Pudding Cup -- Honey Teaspoon -- Honey Cup -- Nectar Teaspoon -- Nectar Cup -- Nectar Straw -- Thin Teaspoon -- Thin Cup -- Thin Straw -- Puree -- Mechanical Soft -- Regular -- Multi-consistency -- Pill -- Cervical Esophageal Comment -- No flowsheet data found. Deneise Lever, [place], CCC-SLP Speech-Language Pathologist [phone] Aliene Altes 01/10/2017, 1:12 PM                 DISCUSSION: 82 y/o female admitted on 6/13 from a nursing home with seizure activity in setting of hypoglycemia.  Remained encephalopathic and hypertensive requiring intubation and ICU admission.  ASSESSMENT / PLAN:  PULMONARY A: Acute respiratory failure with hypoxemia> resolved Aspiration pneumonia P:   dys 2 diet Aspiration precautions Zosyn x 5ds   RENAL A:   AKI, non-oliguric ,baseline 1.4 range P:   Bladder scan > if elevated then foley catheter Check urine Na and Cr Monitor BMET and UOP Replace electrolytes as needed   ENDOCRINE A:   Hypothyroidism Hypoglycemia prior to admission   Hyperglycemia post admission P:   Monitor glucose SSI  NEUROLOGIC A:   Acute encephalopathy> improved Seizure disorder P:   Continue keppra per neurology recommendations Resume zyprexa Pt recommending SNF  FAMILY  - Updates: daughter updated bedside 6/17  Can transfer to tele & to triad 6/18 Kara Mead MD. [facility] [place] [facility] Pager [phone] If no response call [phone]     01/10/2017, 3:18 PM

## 2017-01-11 DIAGNOSIS — N179 Acute kidney failure, unspecified: Secondary | ICD-10-CM

## 2017-01-11 DIAGNOSIS — J690 Pneumonitis due to inhalation of food and vomit: Secondary | ICD-10-CM

## 2017-01-11 DIAGNOSIS — I10 Essential (primary) hypertension: Secondary | ICD-10-CM

## 2017-01-11 LAB — GLUCOSE, CAPILLARY
GLUCOSE-CAPILLARY: 150 mg/dL — AB (ref 65–99)
GLUCOSE-CAPILLARY: 136 mg/dL — AB (ref 65–99)
GLUCOSE-CAPILLARY: 262 mg/dL — AB (ref 65–99)
GLUCOSE-CAPILLARY: 205 mg/dL — AB (ref 65–99)
GLUCOSE-CAPILLARY: 165 mg/dL — AB (ref 65–99)
Glucose-Capillary: 227 mg/dL — ABNORMAL HIGH (ref 65–99)
Glucose-Capillary: 96 mg/dL (ref 65–99)
Glucose-Capillary: 109 mg/dL — ABNORMAL HIGH (ref 65–99)

## 2017-01-11 LAB — BASIC METABOLIC PANEL
Anion gap: 7 (ref 5–15)
BUN: 26 mg/dL — AB (ref 6–20)
CALCIUM: 7 mg/dL — AB (ref 8.9–10.3)
CO2: 17 mmol/L — ABNORMAL LOW (ref 22–32)
CREATININE: 1.67 mg/dL — AB (ref 0.44–1.00)
Chloride: 112 mmol/L — ABNORMAL HIGH (ref 101–111)
GFR calc Af Amer: 32 mL/min — ABNORMAL LOW (ref >60)
GFR, EST NON AFRICAN AMERICAN: 28 mL/min — AB (ref >60)
Glucose, Bld: 123 mg/dL — ABNORMAL HIGH (ref 65–99)
POTASSIUM: 3.8 mmol/L (ref 3.5–5.1)
SODIUM: 136 mmol/L (ref 135–145)

## 2017-01-11 LAB — RENAL FUNCTION PANEL
Albumin: 2.2 g/dL — ABNORMAL LOW (ref 3.5–5.0)
Anion gap: 7 (ref 5–15)
BUN: 27 mg/dL — AB (ref 6–20)
CALCIUM: 7 mg/dL — AB (ref 8.9–10.3)
CO2: 17 mmol/L — ABNORMAL LOW (ref 22–32)
CREATININE: 1.69 mg/dL — AB (ref 0.44–1.00)
Chloride: 113 mmol/L — ABNORMAL HIGH (ref 101–111)
GFR, EST AFRICAN AMERICAN: 32 mL/min — AB (ref >60)
GFR, EST NON AFRICAN AMERICAN: 27 mL/min — AB (ref >60)
Glucose, Bld: 122 mg/dL — ABNORMAL HIGH (ref 65–99)
Phosphorus: 2.9 mg/dL (ref 2.5–4.6)
Potassium: 3.8 mmol/L (ref 3.5–5.1)
SODIUM: 137 mmol/L (ref 135–145)

## 2017-01-11 LAB — CBC WITH DIFFERENTIAL/PLATELET
Basophils Absolute: 0 10*3/uL (ref 0.0–0.1)
Basophils Relative: 0 %
EOS ABS: 0.4 10*3/uL (ref 0.0–0.7)
EOS PCT: 4 %
HCT: 28.4 % — ABNORMAL LOW (ref 36.0–46.0)
Hemoglobin: 9.3 g/dL — ABNORMAL LOW (ref 12.0–15.0)
LYMPHS ABS: 1.9 10*3/uL (ref 0.7–4.0)
Lymphocytes Relative: 18 %
MCH: 27.9 pg (ref 26.0–34.0)
MCHC: 32.7 g/dL (ref 30.0–36.0)
MCV: 85.3 fL (ref 78.0–100.0)
MONO ABS: 1.4 10*3/uL — AB (ref 0.1–1.0)
Monocytes Relative: 13 %
Neutro Abs: 6.6 10*3/uL (ref 1.7–7.7)
Neutrophils Relative %: 65 %
PLATELETS: 297 10*3/uL (ref 150–400)
RBC: 3.33 MIL/uL — AB (ref 3.87–5.11)
RDW: 15.3 % (ref 11.5–15.5)
WBC: 10.4 10*3/uL (ref 4.0–10.5)

## 2017-01-11 LAB — MAGNESIUM: Magnesium: 2.1 mg/dL (ref 1.7–2.4)

## 2017-01-11 MED ORDER — SODIUM CHLORIDE 0.9 % IV SOLN
INTRAVENOUS | Status: AC
  Administered 2017-01-11: 20:00:00 via INTRAVENOUS

## 2017-01-11 MED ORDER — AMOXICILLIN-POT CLAVULANATE 500-125 MG PO TABS
1.0000 | ORAL_TABLET | Freq: Two times a day (BID) | ORAL | Status: DC
  Administered 2017-01-11 – 2017-01-12 (×3): 500 mg via ORAL
  Filled 2017-01-11 (×4): qty 1

## 2017-01-11 MED ORDER — INSULIN ASPART 100 UNIT/ML ~~LOC~~ SOLN
0.0000 [IU] | Freq: Three times a day (TID) | SUBCUTANEOUS | Status: DC
  Administered 2017-01-12: 2 [IU] via SUBCUTANEOUS
  Administered 2017-01-12: 3 [IU] via SUBCUTANEOUS

## 2017-01-11 MED ORDER — SIMVASTATIN 20 MG PO TABS
20.0000 mg | ORAL_TABLET | Freq: Every day | ORAL | Status: DC
  Administered 2017-01-11: 20 mg via ORAL
  Filled 2017-01-11: qty 1

## 2017-01-11 MED ORDER — PANTOPRAZOLE SODIUM 40 MG PO PACK
40.0000 mg | PACK | ORAL | Status: DC
  Administered 2017-01-11 – 2017-01-12 (×2): 40 mg via ORAL
  Filled 2017-01-11: qty 20

## 2017-01-11 MED ORDER — LEVOTHYROXINE SODIUM 50 MCG PO TABS
50.0000 ug | ORAL_TABLET | Freq: Every day | ORAL | Status: DC
  Administered 2017-01-12: 50 ug via ORAL
  Filled 2017-01-11: qty 1

## 2017-01-11 MED ORDER — INSULIN ASPART 100 UNIT/ML ~~LOC~~ SOLN: 0.0000 [IU] | Freq: Every day | SUBCUTANEOUS | Status: DC

## 2017-01-11 MED ORDER — AMOXICILLIN-POT CLAVULANATE 875-125 MG PO TABS: 1.0000 | ORAL_TABLET | Freq: Two times a day (BID) | ORAL | Status: DC

## 2017-01-11 NOTE — Care Management Important Message (Signed)
Important Message  Patient Details  Name: Ashley Velez MRN: [id number] Date of Birth: 11-16-1934   Medicare Important Message Given:  Yes    Wilene Pharo [place] 01/11/2017, 1:40 PM

## 2017-01-11 NOTE — NC FL2 (Signed)
[place] MEDICAID FL2 LEVEL OF CARE SCREENING TOOL     IDENTIFICATION  Patient Name: Ashley Velez Birthdate: 1935-07-03 Sex: female Admission Date (Current Location): 01/06/2017  [facility] [place] Number:  Herbalist and Address:  [facility], [place] [address], [place], [place] [address]      Provider Number: [id number]  Attending Physician Name and Address:  Louellen Molder, MD  Relative Name and Phone Number:       Current Level of Care: Hospital Recommended Level of Care: [place] Prior Approval Number:    Date Approved/Denied:   PASRR Number: [id number]  Discharge Plan: SNF    Current Diagnoses: Patient Active Problem List   Diagnosis Date Noted  . Seizure ([place]) 01/06/2017    Orientation RESPIRATION BLADDER Height & Weight     Self  Normal Continent Weight: 118 lb 9.7 oz (53.8 kg) Height:  5\' 3"  (160 cm)  BEHAVIORAL SYMPTOMS/MOOD NEUROLOGICAL BOWEL NUTRITION STATUS   (None) Convulsions/Seizures Continent Diet (DYS 2, Fluid nectar thick. meds whole in puree.)  AMBULATORY STATUS COMMUNICATION OF NEEDS Skin   Limited Assist Verbally Bruising                       Personal Care Assistance Level of Assistance  Bathing, Feeding, Dressing Bathing Assistance: Limited assistance Feeding assistance: Limited assistance Dressing Assistance: Limited assistance     Functional Limitations Info  Sight, Hearing, Speech Sight Info: Adequate Hearing Info: Adequate Speech Info: Adequate    SPECIAL CARE FACTORS FREQUENCY  PT (By licensed PT), Speech therapy     PT Frequency: 5 x week       Speech Therapy Frequency: 5 x week      Contractures Contractures Info: Not present    Additional Factors Info  Code Status, Allergies Code Status Info: Full Allergies Info: Fosamax (Alendronate), Macrodantin (Nitrofurantoin)           Current Medications (01/11/2017):  This is the current hospital active  medication list Current Facility-Administered Medications  Medication Dose Route Frequency Provider Last Rate Last Dose  . 0.9 %  sodium chloride infusion   Intravenous Continuous Dhungel, Nishant, MD 75 mL/hr at 01/11/17 0745 75 mL/hr at 01/11/17 0745  . acetaminophen (TYLENOL) solution 650 mg  650 mg Per Tube Q6H PRN Juanito Doom, MD   650 mg at 01/07/17 2323  . amoxicillin-clavulanate (AUGMENTIN) 500-125 MG per tablet 500 mg  1 tablet Oral BID Rolla Flatten, RPH      . carvedilol (COREG) tablet 12.5 mg  12.5 mg Oral BID Chesley Mires, MD   12.5 mg at 01/11/17 0906  . clopidogrel (PLAVIX) tablet 75 mg  75 mg Per Tube Daily Chesley Mires, MD   75 mg at 01/11/17 0906  . dextrose 50 % solution   Intravenous PRN Kathrynn Humble, Ankit, MD   25 mL at 01/06/17 2025  . felodipine (PLENDIL) 24 hr tablet 5 mg  5 mg Oral Daily Rigoberto Noel, MD   5 mg at 01/11/17 0906  . heparin injection 5,000 Units  5,000 Units Subcutaneous Q8H Javier Glazier, MD   5,000 Units at 01/11/17 0636  . hydrALAZINE (APRESOLINE) injection 10-40 mg  10-40 mg Intravenous Q4H PRN Juanito Doom, MD   20 mg at 01/09/17 2150  . insulin aspart (novoLOG) injection 0-15 Units  0-15 Units Subcutaneous Q4H Chesley Mires, MD   2 Units at 01/11/17 1143  . labetalol (NORMODYNE,TRANDATE) injection 10  mg  10 mg Intravenous Q2H PRN Chesley Mires, MD   10 mg at 01/09/17 0252  . [START ON 01/12/2017] levothyroxine (SYNTHROID, LEVOTHROID) tablet 50 mcg  50 mcg Oral QAC breakfast Dhungel, Nishant, MD      . OLANZapine (ZYPREXA) tablet 15 mg  15 mg Oral QHS Rigoberto Noel, MD   15 mg at 01/10/17 2056  . pantoprazole sodium (PROTONIX) 40 mg/20 mL oral suspension 40 mg  40 mg Oral Q24H Dhungel, Nishant, MD   40 mg at 01/11/17 0906  . RESOURCE THICKENUP CLEAR   Oral PRN Chesley Mires, MD      . simvastatin (ZOCOR) tablet 20 mg  20 mg Oral QHS Dhungel, Nishant, MD         Discharge Medications: Please see discharge summary for a list of  discharge medications.  Relevant Imaging Results:  Relevant Lab Results:   Additional Information SS#: [id number]  Candie Chroman, LCSW

## 2017-01-11 NOTE — Progress Notes (Signed)
PHARMACY NOTE:  ANTIMICROBIAL RENAL DOSAGE ADJUSTMENT  Current antimicrobial regimen includes a mismatch between antimicrobial dosage and estimated renal function.  As per policy approved by the Pharmacy & Therapeutics and Medical Executive Committees, the antimicrobial dosage will be adjusted accordingly.  Current antimicrobial dosage:  Augmentin 875 mg twice daily  Indication: Aspiration PNA  Renal Function:  Estimated Creatinine Clearance: 22 mL/min (A) (by C-G formula based on SCr of 1.69 mg/dL (H)). []      On intermittent HD, scheduled: []      On CRRT    Antimicrobial dosage has been changed to:  Augmentin 500 mg twice daily  Additional comments:   Thank you for allowing pharmacy to be a part of this patient's care.  Lawson Radar, [facility] 01/11/2017 11:45 AM

## 2017-01-11 NOTE — Clinical Social Work Note (Signed)
Clinical Social Work Assessment  Patient Details  Name: Ashley Velez MRN: [id number] Date of Birth: Mar 06, 1935  Date of referral:  01/11/17               Reason for consult:  Discharge Planning                Permission sought to share information with:  Facility Sport and exercise psychologist, Family Supports Permission granted to share information::  Yes, Verbal Permission Granted  Name::     [place]  Agency::  [facility]  Relationship::  Daughter  Contact Information:  [phone]  Housing/Transportation Living arrangements for the past 2 months:  [place], [place] of Information:  Medical Team, Adult Children Patient Interpreter Needed:  None Criminal Activity/Legal Involvement Pertinent to Current Situation/Hospitalization:  No - Comment as needed Significant Relationships:  Adult Children Lives with:  Adult Children, Facility Resident Do you feel safe going back to the place where you live?  Yes Need for family participation in patient care:  Yes (Comment)  Care giving concerns:  Patient is a short-term rehab resident at [facility]. PT recommending patient return to SNF once stable for discharge.   Social Worker assessment / plan:  Patient oriented to self only. CSW called patient's daughter. CSW introduced role and explained that PT recommendations would be discussed. Patient's daughter confirmed that patient was admitted from [facility] and the plan is for her to return once stable for discharge. Per patient's daughter, she was originally admitted to SNF on 6/9. Patient will need PTAR. No further concerns. CSW encouraged patient's daughter to contact CSW as needed. CSW will continue to follow patient and her daughter for support and facilitate discharge to SNF once medically stable.  Employment status:  Retired Nurse, adult PT Recommendations:  [place] / Referral to community  resources:  [facility]  Patient/Family's Response to care:  Patient oriented to self only. Patient's daughter agreeable to return to SNF. Patient's daughter supportive and involved in patient's care. Patient's daughter appreciated social work intervention.  Patient/Family's Understanding of and Emotional Response to Diagnosis, Current Treatment, and Prognosis:  Patient oriented to self only. Patient's daughter has a good understanding of the reason for admission and her need to return to rehab prior to going back home. Patient's daughter appears happy with hospital care.  Emotional Assessment Appearance:  Appears stated age Attitude/Demeanor/Rapport:  Unable to Assess Affect (typically observed):  Unable to Assess Orientation:  Oriented to Self Alcohol / Substance use:  Never Used Psych involvement (Current and /or in the community):  No (Comment)  Discharge Needs  Concerns to be addressed:  Care Coordination Readmission within the last 30 days:  No Current discharge risk:  Cognitively Impaired, Dependent with Mobility Barriers to Discharge:  Continued Medical Work up   Candie Chroman, LCSW 01/11/2017, 12:32 PM

## 2017-01-11 NOTE — Progress Notes (Signed)
Report received from RN, Pt to be transferred to [place] room 4

## 2017-01-11 NOTE — Care Management Note (Addendum)
Case Management Note  Patient Details  Name: Ashley Velez MRN: [id number] Date of Birth: 05-Oct-1934  Subjective/Objective:   From  [facility], presents with seizure and hypoglycemia, she  Remained with  encephalopathy and hypertensive, was intubated.   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  Referral for CSW. She has [facility] [employer].  PCP  Crist Infante             Action/Plan: NCM will follow along with CSW for dc needs.  Expected Discharge Date:                  Expected Discharge Plan:  [place] (From SNF)  In-House Referral:  Clinical Social Work  Discharge planning Services  CM Consult  Post Acute Care Choice:    Choice offered to:     DME Arranged:    DME Agency:     HH Arranged:    [place] Agency:     Status of Service:  Completed, signed off  If discussed at [employer], dates discussed:    Additional Comments:  Zenon Mayo, RN 01/11/2017, 8:42 AM

## 2017-01-11 NOTE — Progress Notes (Addendum)
Inpatient Diabetes Program Recommendations  AACE/ADA: New Consensus Statement on Inpatient Glycemic Control (2015)  Target Ranges:  Prepandial:   less than 140 mg/dL      Peak postprandial:   less than 180 mg/dL (1-2 hours)      Critically ill patients:  140 - 180 mg/dL   Lab Results  Component Value Date   GLUCAP 109 (H) 01/11/2017    Review of Glycemic Control  Diabetes history: DM2 Outpatient Diabetes medications: Metaglip 2.5-500 + Humalog bid correction scale  Current orders for Inpatient glycemic control: Novolog correction 0-15 units q 4 hrs.  Inpatient Diabetes Program Recommendations:  Noted admitted with hypoglycemia <20. Please consider: -A1c to determine prehospital glycemic control and possible need for medication adjustment -Lantus 8 units qd (0.1 x 53.8 kg)  Thank you, Bethena Roys E. Krystl Wickware, RN, MSN, CDE  Diabetes Coordinator Inpatient Glycemic Control Team Team Pager [phone] (8am-5pm) 01/11/2017 11:32 AM

## 2017-01-11 NOTE — Progress Notes (Signed)
Orders reviewed

## 2017-01-11 NOTE — Progress Notes (Signed)
NURSING PROGRESS NOTE  Rachelle Edwards [id number] Transfer Data: 01/11/2017 12:48 PM Attending Provider: Louellen Molder, MD QJF:[id number], Elta [place], MD Code Status: FULL   Runette Scifres is a 82 y.o. female patient transferred from [address]  -No acute distress noted.  -No complaints of shortness of breath.  -No complaints of chest pain.   Cardiac Monitoring: Box # 10 in place. Cardiac monitor yields:normal sinus rhythm.  Last Documented Vital Signs: Blood pressure (!) 158/60, pulse 62, temperature 98.2 F (36.8 C), temperature source Oral, resp. rate (!) 24, height 5\' 3"  (1.6 m), weight 53.8 kg (118 lb 9.7 oz), SpO2 98 %.  IV Fluids:  IV in place, occlusive dsg intact without redness, IV cath forearm right, condition patent and no redness and left, condition patent and no redness normal saline.   Allergies:  Fosamax [alendronate] and Macrodantin [nitrofurantoin]  Past Medical History:   has a past medical history of Diabetes mellitus without complication ([place]); Hypertension; and Stroke [facility]).  Past Surgical History:   has no past surgical history on file.  Social History:   reports that she has never smoked. She has never used smokeless tobacco. She reports that she does not drink alcohol or use drugs.  Skin: intact except where otherwise charted  Patient/Family orientated to room. Information packet given to patient/family. Admission inpatient armband information verified with patient/family to include name and date of birth and placed on patient arm. Side rails up x 2, fall assessment and education completed with patient/family. Patient/family able to verbalize understanding of risk associated with falls and verbalized understanding to call for assistance before getting out of bed. Call light within reach. Patient/family able to voice and demonstrate understanding of unit orientation instructions.

## 2017-01-11 NOTE — Progress Notes (Signed)
PROGRESS NOTE                                                                                                                                                                                                             Patient Demographics:    Ashley Velez, is a 82 y.o. female, DOB - 04-15-1935, QZE:[id number]  Admit date - 01/06/2017   Admitting Physician Chesley Mires, MD  Outpatient Primary MD for the patient is Crist Infante, MD  LOS - 5  Outpatient Specialists:None  Chief Complaint  Patient presents with  . Seizures       Brief Narrative   82 year old female with history of diabetes mellitus on metformin admitted to ICU with seizures likely in the setting of hypoglycemia. In the nursing home her blood glucose was 340 and given 10 units of insulin, glucose subsequently dropped to the 60s and patient had seizures. She came unresponsive and was intubated for airway protection. Her blood pressure was in the 200s. Patient admitted to ICU. Patient also developed fever the next day with concerns for aspiration pneumonia and started on empiric antibiotics. Head CT and MRI brain without acute findings. EEG negative for acute seizures. Patient transferred to hospitalist service on 6/18.   Subjective:   Patient denies any headache, blurred vision, dizziness, shortness of breath or chest discomfort. Stable on monitor.   Assessment  & Plan :    Principal problem Acute respiratory failure with hypoxemia Secondary to acute seizures with metabolic encephalopathy. Was intubated in ICU for any protection. Also developed aspiration pneumonia. Now on oral Zosyn. Cultures negative. Patient currently maintaining sats on room air. Transfer to telemetry.  Acute hypoglycemic seizures EEG negative for epileptiform activity.. Neurology consult appreciated. Was placed on Keppra initially, now discontinued. MRI brain negative for acute  findings, PINS and showed old right basal ganglia and right cerebellar infarct. Metformin and glipizide discontinued. Monitor on sliding scale coverage. CBG stable. Check A1c.  Aspiration pneumonia/pneumonitis   on empiric Zosyn. Cultures negative. Transition to Augmentin .  Acute kidney injury Possibly due to dehydration. Resume IV fluids and monitor. Avoid nephrotoxins. Diovan held.  Acute metabolic encephalopathy secondary to seizure. Now resolved.  Hypertensive emergency On admission. Elevated blood pressure today.  Continue Coreg and felodipine. ARB on hold due to acute kidney injury. Continue when necessary IV hydralazine and labetalol.  History of cerebellar stroke Continue Plavix and statin.   Code Status : Full code  Family Communication  : None at bedside  Disposition Plan  : Possible discharge to SNF tomorrow  Barriers For Discharge : Improving symptoms  Consults  :   [facility] CM Neurology  Procedures  : CT head/MRI brain EEG  DVT Prophylaxis  :  Lovenox -   Lab Results  Component Value Date   PLT 297 01/11/2017    Antibiotics  :   Anti-infectives    Start     Dose/Rate Route Frequency Ordered Stop   01/10/17 1400  piperacillin-tazobactam (ZOSYN) IVPB 3.375 g     3.375 g 12.5 mL/hr over 240 Minutes Intravenous Every 8 hours 01/10/17 1149     01/08/17 0900  piperacillin-tazobactam (ZOSYN) IVPB 2.25 g  Status:  Discontinued     2.25 g 100 mL/hr over 30 Minutes Intravenous Every 6 hours 01/08/17 0815 01/10/17 1149   01/07/17 0900  piperacillin-tazobactam (ZOSYN) IVPB 3.375 g  Status:  Discontinued     3.375 g 12.5 mL/hr over 240 Minutes Intravenous Every 8 hours 01/07/17 0803 01/08/17 0854        Objective:   Vitals:   01/10/17 1908 01/10/17 2327 01/11/17 0302 01/11/17 0735  BP: (!) 107/56 (!) 151/67 (!) 169/72 (!) 177/63  Pulse: 73 67 68   Resp: 11 (!) 21 20   Temp: 98.3 F (36.8 C) 98.7 F (37.1 C) 98.3 F (36.8 C) 98.1 F (36.7 C)  TempSrc:  Oral Oral Oral Oral  SpO2: 98% 100% 100% 100%  Weight:      Height:        Wt Readings from Last 3 Encounters:  01/10/17 53.8 kg (118 lb 9.7 oz)  03/11/15 49.9 kg (110 lb)     Intake/Output Summary (Last 24 hours) at 01/11/17 1126 Last data filed at 01/11/17 0636  Gross per 24 hour  Intake              850 ml  Output                0 ml  Net              850 ml     Physical Exam  Gen: Elderly female not in distress HEENT: no pallor, moist mucosa, supple neck Chest: clear b/l, no added sounds CVS: N S1&S2, no murmurs, rubs or gallop GI: soft, NT, ND,  Musculoskeletal: warm, no edema CNS: Alert and oriented, nonfocal    Data Review:    CBC  Recent Labs Lab 01/07/17 0230 01/08/17 0323 01/09/17 0532 01/10/17 0227 01/11/17 0202  WBC 16.6* 14.4* 13.0* 12.8* 10.4  HGB 11.0* 9.7* 9.1* 9.4* 9.3*  HCT 34.3* 29.2* 28.8* 30.0* 28.4*  PLT 299 259 223 288 297  MCV 86.4 85.4 88.1 88.0 85.3  MCH 27.7 28.4 27.8 27.6 27.9  MCHC 32.1 33.2 31.6 31.3 32.7  RDW 14.8 15.2 15.6* 15.7* 15.3  LYMPHSABS  --  1.7 1.4 2.1 1.9  MONOABS  --  1.4* 1.2* 1.4* 1.4*  EOSABS  --  0.1 0.2 0.5 0.4  BASOSABS  --  0.0 0.0 0.0 0.0    Chemistries   Recent Labs Lab 01/06/17 2000  01/07/17 0230 01/08/17 0323 01/09/17 0532 01/10/17 0227 01/11/17 0202  NA 140  < > 140 136 141 135  137 136  137  K 3.9  < > 3.6 3.7 3.5 4.1  3.8 3.8  3.8  CL 107  < >  107 108 113* 110  110 112*  113*  CO2 23  --  23 19* 15* 13*  15* 17*  17*  GLUCOSE <20*  < > 162* 138* 110* 101*  108* 123*  122*  BUN 24*  < > 19 22* 23* 22*  21* 26*  27*  CREATININE 1.57*  < > 1.50* 1.85* 2.07* 1.80*  1.85* 1.67*  1.69*  CALCIUM 9.7  --  9.1 8.2* 7.3* 6.8*  7.0* 7.0*  7.0*  MG 1.9  --  1.6* 2.3 2.0 2.0 2.1  AST 26  --  21  --   --   --   --   ALT 14  --  14  --   --   --   --   ALKPHOS 48  --  43  --   --   --   --   BILITOT 0.4  --  0.7  --   --   --   --   < > = values in this interval not  displayed. ------------------------------------------------------------------------------------------------------------------ No results for input(s): CHOL, HDL, LDLCALC, TRIG, CHOLHDL, LDLDIRECT in the last 72 hours.  No results found for: HGBA1C ------------------------------------------------------------------------------------------------------------------ No results for input(s): TSH, T4TOTAL, T3FREE, THYROIDAB in the last 72 hours.  Invalid input(s): FREET3 ------------------------------------------------------------------------------------------------------------------ No results for input(s): VITAMINB12, FOLATE, FERRITIN, TIBC, IRON, RETICCTPCT in the last 72 hours.  Coagulation profile  Recent Labs Lab 01/06/17 2000  INR 1.00    No results for input(s): DDIMER in the last 72 hours.  Cardiac Enzymes  Recent Labs Lab 01/06/17 2000  TROPONINI <0.03   ------------------------------------------------------------------------------------------------------------------ No results found for: BNP  Inpatient Medications  Scheduled Meds: . carvedilol  12.5 mg Oral BID  . clopidogrel  75 mg Per Tube Daily  . felodipine  5 mg Oral Daily  . heparin subcutaneous  5,000 Units Subcutaneous Q8H  . insulin aspart  0-15 Units Subcutaneous Q4H  . [START ON 01/12/2017] levothyroxine  50 mcg Oral QAC breakfast  . OLANZapine  15 mg Oral QHS  . pantoprazole sodium  40 mg Oral Q24H  . simvastatin  20 mg Oral QHS   Continuous Infusions: . sodium chloride 75 mL/hr (01/11/17 0745)  . piperacillin-tazobactam (ZOSYN)  IV Stopped (01/11/17 1036)   PRN Meds:.acetaminophen (TYLENOL) oral liquid 160 mg/5 mL, dextrose, hydrALAZINE, labetalol, RESOURCE THICKENUP CLEAR  Micro Results Recent Results (from the past 240 hour(s))  Culture, blood (Routine X 2) w Reflex to ID Panel     Status: None (Preliminary result)   Collection Time: 01/07/17  2:02 AM  Result Value Ref Range Status   Specimen  Description BLOOD BLOOD RIGHT FOREARM  Final   Special Requests   Final    BOTTLES DRAWN AEROBIC AND ANAEROBIC Blood Culture adequate volume   Culture NO GROWTH 3 DAYS  Final   Report Status PENDING  Incomplete  MRSA PCR Screening     Status: None   Collection Time: 01/07/17  2:05 AM  Result Value Ref Range Status   MRSA by PCR NEGATIVE NEGATIVE Final    Comment:        The GeneXpert MRSA Assay (FDA approved for NASAL specimens only), is one component of a comprehensive MRSA colonization surveillance program. It is not intended to diagnose MRSA infection nor to guide or monitor treatment for MRSA infections.   Culture, blood (Routine X 2) w Reflex to ID Panel     Status: None (Preliminary result)   Collection Time: 01/07/17  2:45  AM  Result Value Ref Range Status   Specimen Description BLOOD LEFT HAND  Final   Special Requests IN PEDIATRIC BOTTLE Blood Culture adequate volume  Final   Culture NO GROWTH 3 DAYS  Final   Report Status PENDING  Incomplete    Radiology Reports Ct Head Wo Contrast  Result Date: 01/06/2017 CLINICAL DATA:  82 year old female with seizure like activity and altered mental status. EXAM: CT HEAD WITHOUT CONTRAST TECHNIQUE: Contiguous axial images were obtained from the base of the skull through the vertex without intravenous contrast. COMPARISON:  03/11/2015 head CT FINDINGS: Brain: No evidence of acute infarction, hemorrhage, hydrocephalus, extra-axial collection or mass lesion/mass effect. Atrophy, chronic small-vessel white matter ischemic changes and remote right basal ganglia and cerebellar infarcts again noted. Vascular: Intracranial atherosclerotic calcifications noted. Skull: Normal. Negative for fracture or focal lesion. Sinuses/Orbits: No acute finding. Other: None. IMPRESSION: No evidence of acute intracranial abnormality. Atrophy, chronic small-vessel white matter ischemic changes and remote right basal ganglia and right cerebellar infarcts.  Electronically Signed   By: Margarette Canada M.D.   On: 01/06/2017 21:07   Mr Brain Wo Contrast  Result Date: 01/08/2017 CLINICAL DATA:  Seizure type activity at care facility, hyperglycemia. History of hypertension and diabetes. EXAM: MRI HEAD WITHOUT CONTRAST TECHNIQUE: Multiplanar, multiecho pulse sequences of the brain and surrounding structures were obtained without intravenous contrast. COMPARISON:  CT HEAD January 06, 2017 and MRI of the head May 22, 2013 FINDINGS: BRAIN: No reduced diffusion to suggest acute ischemia. Old RIGHT basal ganglia/corona radiata infarct with mineralization, no susceptibility artifact to suggest hemorrhage. Old small RIGHT cerebellar infarcts. Prominent basal ganglia perivascular spaces associated with chronic small vessel ischemic disease. Patchy supratentorial and pontine white matter FLAIR T2 hyperintensities, relatively unchanged. No midline shift, mass effect or masses. Wallerian degeneration evident RIGHT cerebral peduncle. No abnormal extra-axial fluid collections. VASCULAR: Normal major intracranial vascular flow voids present at skull base. SKULL AND UPPER CERVICAL SPINE: No abnormal sellar expansion. No suspicious calvarial bone marrow signal. Craniocervical junction maintained. SINUSES/ORBITS: Trace paranasal sinus mucosal thickening . bilateral mastoid effusions. The included ocular globes and orbital contents are non-suspicious. OTHER: Patient is edentulous.  Life-support lines in place. IMPRESSION: No acute intracranial process. Old RIGHT basal basal ganglia and RIGHT cerebellar infarcts. Moderate to severe chronic small vessel ischemic disease. Electronically Signed   By: Elon Alas M.D.   On: 01/08/2017 01:51   US Renal  Result Date: 01/09/2017 CLINICAL DATA:  Acute renal insufficiency. EXAM: RENAL / URINARY TRACT ULTRASOUND COMPLETE COMPARISON:  None. FINDINGS: Right Kidney: Length: 9.6 cm. Cortical thinning with several cysts and increased cortical  echogenicity. The largest cyst measures 1.6 cm. Left Kidney: Length: 12 cm. Cortical thinning with increased cortical echogenicity and at least 1 mildly complicated 3 cm cysts. Bladder: Appears normal for degree of bladder distention. IMPRESSION: 1. Renal cysts. Medical renal disease. No hydronephrosis identified. 2. Small right pleural effusion incidentally identified. Electronically Signed   By: Dorise Bullion III M.D   On: 01/09/2017 19:30   Dg Chest Port 1 View  Result Date: 01/07/2017 CLINICAL DATA:  Respiratory failure, history of CVA, hypertension, diabetes. EXAM: PORTABLE CHEST 1 VIEW COMPARISON:  Chest x-ray of January 06, 2017 FINDINGS: The lungs are well-expanded and clear. The heart and pulmonary vascularity are normal. There is calcification in the wall of the aortic arch. There is no pleural effusion or pneumothorax. The observed portions of the bony thorax exhibit no acute abnormalities. The endotracheal tube tip lies 3.8 cm  above the carina. The esophagogastric tube tip and proximal port lie in the gastric cardia. IMPRESSION: Intubated patient.  No acute cardiopulmonary abnormality. Thoracic aortic atherosclerosis. Electronically Signed   By: David  Martinique M.D.   On: 01/07/2017 07:44   Dg Chest Portable 1 View  Result Date: 01/06/2017 CLINICAL DATA:  Post invasion, status post seizure EXAM: PORTABLE CHEST 1 VIEW COMPARISON:  CT chest dated 10/30/2016 FINDINGS: Lungs are clear.  No pleural effusion or pneumothorax. Endotracheal tube terminates 5.5 cm above the carina. Heart is normal in size. Enteric tube terminates in the gastric cardia. IMPRESSION: Endotracheal tube terminates 5.5 cm above the carina. Enteric tube terminates in the gastric cardia. Electronically Signed   By: Julian Hy M.D.   On: 01/06/2017 20:03   Dg Swallowing Func-speech Pathology  Result Date: 01/10/2017 Objective Swallowing Evaluation: Type of Study: MBS-Modified Barium Swallow Study Patient Details Name:  Ashley Velez MRN: [id number] Date of Birth: [birth year] Today's Date: 01/10/2017 Time: SLP Start Time (ACUTE ONLY): 1145-SLP Stop Time (ACUTE ONLY): 1200 SLP Time Calculation (min) (ACUTE ONLY): 15 min Past Medical History: Past Medical History: Diagnosis Date . Diabetes mellitus without complication ([place])  . Hypertension  . Stroke [facility])  Past Surgical History: No past surgical history on file. HPI: 82 y/o female admitted on 6/13 from a nursing home with seizure activity in setting of hypoglycemia. Remained encephalopathic and hypertensive requiring intubation and ICU admission. Intubated 6/13-6/16. 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 Subjective: upright in chair, pleasantly confused Assessment / Plan / Recommendation CHL IP CLINICAL IMPRESSIONS 01/10/2017 Clinical Impression Patient presents with acute, reversible dysphagia s/p 4 day intubation. Mild oral deficits secondary to unavailability of dentures; moderate pharyngeal phase deficits due to sensory impairment, mildly reduced tongue base retraction. Swallow initiation delayed to the level of the pyriform sinuses with thin and nectar-thick liquids, resulting in consistent, deep laryngeal penetration which was not cleared from the laryngeal vestibule despite cues for throat clear, cough. No frank aspiration observed, however aspiration of penetrate diffused in secretions likely. For honey-thick liquids and solids, pt with improved bolus sensation and timely swallow initiation. Mild residue noted in the valleculae, pyriform sinuses with honey-thick liquids which is cleared with dry swallow. Recommend dys 2 diet with honey-thick liquids medications whole in puree for now; anticipate upgrade of liquids with additional time post-extubation, upgrade of solids when pt's dentures are available. SLP will f/u for tolerance, repeat instrumental as appropriate. SLP Visit Diagnosis  Dysphagia, oropharyngeal phase (R13.12) Attention and concentration deficit following -- Frontal lobe and executive function deficit following -- Impact on safety and function Moderate aspiration risk   CHL IP TREATMENT RECOMMENDATION 01/10/2017 Treatment Recommendations Therapy as outlined in treatment plan below;F/U MBS in --- days (Comment)   Prognosis 01/10/2017 Prognosis for Safe Diet Advancement Good Barriers to Reach Goals Cognitive deficits Barriers/Prognosis Comment -- CHL IP DIET RECOMMENDATION 01/10/2017 SLP Diet Recommendations Dysphagia 2 (Fine chop) solids;Honey thick liquids Liquid Administration via Cup;Straw Medication Administration Whole meds with puree Compensations Slow rate;Small sips/bites;Other (Comment) Postural Changes Seated upright at 90 degrees   CHL IP OTHER RECOMMENDATIONS 01/10/2017 Recommended Consults -- Oral Care Recommendations Oral care BID Other Recommendations Prohibited food (jello, ice cream, thin soups);Order thickener from pharmacy;Remove water pitcher   CHL IP FOLLOW UP RECOMMENDATIONS 01/10/2017 Follow up Recommendations Other (comment)   CHL IP FREQUENCY AND DURATION 01/10/2017 Speech Therapy Frequency (ACUTE ONLY) min 2x/week Treatment Duration 2 weeks  CHL IP ORAL PHASE 01/10/2017 Oral Phase Impaired Oral - Pudding Teaspoon -- Oral - Pudding Cup -- Oral - Honey Teaspoon -- Oral - Honey Cup -- Oral - Nectar Teaspoon -- Oral - Nectar Cup -- Oral - Nectar Straw -- Oral - Thin Teaspoon -- Oral - Thin Cup -- Oral - Thin Straw -- Oral - Puree -- Oral - Mech Soft -- Oral - Regular Impaired mastication Oral - Multi-Consistency -- Oral - Pill -- Oral Phase - Comment --  CHL IP PHARYNGEAL PHASE 01/10/2017 Pharyngeal Phase Impaired Pharyngeal- Pudding Teaspoon -- Pharyngeal -- Pharyngeal- Pudding Cup -- Pharyngeal -- Pharyngeal- Honey Teaspoon -- Pharyngeal -- Pharyngeal- Honey Cup Pharyngeal residue - valleculae;Pharyngeal residue - pyriform;Reduced tongue base retraction Pharyngeal  -- Pharyngeal- Nectar Teaspoon -- Pharyngeal -- Pharyngeal- Nectar Cup Reduced tongue base retraction;Penetration/Aspiration before swallow;Delayed swallow initiation-pyriform sinuses Pharyngeal Material enters airway, CONTACTS cords and not ejected out Pharyngeal- Nectar Straw Delayed swallow initiation-pyriform sinuses;Reduced tongue base retraction;Penetration/Aspiration before swallow Pharyngeal Material enters airway, CONTACTS cords and not ejected out Pharyngeal- Thin Teaspoon Delayed swallow initiation-pyriform sinuses;Reduced tongue base retraction;Penetration/Aspiration before swallow Pharyngeal Material enters airway, CONTACTS cords and not ejected out Pharyngeal- Thin Cup Delayed swallow initiation-pyriform sinuses;Reduced tongue base retraction;Penetration/Aspiration before swallow Pharyngeal Material enters airway, CONTACTS cords and not ejected out Pharyngeal- Thin Straw Delayed swallow initiation-pyriform sinuses;Reduced tongue base retraction;Penetration/Aspiration before swallow Pharyngeal Material enters airway, CONTACTS cords and not ejected out Pharyngeal- Puree WFL Pharyngeal -- Pharyngeal- Mechanical Soft -- Pharyngeal -- Pharyngeal- Regular WFL Pharyngeal -- Pharyngeal- Multi-consistency -- Pharyngeal -- Pharyngeal- Pill WFL Pharyngeal -- Pharyngeal Comment --  CHL IP CERVICAL ESOPHAGEAL PHASE 01/10/2017 Cervical Esophageal Phase WFL Pudding Teaspoon -- Pudding Cup -- Honey Teaspoon -- Honey Cup -- Nectar Teaspoon -- Nectar Cup -- Nectar Straw -- Thin Teaspoon -- Thin Cup -- Thin Straw -- Puree -- Mechanical Soft -- Regular -- Multi-consistency -- Pill -- Cervical Esophageal Comment -- No flowsheet data found. Deneise Lever, [place], CCC-SLP Speech-Language Pathologist [phone] Aliene Altes 01/10/2017, 1:12 PM               Time Spent in minutes  25   Louellen Molder M.D on 01/11/2017 at 11:26 AM  Between 7am to 7pm - Pager - [phone]  After 7pm go to [email or web address] - password  [facility]  [facility] -  Office  [phone]

## 2017-01-11 NOTE — Evaluation (Signed)
Occupational Therapy Evaluation Patient Details Name: Ashley Velez MRN: [id number] DOB: [birth year] Today's Date: 01/11/2017    History of Present Illness Pt is a 82 yo female admitted through ED on 01/06/17 from SNF due to seizure like activity. Pt became unresponsive and was intubated in ED. Pt was intubated x 4 days and extubated on 01/09/17. Seizure was questioned to be related to hyperglycemia, stroke and epilepsy were ruled out of diagnoses. PMH significant for DM2, HTN, stroke, HLD.     Clinical Impression   Pt admitted with above. She demonstrates the below listed deficits and will benefit from continued OT to maximize safety and independence with BADLs.  Pt presents to OT with generalized weakness, impaired balance, decreased activity tolerance, memory impairment.  She requires min A for ADLs.  She reports she was at SNF from rehab [facility], and prior to that lived with daughter and was mod I with ADLs.  Recommend Return to SNF      Follow Up Recommendations  SNF    Equipment Recommendations  None recommended by OT    Recommendations for Other Services       Precautions / Restrictions Precautions Precautions: Fall Restrictions Weight Bearing Restrictions: No      Mobility Bed Mobility Overal bed mobility: Needs Assistance Bed Mobility: Supine to Sit     Supine to sit: Min assist     General bed mobility comments: up in chair   Transfers Overall transfer level: Needs assistance Equipment used: 1 person hand held assist Transfers: Sit to/from Stand;Stand Pivot Transfers Sit to Stand: Min assist Stand pivot transfers: Min assist       General transfer comment: min a to steady     Balance Overall balance assessment: Needs assistance Sitting-balance support: Feet supported Sitting balance-Leahy Scale: Good Sitting balance - Comments: able to don socks without LOB while EOC    Standing balance support: No upper extremity supported Standing balance-Leahy  Scale: Fair Standing balance comment: reliant on Rw for stability                           ADL either performed or assessed with clinical judgement   ADL Overall ADL's : Needs assistance/impaired Eating/Feeding: Set up;Sitting   Grooming: Wash/dry hands;Wash/dry face;Oral care;Brushing hair;Min guard;Standing   Upper Body Bathing: Set up;Supervision/ safety;Sitting   Lower Body Bathing: Minimal assistance;Sit to/from stand Lower Body Bathing Details (indicate cue type and reason): assist for peri area  Upper Body Dressing : Set up;Supervision/safety;Sitting   Lower Body Dressing: Minimal assistance;Sit to/from stand Lower Body Dressing Details (indicate cue type and reason): assist with pulling pants over hips  Toilet Transfer: Ambulation;Comfort height toilet;Grab bars;Minimal assistance   Toileting- Clothing Manipulation and Hygiene: Minimal assistance;Sit to/from stand Toileting - Clothing Manipulation Details (indicate cue type and reason): assist for peri care      Functional mobility during ADLs: Min guard General ADL Comments: DOE 3/4 with ADLs      Vision Baseline Vision/History: No visual deficits Patient Visual Report: No change from baseline       Perception     Praxis      Pertinent Vitals/Pain Pain Assessment: No/denies pain Pain Intervention(s): Monitored during session;Repositioned     Hand Dominance Right   Extremity/Trunk Assessment Upper Extremity Assessment Upper Extremity Assessment: Generalized weakness   Lower Extremity Assessment Lower Extremity Assessment: Defer to PT evaluation   Cervical / Trunk Assessment Cervical / Trunk Assessment: Normal   Communication Communication Communication:  Expressive difficulties (low volume )   Cognition Arousal/Alertness: Awake/alert Behavior During Therapy: WFL for tasks assessed/performed Overall Cognitive Status: No family/caregiver present to determine baseline cognitive functioning                                  General Comments: Pt with memory deficits    General Comments       Exercises     Shoulder Instructions      Home Living Family/patient expects to be discharged to:: Skilled nursing facility                                        Prior Functioning/Environment Level of Independence: Independent with assistive device(s);Needs assistance  Gait / Transfers Assistance Needed: Pt reports she has been needing assistance with ambulation while at SNF.  Prior to that she reports mod I ADL's / Homemaking Assistance Needed: Pt reports she has needed assist since at SNF.  Prior to that was mod I             OT Problem List: Decreased strength;Decreased activity tolerance;Impaired balance (sitting and/or standing);Decreased knowledge of use of DME or AE;Decreased cognition      OT Treatment/Interventions: Self-care/ADL training;DME and/or AE instruction;Therapeutic activities;Patient/family education;Balance training;Therapeutic exercise    OT Goals(Current goals can be found in the care plan section) Acute Rehab OT Goals Patient Stated Goal: to go back to rehab  OT Goal Formulation: With patient Time For Goal Achievement: 01/25/17 Potential to Achieve Goals: Good ADL Goals Pt Will Perform Grooming: with supervision;standing Pt Will Perform Upper Body Bathing: with supervision;standing Pt Will Perform Lower Body Bathing: with supervision;sit to/from stand Pt Will Perform Upper Body Dressing: with supervision;standing Pt Will Perform Lower Body Dressing: with supervision;sit to/from stand Pt Will Transfer to Toilet: with supervision;ambulating;regular height toilet;grab bars Pt Will Perform Toileting - Clothing Manipulation and hygiene: with supervision;sit to/from stand  OT Frequency: Min 2X/week   Barriers to D/C: Decreased caregiver support          Co-evaluation              AM-PAC PT "6 Clicks" Daily  Activity     Outcome Measure Help from another person eating meals?: A Little Help from another person taking care of personal grooming?: A Little Help from another person toileting, which includes using toliet, bedpan, or urinal?: A Little Help from another person bathing (including washing, rinsing, drying)?: A Little Help from another person to put on and taking off regular upper body clothing?: A Little Help from another person to put on and taking off regular lower body clothing?: A Little 6 Click Score: 18   End of Session Nurse Communication: Mobility status  Activity Tolerance: Patient tolerated treatment well Patient left: in chair;with call bell/phone within reach;with chair alarm set  OT Visit Diagnosis: Unsteadiness on feet (R26.81)                Time: 9833-8250 OT Time Calculation (min): 19 min Charges:  OT General Charges $OT Visit: 1 Procedure OT Evaluation $OT Eval Moderate Complexity: 1 Procedure G-Codes:     Lucille Passy, OTR/L [id number]   Lucille Passy M 01/11/2017, 6:49 PM

## 2017-01-11 NOTE — Progress Notes (Signed)
Physical Therapy Treatment Patient Details Name: Ashley Velez MRN: [id number] DOB: 1934/12/17 Today's Date: 01/11/2017    History of Present Illness Pt is a 82 yo female admitted through ED on 01/06/17 from SNF due to seizure like activity. Pt became unresponsive and was intubated in ED. Pt was intubated x 4 days and extubated on 01/09/17. Seizure was questioned to be related to hyperglycemia, stroke and epilepsy were ruled out of diagnoses. PMH significant for DM2, HTN, stroke, HLD.      PT Comments    Pt is making good progress towards her goals today. Pt currently minA for bed mobility, transfers to RW and ambulation of 80 feet with RW. Pt reports no longer feeling dizzy with walk as she had in the past. Pt requires skilled PT to progress gait training and to improve LE strength and endurance to safely mobilize in her discharge environment.    Follow Up Recommendations  SNF     Equipment Recommendations  None recommended by PT    Recommendations for Other Services OT consult     Precautions / Restrictions Precautions Precautions: Fall Restrictions Weight Bearing Restrictions: No    Mobility  Bed Mobility Overal bed mobility: Needs Assistance Bed Mobility: Supine to Sit     Supine to sit: Min assist     General bed mobility comments: minA for managing LE to floor and pad scoot of hips to EoB  Transfers Overall transfer level: Needs assistance Equipment used: Rolling walker (2 wheeled) Transfers: Sit to/from Stand Sit to Stand: Min assist         General transfer comment: minA for steadying at [employer]  Ambulation/Gait Ambulation/Gait assistance: Min assist;+2 safety/equipment (close chair follow ) Ambulation Distance (Feet): 80 Feet Assistive device: Rolling walker (2 wheeled) Gait Pattern/deviations: Step-through pattern;Decreased step length - right;Decreased step length - left;Narrow base of support Gait velocity: decreased Gait velocity interpretation:  Below normal speed for age/gender General Gait Details: step through sequencing. MIn A +2 for close chair follow because pt kept commenting on how she was surprised that she was not feeling dizzy.           Balance Overall balance assessment: Needs assistance Sitting-balance support: No upper extremity supported;Feet supported Sitting balance-Leahy Scale: Fair     Standing balance support: Bilateral upper extremity supported Standing balance-Leahy Scale: Fair Standing balance comment: reliant on Rw for stability                            Cognition Arousal/Alertness: Awake/alert Behavior During Therapy: WFL for tasks assessed/performed Overall Cognitive Status: Within Functional Limits for tasks assessed                                               Pertinent Vitals/Pain Pain Assessment: No/denies pain Pain Intervention(s): Monitored during session;Repositioned  VSS             PT Goals (current goals can now be found in the care plan section) Acute Rehab PT Goals Patient Stated Goal: to go to rehab and then return home PT Goal Formulation: With patient Time For Goal Achievement: 01/17/17 Potential to Achieve Goals: Good Progress towards PT goals: Progressing toward goals    Frequency    Min 2X/week      PT Plan Current plan remains appropriate  AM-PAC PT "6 Clicks" Daily Activity  Outcome Measure  Difficulty turning over in bed (including adjusting bedclothes, sheets and blankets)?: A Lot Difficulty moving from lying on back to sitting on the side of the bed? : Total Difficulty sitting down on and standing up from a chair with arms (e.g., wheelchair, bedside commode, etc,.)?: Total Help needed moving to and from a bed to chair (including a wheelchair)?: A Little Help needed walking in hospital room?: A Little Help needed climbing 3-5 steps with a railing? : A Lot 6 Click Score: 12    End of Session Equipment  Utilized During Treatment: Gait belt Activity Tolerance: Patient tolerated treatment well Patient left: in chair;with call bell/phone within reach;with chair alarm set Nurse Communication: Mobility status;Other (comment) (called nurse to notify that pt IV began to leak with walk ) PT Visit Diagnosis: Difficulty in walking, not elsewhere classified (R26.2);Muscle weakness (generalized) (M62.81)     Time: 0923-3007 PT Time Calculation (min) (ACUTE ONLY): 15 min  Charges:  $Gait Training: 8-22 mins                    G Codes:       Tadarrius Burch B. Migdalia Dk PT, DPT Acute Rehabilitation  [phone] Pager [phone]     [place] 01/11/2017, 3:31 PM

## 2017-01-12 DIAGNOSIS — I10 Essential (primary) hypertension: Secondary | ICD-10-CM | POA: Diagnosis present

## 2017-01-12 DIAGNOSIS — N179 Acute kidney failure, unspecified: Secondary | ICD-10-CM | POA: Diagnosis present

## 2017-01-12 DIAGNOSIS — J9601 Acute respiratory failure with hypoxia: Secondary | ICD-10-CM

## 2017-01-12 DIAGNOSIS — E162 Hypoglycemia, unspecified: Secondary | ICD-10-CM | POA: Diagnosis present

## 2017-01-12 DIAGNOSIS — J690 Pneumonitis due to inhalation of food and vomit: Secondary | ICD-10-CM | POA: Diagnosis not present

## 2017-01-12 DIAGNOSIS — G40901 Epilepsy, unspecified, not intractable, with status epilepticus: Secondary | ICD-10-CM | POA: Diagnosis present

## 2017-01-12 LAB — GLUCOSE, CAPILLARY
GLUCOSE-CAPILLARY: 147 mg/dL — AB (ref 65–99)
Glucose-Capillary: 142 mg/dL — ABNORMAL HIGH (ref 65–99)
Glucose-Capillary: 138 mg/dL — ABNORMAL HIGH (ref 65–99)
Glucose-Capillary: 176 mg/dL — ABNORMAL HIGH (ref 65–99)

## 2017-01-12 LAB — RENAL FUNCTION PANEL
ALBUMIN: 2.2 g/dL — AB (ref 3.5–5.0)
Anion gap: 8 (ref 5–15)
BUN: 20 mg/dL (ref 6–20)
CALCIUM: 7.2 mg/dL — AB (ref 8.9–10.3)
CO2: 16 mmol/L — AB (ref 22–32)
CREATININE: 1.57 mg/dL — AB (ref 0.44–1.00)
Chloride: 117 mmol/L — ABNORMAL HIGH (ref 101–111)
GFR calc Af Amer: 35 mL/min — ABNORMAL LOW (ref >60)
GFR calc non Af Amer: 30 mL/min — ABNORMAL LOW (ref >60)
GLUCOSE: 137 mg/dL — AB (ref 65–99)
PHOSPHORUS: 2.4 mg/dL — AB (ref 2.5–4.6)
Potassium: 4.1 mmol/L (ref 3.5–5.1)
SODIUM: 141 mmol/L (ref 135–145)

## 2017-01-12 LAB — CULTURE, BLOOD (ROUTINE X 2)
CULTURE: NO GROWTH
Culture: NO GROWTH
SPECIAL REQUESTS: ADEQUATE
Special Requests: ADEQUATE

## 2017-01-12 LAB — CBC WITH DIFFERENTIAL/PLATELET
BASOS PCT: 0 %
Basophils Absolute: 0 10*3/uL (ref 0.0–0.1)
EOS ABS: 0.6 10*3/uL (ref 0.0–0.7)
EOS PCT: 6 %
HCT: 30.8 % — ABNORMAL LOW (ref 36.0–46.0)
Hemoglobin: 9.8 g/dL — ABNORMAL LOW (ref 12.0–15.0)
LYMPHS ABS: 3.1 10*3/uL (ref 0.7–4.0)
Lymphocytes Relative: 28 %
MCH: 28.1 pg (ref 26.0–34.0)
MCHC: 31.8 g/dL (ref 30.0–36.0)
MCV: 88.3 fL (ref 78.0–100.0)
MONO ABS: 1 10*3/uL (ref 0.1–1.0)
MONOS PCT: 9 %
Neutro Abs: 6.1 10*3/uL (ref 1.7–7.7)
Neutrophils Relative %: 57 %
PLATELETS: 302 10*3/uL (ref 150–400)
RBC: 3.49 MIL/uL — ABNORMAL LOW (ref 3.87–5.11)
RDW: 16.1 % — AB (ref 11.5–15.5)
WBC: 10.9 10*3/uL — ABNORMAL HIGH (ref 4.0–10.5)

## 2017-01-12 LAB — MAGNESIUM: Magnesium: 1.9 mg/dL (ref 1.7–2.4)

## 2017-01-12 LAB — HEMOGLOBIN A1C
Hgb A1c MFr Bld: 6.4 % — ABNORMAL HIGH (ref 4.8–5.6)
MEAN PLASMA GLUCOSE: 137 mg/dL

## 2017-01-12 MED ORDER — FELODIPINE ER 10 MG PO TB24: 10.0000 mg | ORAL_TABLET | Freq: Every day | ORAL | 0 refills | Status: AC

## 2017-01-12 MED ORDER — SODIUM BICARBONATE 650 MG PO TABS: 650.0000 mg | ORAL_TABLET | Freq: Two times a day (BID) | ORAL | 0 refills | Status: AC

## 2017-01-12 MED ORDER — AMOXICILLIN-POT CLAVULANATE 500-125 MG PO TABS: 1.0000 | ORAL_TABLET | Freq: Two times a day (BID) | ORAL | 0 refills | Status: AC

## 2017-01-12 MED ORDER — RESOURCE THICKENUP CLEAR PO POWD: 1.0000 | ORAL | 0 refills | Status: AC | PRN

## 2017-01-12 NOTE — Progress Notes (Signed)
Patient will discharge to [facility] Anticipated discharge date: 6/19 Family notified: pt dtr Transportation by [facility]- called at 12:20- per [facility] they are 2-3 hours behind  CSW signing off.  Jorge Ny, LCSW Clinical Social Worker [phone]

## 2017-01-12 NOTE — Progress Notes (Signed)
Patient discharge teaching given, including activity, diet, follow-up appoints, and medications. Patient verbalized understanding of all discharge instructions. IV access was d/c'd. Vitals are stable. Skin is intact except as charted in most recent assessments. Pt escorted out by [facility] and transferred to [facility].

## 2017-01-12 NOTE — Discharge Summary (Signed)
Physician Discharge Summary  Ashley Velez IRJ:[id number] DOB: Nov 14, 1934 DOA: 01/06/2017  PCP: Crist Infante, MD  Admit date: 01/06/2017 Discharge date: 01/12/2017  Admitted From: SNF Disposition:  SNF  Recommendations for Outpatient Follow-up:  1. Follow up with MD at SNF in 1 week. I have Taken patient of her glipizide and metformin. Continue to monitor on sliding scale coverage. If blood glucose continues to be high she can be put back on metformin. 2. Please check BMET in next 2-3 days. If renal function has improved she can be placed back on valsartan. 3. Patient will complete 7 days of antibiotic on 6/21.  Home Health:None Equipment/Devices: per therapy at the facility  Discharge Condition: Fair CODE STATUS: Full code Diet recommendation: Dysphagia level II   Discharge Diagnoses:  Principal problem    Acute respiratory failure with hypoxia ([place])   Status epilepticus ([place])  Active Problems:   Seizure ([place])   Hypoglycemia   AKI (acute kidney injury) ([place])   Aspiration pneumonia ([place])   Uncontrolled hypertension     brief narrative/history of present illness 82 year old female with history of diabetes mellitus on metformin admitted to ICU with seizures likely in the setting of hypoglycemia. In the nursing home her blood glucose was 340 and given 10 units of insulin, glucose subsequently dropped to the 60s and patient had seizures. She came unresponsive and was intubated for airway protection. Her blood pressure was in the 200s. Patient admitted to ICU. Patient also developed fever the next day with concerns for aspiration pneumonia and started on empiric antibiotics. Head CT and MRI brain without acute findings. EEG negative for acute seizures. Patient transferred to hospitalist service on 6/18.  Principal problem Acute respiratory failure with hypoxemia Secondary to acute seizures with metabolic encephalopathy. Was intubated in ICU for any protection. Also developed  aspiration pneumonia. Cultures negative. Patient extubated and transferred to telemetry and remains stable on room air. Antibiotic narrowed to oral Augmentin.   Acute hypoglycemic seizures EEG negative for epileptiform activity.. Neurology consult appreciated. Was placed on Keppra initially, now discontinued. MRI brain negative for acute findings or  PRES  and showed old right basal ganglia and right cerebellar infarct. Metformin and glipizide discontinued.  A1c of 6.4. Monitor with sliding scale coverage at the facility. If CABG persistently elevated may restart metformin.   Aspiration pneumonia/pneumonitis   on empiric Zosyn. Cultures negative. Afebrile. Transition to oral Augmentin to complete a seven-day course of antibiotic.  Acute kidney injury Possibly due to dehydration. Renal function slowly improving with IV fluids. Holding valsartan. Recheck renal function in next 2-3 days and is stable can be resumed.  Acute metabolic encephalopathy  secondary to seizure. Now resolved.  Hypertensive emergency On admission. Blood pressure elevated. Continue Coreg. Felodipine  dose increased. Resume valsartan once renal function better.  History of cerebellar stroke Continue Plavix and statin.    Family Communication  : None at bedside  Disposition Plan  : SNF   Consults  :   PC CM Neurology  Procedures  : CT head/MRI brain EEG   Discharge Instructions   Allergies as of 01/12/2017      Reactions   Fosamax [alendronate]    Noted to be an allergy on the patient's paperwork from facility, but no reaction is recorded   Macrodantin [nitrofurantoin]    Noted to be an allergy on the patient's paperwork from facility, but no reaction is recorded      Medication List    STOP taking these medications   FLONASE  ALLERGY RELIEF 50 MCG/ACT nasal spray Generic drug:  fluticasone   glipiZIDE-metformin 2.5-500 MG tablet Commonly known as:  METAGLIP   metFORMIN 500 MG  tablet Commonly known as:  GLUCOPHAGE   valsartan 320 MG tablet Commonly known as:  DIOVAN     TAKE these medications   amoxicillin-clavulanate 500-125 MG tablet Commonly known as:  AUGMENTIN Take 1 tablet (500 mg total) by mouth 2 (two) times daily.   CALCIUM 500 + D3 PO Take 1 tablet by mouth daily.   carvedilol 12.5 MG tablet Commonly known as:  COREG Take 12.5 mg by mouth 2 (two) times daily.   clopidogrel 75 MG tablet Commonly known as:  PLAVIX Take 75 mg by mouth daily.   cyanocobalamin 1000 MCG/ML injection Commonly known as:  (VITAMIN B-12) Inject 1,000 mcg into the muscle every 30 (thirty) days. ON THE 30TH OF THE MONTH   felodipine 10 MG 24 hr tablet Commonly known as:  PLENDIL Take 1 tablet (10 mg total) by mouth daily. What changed:  medication strength  how much to take   HUMALOG 100 UNIT/ML injection Generic drug:  insulin lispro Inject into the skin 2 (two) times daily. "PER HOUSE SLIDING SCALE PROTOCOL"   Iron 28 MG Tabs Take 28 mg by mouth daily.   LEVOXYL 50 MCG tablet Generic drug:  levothyroxine Take 50 mcg by mouth daily before breakfast.   loratadine 10 MG tablet Commonly known as:  CLARITIN Take 10 mg by mouth daily.   OLANZapine 15 MG tablet Commonly known as:  ZYPREXA Take 15 mg by mouth at bedtime.   ONE TOUCH ULTRA TEST test strip Generic drug:  glucose blood   ONETOUCH DELICA LANCETS FINE Misc 2 (two) times daily. for testing   RESOURCE THICKENUP CLEAR Powd Take 120 g by mouth as needed (with all liquids).   simvastatin 20 MG tablet Commonly known as:  ZOCOR Take 20 mg by mouth at bedtime.   sodium bicarbonate 650 MG tablet Take 1 tablet (650 mg total) by mouth 2 (two) times daily.   Vitamin D (Ergocalciferol) 50000 units Caps capsule Commonly known as:  DRISDOL Take 50,000 Units by mouth once a week. [facility]      Follow-up Information    MD at SNF Follow up in 1 week(s).          Allergies  Allergen  Reactions  . Fosamax [Alendronate]     Noted to be an allergy on the patient's paperwork from facility, but no reaction is recorded  . Macrodantin [Nitrofurantoin]     Noted to be an allergy on the patient's paperwork from facility, but no reaction is recorded      Procedures/Studies: Ct Head Wo Contrast  Result Date: 01/06/2017 CLINICAL DATA:  82 year old female with seizure like activity and altered mental status. EXAM: CT HEAD WITHOUT CONTRAST TECHNIQUE: Contiguous axial images were obtained from the base of the skull through the vertex without intravenous contrast. COMPARISON:  03/11/2015 head CT FINDINGS: Brain: No evidence of acute infarction, hemorrhage, hydrocephalus, extra-axial collection or mass lesion/mass effect. Atrophy, chronic small-vessel white matter ischemic changes and remote right basal ganglia and cerebellar infarcts again noted. Vascular: Intracranial atherosclerotic calcifications noted. Skull: Normal. Negative for fracture or focal lesion. Sinuses/Orbits: No acute finding. Other: None. IMPRESSION: No evidence of acute intracranial abnormality. Atrophy, chronic small-vessel white matter ischemic changes and remote right basal ganglia and right cerebellar infarcts. Electronically Signed   By: Margarette Canada M.D.   On: 01/06/2017 21:07  Mr Brain Wo Contrast  Result Date: 01/08/2017 CLINICAL DATA:  Seizure type activity at care facility, hyperglycemia. History of hypertension and diabetes. EXAM: MRI HEAD WITHOUT CONTRAST TECHNIQUE: Multiplanar, multiecho pulse sequences of the brain and surrounding structures were obtained without intravenous contrast. COMPARISON:  CT HEAD January 06, 2017 and MRI of the head May 22, 2013 FINDINGS: BRAIN: No reduced diffusion to suggest acute ischemia. Old RIGHT basal ganglia/corona radiata infarct with mineralization, no susceptibility artifact to suggest hemorrhage. Old small RIGHT cerebellar infarcts. Prominent basal ganglia perivascular spaces  associated with chronic small vessel ischemic disease. Patchy supratentorial and pontine white matter FLAIR T2 hyperintensities, relatively unchanged. No midline shift, mass effect or masses. Wallerian degeneration evident RIGHT cerebral peduncle. No abnormal extra-axial fluid collections. VASCULAR: Normal major intracranial vascular flow voids present at skull base. SKULL AND UPPER CERVICAL SPINE: No abnormal sellar expansion. No suspicious calvarial bone marrow signal. Craniocervical junction maintained. SINUSES/ORBITS: Trace paranasal sinus mucosal thickening . bilateral mastoid effusions. The included ocular globes and orbital contents are non-suspicious. OTHER: Patient is edentulous.  Life-support lines in place. IMPRESSION: No acute intracranial process. Old RIGHT basal basal ganglia and RIGHT cerebellar infarcts. Moderate to severe chronic small vessel ischemic disease. Electronically Signed   By: Elon Alas M.D.   On: 01/08/2017 01:51   US Renal  Result Date: 01/09/2017 CLINICAL DATA:  Acute renal insufficiency. EXAM: RENAL / URINARY TRACT ULTRASOUND COMPLETE COMPARISON:  None. FINDINGS: Right Kidney: Length: 9.6 cm. Cortical thinning with several cysts and increased cortical echogenicity. The largest cyst measures 1.6 cm. Left Kidney: Length: 12 cm. Cortical thinning with increased cortical echogenicity and at least 1 mildly complicated 3 cm cysts. Bladder: Appears normal for degree of bladder distention. IMPRESSION: 1. Renal cysts. Medical renal disease. No hydronephrosis identified. 2. Small right pleural effusion incidentally identified. Electronically Signed   By: Dorise Bullion III M.D   On: 01/09/2017 19:30   Dg Chest Port 1 View  Result Date: 01/07/2017 CLINICAL DATA:  Respiratory failure, history of CVA, hypertension, diabetes. EXAM: PORTABLE CHEST 1 VIEW COMPARISON:  Chest x-ray of January 06, 2017 FINDINGS: The lungs are well-expanded and clear. The heart and pulmonary vascularity are  normal. There is calcification in the wall of the aortic arch. There is no pleural effusion or pneumothorax. The observed portions of the bony thorax exhibit no acute abnormalities. The endotracheal tube tip lies 3.8 cm above the carina. The esophagogastric tube tip and proximal port lie in the gastric cardia. IMPRESSION: Intubated patient.  No acute cardiopulmonary abnormality. Thoracic aortic atherosclerosis. Electronically Signed   By: David  Martinique M.D.   On: 01/07/2017 07:44   Dg Chest Portable 1 View  Result Date: 01/06/2017 CLINICAL DATA:  Post invasion, status post seizure EXAM: PORTABLE CHEST 1 VIEW COMPARISON:  CT chest dated 10/30/2016 FINDINGS: Lungs are clear.  No pleural effusion or pneumothorax. Endotracheal tube terminates 5.5 cm above the carina. Heart is normal in size. Enteric tube terminates in the gastric cardia. IMPRESSION: Endotracheal tube terminates 5.5 cm above the carina. Enteric tube terminates in the gastric cardia. Electronically Signed   By: Julian Hy M.D.   On: 01/06/2017 20:03   Dg Swallowing Func-speech Pathology  Result Date: 01/10/2017 Objective Swallowing Evaluation: Type of Study: MBS-Modified Barium Swallow Study Patient Details Name: Ashley Velez MRN: [id number] Date of Birth: 1934/09/22 Today's Date: 01/10/2017 Time: SLP Start Time (ACUTE ONLY): 1145-SLP Stop Time (ACUTE ONLY): 1200 SLP Time Calculation (min) (ACUTE ONLY): 15 min Past Medical History: Past Medical  History: Diagnosis Date . Diabetes mellitus without complication ([place])  . Hypertension  . Stroke [facility])  Past Surgical History: No past surgical history on file. HPI: 82 y/o female admitted on 6/13 from a nursing home with seizure activity in setting of hypoglycemia. Remained encephalopathic and hypertensive requiring intubation and ICU admission. Intubated 6/13-6/16. 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  Subjective: upright in chair, pleasantly confused Assessment / Plan / Recommendation CHL IP CLINICAL IMPRESSIONS 01/10/2017 Clinical Impression Patient presents with acute, reversible dysphagia s/p 4 day intubation. Mild oral deficits secondary to unavailability of dentures; moderate pharyngeal phase deficits due to sensory impairment, mildly reduced tongue base retraction. Swallow initiation delayed to the level of the pyriform sinuses with thin and nectar-thick liquids, resulting in consistent, deep laryngeal penetration which was not cleared from the laryngeal vestibule despite cues for throat clear, cough. No frank aspiration observed, however aspiration of penetrate diffused in secretions likely. For honey-thick liquids and solids, pt with improved bolus sensation and timely swallow initiation. Mild residue noted in the valleculae, pyriform sinuses with honey-thick liquids which is cleared with dry swallow. Recommend dys 2 diet with honey-thick liquids medications whole in puree for now; anticipate upgrade of liquids with additional time post-extubation, upgrade of solids when pt's dentures are available. SLP will f/u for tolerance, repeat instrumental as appropriate. SLP Visit Diagnosis Dysphagia, oropharyngeal phase (R13.12) Attention and concentration deficit following -- Frontal lobe and executive function deficit following -- Impact on safety and function Moderate aspiration risk   CHL IP TREATMENT RECOMMENDATION 01/10/2017 Treatment Recommendations Therapy as outlined in treatment plan below;F/U MBS in --- days (Comment)   Prognosis 01/10/2017 Prognosis for Safe Diet Advancement Good Barriers to Reach Goals Cognitive deficits Barriers/Prognosis Comment -- CHL IP DIET RECOMMENDATION 01/10/2017 SLP Diet Recommendations Dysphagia 2 (Fine chop) solids;Honey thick liquids Liquid Administration via Cup;Straw Medication Administration Whole meds with puree Compensations Slow rate;Small sips/bites;Other (Comment)  Postural Changes Seated upright at 90 degrees   CHL IP OTHER RECOMMENDATIONS 01/10/2017 Recommended Consults -- Oral Care Recommendations Oral care BID Other Recommendations Prohibited food (jello, ice cream, thin soups);Order thickener from pharmacy;Remove water pitcher   CHL IP FOLLOW UP RECOMMENDATIONS 01/10/2017 Follow up Recommendations Other (comment)   CHL IP FREQUENCY AND DURATION 01/10/2017 Speech Therapy Frequency (ACUTE ONLY) min 2x/week Treatment Duration 2 weeks      CHL IP ORAL PHASE 01/10/2017 Oral Phase Impaired Oral - Pudding Teaspoon -- Oral - Pudding Cup -- Oral - Honey Teaspoon -- Oral - Honey Cup -- Oral - Nectar Teaspoon -- Oral - Nectar Cup -- Oral - Nectar Straw -- Oral - Thin Teaspoon -- Oral - Thin Cup -- Oral - Thin Straw -- Oral - Puree -- Oral - Mech Soft -- Oral - Regular Impaired mastication Oral - Multi-Consistency -- Oral - Pill -- Oral Phase - Comment --  CHL IP PHARYNGEAL PHASE 01/10/2017 Pharyngeal Phase Impaired Pharyngeal- Pudding Teaspoon -- Pharyngeal -- Pharyngeal- Pudding Cup -- Pharyngeal -- Pharyngeal- Honey Teaspoon -- Pharyngeal -- Pharyngeal- Honey Cup Pharyngeal residue - valleculae;Pharyngeal residue - pyriform;Reduced tongue base retraction Pharyngeal -- Pharyngeal- Nectar Teaspoon -- Pharyngeal -- Pharyngeal- Nectar Cup Reduced tongue base retraction;Penetration/Aspiration before swallow;Delayed swallow initiation-pyriform sinuses Pharyngeal Material enters airway, CONTACTS cords and not ejected out Pharyngeal- Nectar Straw Delayed swallow initiation-pyriform sinuses;Reduced tongue base retraction;Penetration/Aspiration before swallow Pharyngeal Material enters airway, CONTACTS cords and not ejected out Pharyngeal- Thin Teaspoon Delayed swallow initiation-pyriform sinuses;Reduced tongue base  retraction;Penetration/Aspiration before swallow Pharyngeal Material enters airway, CONTACTS cords and not ejected out Pharyngeal- Thin Cup Delayed swallow initiation-pyriform  sinuses;Reduced tongue base retraction;Penetration/Aspiration before swallow Pharyngeal Material enters airway, CONTACTS cords and not ejected out Pharyngeal- Thin Straw Delayed swallow initiation-pyriform sinuses;Reduced tongue base retraction;Penetration/Aspiration before swallow Pharyngeal Material enters airway, CONTACTS cords and not ejected out Pharyngeal- Puree WFL Pharyngeal -- Pharyngeal- Mechanical Soft -- Pharyngeal -- Pharyngeal- Regular WFL Pharyngeal -- Pharyngeal- Multi-consistency -- Pharyngeal -- Pharyngeal- Pill WFL Pharyngeal -- Pharyngeal Comment --  CHL IP CERVICAL ESOPHAGEAL PHASE 01/10/2017 Cervical Esophageal Phase WFL Pudding Teaspoon -- Pudding Cup -- Honey Teaspoon -- Honey Cup -- Nectar Teaspoon -- Nectar Cup -- Nectar Straw -- Thin Teaspoon -- Thin Cup -- Thin Straw -- Puree -- Mechanical Soft -- Regular -- Multi-consistency -- Pill -- Cervical Esophageal Comment -- No flowsheet data found. Deneise Lever, [place], CCC-SLP Speech-Language Pathologist [place] 01/10/2017, 1:12 PM                  Subjective: Denies any headache, dizziness, chest pain or shortness of breath. Stable on telemetry overnight.   Discharge Exam: Vitals:   01/12/17 0603 01/12/17 0716  BP: (!) 182/79 (!) 167/62  Pulse: 75 70  Resp: 18   Temp:     Vitals:   01/11/17 2246 01/12/17 0051 01/12/17 0603 01/12/17 0716  BP: (!) 179/65 (!) 168/76 (!) 182/79 (!) 167/62  Pulse: 79 73 75 70  Resp: 18  18   Temp: 98.7 F (37.1 C)     TempSrc: Oral     SpO2: 100%  100%   Weight:      Height:        Gen: Elderly female not in distress HEENT: moist mucosa, supple neck Chest: clear b/l, no added sounds CVS: N S1&S2, no murmurs, rubs or gallop GI: soft, NT, ND,  Musculoskeletal: warm, no edema CNS: Alert and oriented, nonfocal    The results of significant diagnostics from this hospitalization (including imaging, microbiology, ancillary and laboratory) are listed below for  reference.     Microbiology: Recent Results (from the past 240 hour(s))  Culture, blood (Routine X 2) w Reflex to ID Panel     Status: None (Preliminary result)   Collection Time: 01/07/17  2:02 AM  Result Value Ref Range Status   Specimen Description BLOOD BLOOD RIGHT FOREARM  Final   Special Requests   Final    BOTTLES DRAWN AEROBIC AND ANAEROBIC Blood Culture adequate volume   Culture NO GROWTH 4 DAYS  Final   Report Status PENDING  Incomplete  MRSA PCR Screening     Status: None   Collection Time: 01/07/17  2:05 AM  Result Value Ref Range Status   MRSA by PCR NEGATIVE NEGATIVE Final    Comment:        The GeneXpert MRSA Assay (FDA approved for NASAL specimens only), is one component of a comprehensive MRSA colonization surveillance program. It is not intended to diagnose MRSA infection nor to guide or monitor treatment for MRSA infections.   Culture, blood (Routine X 2) w Reflex to ID Panel     Status: None (Preliminary result)   Collection Time: 01/07/17  2:45 AM  Result Value Ref Range Status   Specimen Description BLOOD LEFT HAND  Final   Special Requests IN PEDIATRIC BOTTLE Blood Culture adequate volume  Final   Culture NO GROWTH 4 DAYS  Final   Report Status PENDING  Incomplete  Labs: BNP (last 3 results) No results for input(s): BNP in the last 8760 hours. Basic Metabolic Panel:  Recent Labs Lab 01/08/17 0323 01/09/17 0532 01/10/17 0227 01/11/17 0202 01/12/17 0520  NA 136 141 135  137 136  137 141  K 3.7 3.5 4.1  3.8 3.8  3.8 4.1  CL 108 113* 110  110 112*  113* 117*  CO2 19* 15* 13*  15* 17*  17* 16*  GLUCOSE 138* 110* 101*  108* 123*  122* 137*  BUN 22* 23* 22*  21* 26*  27* 20  CREATININE 1.85* 2.07* 1.80*  1.85* 1.67*  1.69* 1.57*  CALCIUM 8.2* 7.3* 6.8*  7.0* 7.0*  7.0* 7.2*  MG 2.3 2.0 2.0 2.1 1.9  PHOS 2.4* 3.5 3.3 2.9 2.4*   Liver Function Tests:  Recent Labs Lab 01/06/17 2000 01/07/17 0230 01/08/17 0323  01/09/17 0532 01/10/17 0227 01/11/17 0202 01/12/17 0520  AST 26 21  --   --   --   --   --   ALT 14 14  --   --   --   --   --   ALKPHOS 48 43  --   --   --   --   --   BILITOT 0.4 0.7  --   --   --   --   --   PROT 7.3 6.1*  --   --   --   --   --   ALBUMIN 4.2 3.5 2.8* 2.4* 2.4* 2.2* 2.2*   No results for input(s): LIPASE, AMYLASE in the last 168 hours. No results for input(s): AMMONIA in the last 168 hours. CBC:  Recent Labs Lab 01/08/17 0323 01/09/17 0532 01/10/17 0227 01/11/17 0202 01/12/17 0520  WBC 14.4* 13.0* 12.8* 10.4 10.9*  NEUTROABS 11.2* 10.2* 8.8* 6.6 6.1  HGB 9.7* 9.1* 9.4* 9.3* 9.8*  HCT 29.2* 28.8* 30.0* 28.4* 30.8*  MCV 85.4 88.1 88.0 85.3 88.3  PLT 259 223 288 297 302   Cardiac Enzymes:  Recent Labs Lab 01/06/17 2000  TROPONINI <0.03   BNP: Invalid input(s): POCBNP CBG:  Recent Labs Lab 01/11/17 2007 01/11/17 2247 01/12/17 0049 01/12/17 0631 01/12/17 0743  GLUCAP 205* 165* 147* 142* 138*   D-Dimer No results for input(s): DDIMER in the last 72 hours. Hgb A1c  Recent Labs  01/11/17 0202  HGBA1C 6.4*   Lipid Profile No results for input(s): CHOL, HDL, LDLCALC, TRIG, CHOLHDL, LDLDIRECT in the last 72 hours. Thyroid function studies No results for input(s): TSH, T4TOTAL, T3FREE, THYROIDAB in the last 72 hours.  Invalid input(s): FREET3 Anemia work up No results for input(s): VITAMINB12, FOLATE, FERRITIN, TIBC, IRON, RETICCTPCT in the last 72 hours. Urinalysis    Component Value Date/Time   COLORURINE STRAW (A) 01/06/2017 2115   APPEARANCEUR CLEAR 01/06/2017 2115   LABSPEC 1.006 01/06/2017 2115   PHURINE 6.0 01/06/2017 2115   GLUCOSEU 50 (A) 01/06/2017 2115   HGBUR NEGATIVE 01/06/2017 2115   BILIRUBINUR NEGATIVE 01/06/2017 2115   KETONESUR NEGATIVE 01/06/2017 2115   PROTEINUR 100 (A) 01/06/2017 2115   NITRITE NEGATIVE 01/06/2017 2115   LEUKOCYTESUR NEGATIVE 01/06/2017 2115   Sepsis Labs Invalid input(s): PROCALCITONIN,   WBC,  LACTICIDVEN Microbiology Recent Results (from the past 240 hour(s))  Culture, blood (Routine X 2) w Reflex to ID Panel     Status: None (Preliminary result)   Collection Time: 01/07/17  2:02 AM  Result Value Ref Range Status   Specimen Description BLOOD BLOOD RIGHT  FOREARM  Final   Special Requests   Final    BOTTLES DRAWN AEROBIC AND ANAEROBIC Blood Culture adequate volume   Culture NO GROWTH 4 DAYS  Final   Report Status PENDING  Incomplete  MRSA PCR Screening     Status: None   Collection Time: 01/07/17  2:05 AM  Result Value Ref Range Status   MRSA by PCR NEGATIVE NEGATIVE Final    Comment:        The GeneXpert MRSA Assay (FDA approved for NASAL specimens only), is one component of a comprehensive MRSA colonization surveillance program. It is not intended to diagnose MRSA infection nor to guide or monitor treatment for MRSA infections.   Culture, blood (Routine X 2) w Reflex to ID Panel     Status: None (Preliminary result)   Collection Time: 01/07/17  2:45 AM  Result Value Ref Range Status   Specimen Description BLOOD LEFT HAND  Final   Special Requests IN PEDIATRIC BOTTLE Blood Culture adequate volume  Final   Culture NO GROWTH 4 DAYS  Final   Report Status PENDING  Incomplete     Time coordinating discharge: Over 30 minutes  SIGNED:   Louellen Molder, MD  [facility] 01/12/2017, 8:38 AM Pager   If 7PM-7AM, please contact night-coverage [email or web address] Password [email or web address]

## 2017-01-12 NOTE — Progress Notes (Signed)
  Speech Language Pathology Treatment: Dysphagia  Patient Details Name: Ashley Velez MRN: [id number] DOB: Sep 05, 1934 Today's Date: 01/12/2017 Time: 1470-9295 SLP Time Calculation (min) (ACUTE ONLY): 20 min  Assessment / Plan / Recommendation Clinical Impression  Pt with suspected resolution of swallow dysfunction.  Observed her consuming copious amounts of thin liquids with good airway protection clinically.  Pt reports dislike of thickener - and at this time, do not recommend to continue.  Advised pt to general precautions and SLP follow up at SNF would be helpful to assure tolerance.   HPI HPI: 82 y/o female admitted on 6/13 from a nursing home with seizure activity in setting of hypoglycemia. Remained encephalopathic and hypertensive requiring intubation and ICU admission. Intubated 6/13-6/16. 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      SLP Plan  Continue with current plan of care       Recommendations  Diet recommendations: Dysphagia 2 (fine chop);Thin liquid Liquids provided via: Cup;Straw Medication Administration: Whole meds with puree Supervision: Patient able to self feed Compensations: Minimize environmental distractions;Slow rate;Small sips/bites Postural Changes and/or Swallow Maneuvers: Seated upright 90 degrees;Upright 30-60 min after meal                Follow up Recommendations: Skilled Nursing facility SLP Visit Diagnosis: Dysphagia, unspecified (R13.10) Plan: Continue with current plan of care       [place], [place] [facility] SLP [phone]

## 2017-01-24 ENCOUNTER — Encounter (HOSPITAL_COMMUNITY): Payer: Self-pay | Admitting: Emergency Medicine

## 2017-01-24 ENCOUNTER — Emergency Department (HOSPITAL_COMMUNITY)
Admission: EM | Admit: 2017-01-24 | Discharge: 2017-01-24 | Disposition: A | Discharge status: Skilled Nursing Facility | Payer: Medicare Other | Attending: Emergency Medicine | Admitting: Emergency Medicine

## 2017-01-24 DIAGNOSIS — Z8673 Personal history of transient ischemic attack (TIA), and cerebral infarction without residual deficits: Secondary | ICD-10-CM | POA: Diagnosis not present

## 2017-01-24 DIAGNOSIS — I10 Essential (primary) hypertension: Secondary | ICD-10-CM | POA: Diagnosis not present

## 2017-01-24 DIAGNOSIS — N179 Acute kidney failure, unspecified: Secondary | ICD-10-CM | POA: Diagnosis not present

## 2017-01-24 DIAGNOSIS — E162 Hypoglycemia, unspecified: Secondary | ICD-10-CM | POA: Insufficient documentation

## 2017-01-24 DIAGNOSIS — Z7984 Long term (current) use of oral hypoglycemic drugs: Secondary | ICD-10-CM | POA: Insufficient documentation

## 2017-01-24 DIAGNOSIS — J9601 Acute respiratory failure with hypoxia: Secondary | ICD-10-CM | POA: Insufficient documentation

## 2017-01-24 DIAGNOSIS — Z79899 Other long term (current) drug therapy: Secondary | ICD-10-CM | POA: Insufficient documentation

## 2017-01-24 DIAGNOSIS — E119 Type 2 diabetes mellitus without complications: Secondary | ICD-10-CM | POA: Diagnosis not present

## 2017-01-24 DIAGNOSIS — Z7902 Long term (current) use of antithrombotics/antiplatelets: Secondary | ICD-10-CM | POA: Diagnosis not present

## 2017-01-24 LAB — CBC WITH DIFFERENTIAL/PLATELET
BASOS PCT: 0 %
Basophils Absolute: 0 10*3/uL (ref 0.0–0.1)
EOS ABS: 0.4 10*3/uL (ref 0.0–0.7)
EOS PCT: 4 %
HEMATOCRIT: 31.7 % — AB (ref 36.0–46.0)
Hemoglobin: 10.2 g/dL — ABNORMAL LOW (ref 12.0–15.0)
Lymphocytes Relative: 12 %
Lymphs Abs: 1.3 10*3/uL (ref 0.7–4.0)
MCH: 28.4 pg (ref 26.0–34.0)
MCHC: 32.2 g/dL (ref 30.0–36.0)
MCV: 88.3 fL (ref 78.0–100.0)
MONO ABS: 0.6 10*3/uL (ref 0.1–1.0)
Monocytes Relative: 6 %
NEUTROS ABS: 7.9 10*3/uL — AB (ref 1.7–7.7)
Neutrophils Relative %: 78 %
PLATELETS: 126 10*3/uL — AB (ref 150–400)
RBC: 3.59 MIL/uL — ABNORMAL LOW (ref 3.87–5.11)
RDW: 16.2 % — AB (ref 11.5–15.5)
WBC: 10.2 10*3/uL (ref 4.0–10.5)

## 2017-01-24 LAB — COMPREHENSIVE METABOLIC PANEL
ALT: 17 U/L (ref 14–54)
ANION GAP: 11 (ref 5–15)
AST: 22 U/L (ref 15–41)
Albumin: 3.9 g/dL (ref 3.5–5.0)
Alkaline Phosphatase: 65 U/L (ref 38–126)
BILIRUBIN TOTAL: 0.6 mg/dL (ref 0.3–1.2)
BUN: 23 mg/dL — ABNORMAL HIGH (ref 6–20)
CO2: 22 mmol/L (ref 22–32)
Calcium: 9.4 mg/dL (ref 8.9–10.3)
Chloride: 103 mmol/L (ref 101–111)
Creatinine, Ser: 1.63 mg/dL — ABNORMAL HIGH (ref 0.44–1.00)
GFR calc Af Amer: 33 mL/min — ABNORMAL LOW (ref >60)
GFR calc non Af Amer: 29 mL/min — ABNORMAL LOW (ref >60)
GLUCOSE: 115 mg/dL — AB (ref 65–99)
POTASSIUM: 4.2 mmol/L (ref 3.5–5.1)
Sodium: 136 mmol/L (ref 135–145)
TOTAL PROTEIN: 7.1 g/dL (ref 6.5–8.1)

## 2017-01-24 LAB — URINALYSIS, ROUTINE W REFLEX MICROSCOPIC
Bacteria, UA: NONE SEEN
Bilirubin Urine: NEGATIVE
GLUCOSE, UA: 50 mg/dL — AB
Hgb urine dipstick: NEGATIVE
Ketones, ur: NEGATIVE mg/dL
LEUKOCYTES UA: NEGATIVE
Nitrite: NEGATIVE
PH: 7 (ref 5.0–8.0)
Protein, ur: 100 mg/dL — AB
RBC / HPF: NONE SEEN RBC/hpf (ref 0–5)
Specific Gravity, Urine: 1.01 (ref 1.005–1.030)
Squamous Epithelial / LPF: NONE SEEN

## 2017-01-24 LAB — CBG MONITORING, ED
GLUCOSE-CAPILLARY: 138 mg/dL — AB (ref 65–99)
Glucose-Capillary: 153 mg/dL — ABNORMAL HIGH (ref 65–99)
Glucose-Capillary: 125 mg/dL — ABNORMAL HIGH (ref 65–99)
Glucose-Capillary: 171 mg/dL — ABNORMAL HIGH (ref 65–99)
Glucose-Capillary: 237 mg/dL — ABNORMAL HIGH (ref 65–99)

## 2017-01-24 LAB — TROPONIN I

## 2017-01-24 LAB — I-STAT CG4 LACTIC ACID, ED: LACTIC ACID, VENOUS: 1.46 mmol/L (ref 0.5–1.9)

## 2017-01-24 NOTE — Discharge Instructions (Signed)
Please consider a different method of administering insulin to this patient as she has become hypoglycemic directly after receiving her dose of subcutaneous insulin.follow-up with the nursing home physician

## 2017-01-24 NOTE — ED Triage Notes (Signed)
Patient presents today from [facility]. Per EMS patient found unresponsive, CBG 44an  amp  D50 given.  CBG recheck 244. Patient alert and oriented x4 on arrival. Patient denies any complaints at this time.

## 2017-01-24 NOTE — ED Provider Notes (Signed)
[facility] DEPT Provider Note   CSN: [id number] Arrival date & time: 01/24/17  1416     History   Chief Complaint Chief Complaint  Patient presents with  . Hypoglycemia    HPI Ashley Velez is a 82 y.o. female.  82 year old female here with altered mental status from nursing home. History is limited due to her current state. According to EMS, patient was not responsive. Blood sugar was 40 and she was given an amp of D50 and she is now back to her baseline. No reported history of illness recently. No further history obtainable due to her current state      Past Medical History:  Diagnosis Date  . Diabetes mellitus without complication ([place])   . Hypertension   . Stroke [facility])     Patient Active Problem List   Diagnosis Date Noted  . Hypoglycemia 01/12/2017  . Status epilepticus ([place]) 01/12/2017  . AKI (acute kidney injury) ([place]) 01/12/2017  . Aspiration pneumonia ([place]) 01/12/2017  . Uncontrolled hypertension 01/12/2017  . Acute respiratory failure with hypoxia ([place])   . Seizure ([place]) 01/06/2017    History reviewed. No pertinent surgical history.  OB History    No data available       Home Medications    Prior to Admission medications   Medication Sig Start Date End Date Taking? Authorizing Provider  Calcium Carb-Cholecalciferol (CALCIUM 500 + D3 PO) Take 1 tablet by mouth daily.    [provider]  carvedilol (COREG) 12.5 MG tablet Take 12.5 mg by mouth 2 (two) times daily.  08/14/15   [provider]  clopidogrel (PLAVIX) 75 MG tablet Take 75 mg by mouth daily.  08/22/15   [provider]  cyanocobalamin (,VITAMIN B-12,) 1000 MCG/ML injection Inject 1,000 mcg into the muscle every 30 (thirty) days. ON THE 30TH OF THE MONTH    [provider]  felodipine (PLENDIL) 10 MG 24 hr tablet Take 1 tablet (10 mg total) by mouth daily. 01/12/17   Dhungel, Flonnie Overman, MD  Ferrous Sulfate (IRON) 28 MG TABS Take 28 mg by mouth daily.     [provider]  insulin lispro (HUMALOG) 100 UNIT/ML injection Inject into the skin 2 (two) times daily. "PER HOUSE SLIDING SCALE PROTOCOL"    [provider]  levothyroxine (LEVOXYL) 50 MCG tablet Take 50 mcg by mouth daily before breakfast.    [provider]  loratadine (CLARITIN) 10 MG tablet Take 10 mg by mouth daily.    [provider]  Maltodextrin-Xanthan Gum (RESOURCE THICKENUP CLEAR) POWD Take 120 g by mouth as needed (with all liquids). 01/12/17   Dhungel, Nishant, MD  OLANZapine (ZYPREXA) 15 MG tablet Take 15 mg by mouth at bedtime.  08/14/15   [provider]  ONE TOUCH ULTRA TEST test strip  07/25/15   [provider]  [facility] DELICA LANCETS FINE MISC 2 (two) times daily. for testing 07/25/15   [provider]  simvastatin (ZOCOR) 20 MG tablet Take 20 mg by mouth at bedtime. 08/10/15   [provider]  sodium bicarbonate 650 MG tablet Take 1 tablet (650 mg total) by mouth 2 (two) times daily. 01/12/17 01/12/18  Dhungel, Flonnie Overman, MD  Vitamin D, Ergocalciferol, (DRISDOL) 50000 UNITS CAPS capsule Take 50,000 Units by mouth once a week. [facility] 12/31/14   [provider]    Family History No family history on file.  Social History Social History  Substance Use Topics  . Smoking status: Never Smoker  . Smokeless tobacco:  Never Used  . Alcohol use No     Allergies   Fosamax [alendronate] and Macrodantin [nitrofurantoin]   Review of Systems Review of Systems  Unable to perform ROS: Acuity of condition     Physical Exam Updated Vital Signs BP 114/67   Pulse 65   Temp (!) 94.9 F (34.9 C) (Rectal)   Resp 16   Ht 1.651 m (5\' 5" )   Wt 49.9 kg (110 lb)   SpO2 99%   BMI 18.30 kg/m   Physical Exam  Constitutional: She is oriented to person, place, and time. She appears well-developed and well-nourished.  Non-toxic appearance. No distress.  HENT:  Head: Normocephalic and atraumatic.    Eyes: Conjunctivae, EOM and lids are normal. Pupils are equal, round, and reactive to light.  Neck: Normal range of motion. Neck supple. No tracheal deviation present. No thyroid mass present.  Cardiovascular: Normal rate, regular rhythm and normal heart sounds.  Exam reveals no gallop.   No murmur heard. Pulmonary/Chest: Effort normal and breath sounds normal. No stridor. No respiratory distress. She has no decreased breath sounds. She has no wheezes. She has no rhonchi. She has no rales.  Abdominal: Soft. Normal appearance and bowel sounds are normal. She exhibits no distension. There is no tenderness. There is no rebound and no CVA tenderness.  Musculoskeletal: Normal range of motion. She exhibits no edema or tenderness.  Neurological: She is alert and oriented to person, place, and time. She has normal strength. No cranial nerve deficit or sensory deficit. GCS eye subscore is 4. GCS verbal subscore is 5. GCS motor subscore is 6.  Skin: Skin is warm and dry. No abrasion and no rash noted.  Psychiatric: Her affect is blunt. Her speech is delayed. She is withdrawn.  Nursing note and vitals reviewed.    ED Treatments / Results  Labs (all labs ordered are listed, but only abnormal results are displayed) Labs Reviewed  CBG MONITORING, ED - Abnormal; Notable for the following:       Result Value   Glucose-Capillary 153 (*)    All other components within normal limits  CBG MONITORING, ED - Abnormal; Notable for the following:    Glucose-Capillary 125 (*)    All other components within normal limits  CBG MONITORING, ED - Abnormal; Notable for the following:    Glucose-Capillary 138 (*)    All other components within normal limits  CULTURE, BLOOD (ROUTINE X 2)  CULTURE, BLOOD (ROUTINE X 2)  URINE CULTURE  CBC WITH DIFFERENTIAL/PLATELET  COMPREHENSIVE METABOLIC PANEL  TROPONIN I  URINALYSIS, ROUTINE W REFLEX MICROSCOPIC  I-STAT CG4 LACTIC ACID, ED    EKG  EKG  Interpretation  Date/Time:  Sunday January 24 2017 14:35:03 EDT Ventricular Rate:  69 PR Interval:  148 QRS Duration: 84 QT Interval:  436 QTC Calculation: 467 R Axis:   38 Text Interpretation:  Normal sinus rhythm Normal ECG No acute changes No significant change since last tracing Confirmed by Varney Biles [phone]) on 01/24/2017 2:49:23 PM       Radiology No results found.  Procedures Procedures (including critical care time)  Medications Ordered in ED Medications - No data to display   Initial Impression / Assessment and Plan / ED Course  I have reviewed the triage vital signs and the nursing notes.  Pertinent labs & imaging results that were available during my care of the patient were reviewed by me and considered in my medical decision making (see chart for details).  According to family as well as nursing home staff patient has similar episode after being given insulin 10 units. She was fed here and blood sugar has stabilized. Will likely discharge back to nursing home.  Final Clinical Impressions(s) / ED Diagnoses   Final diagnoses:  None    New Prescriptions New Prescriptions   No medications on file     Lacretia Leigh, MD 01/24/17 [phone]

## 2017-01-24 NOTE — ED Notes (Signed)
Placed Pt on Bair hugger at [facility]

## 2017-01-24 NOTE — ED Notes (Signed)
Patient's family arrived and stated that this is the second time this (low blood sugar) has happened since patient has been at [facility]. Per family, pt's glucose level dropped after receiving insulin at the facility. Will call [facility] to see what patient was given PTA.

## 2017-01-24 NOTE — ED Notes (Signed)
MD aware pt is pale diaphoretic hypothermic. MD states to monitor Blood sugar apply bear hugger and will be in to see pt.

## 2017-01-24 NOTE — ED Notes (Signed)
[facility] contacted to transport patient back to [facility]

## 2017-01-24 NOTE — ED Notes (Signed)
[facility] and spoke with RN - informed them that patient's insulin needs to be adjusted or there needs to be a different method of regulating her blood sugars. Per RN at [facility], they are getting a new physician care team and the physician hasn't been there in a week and lives in [place]. They wanted to know if EDP could rewrite patient's sliding scale and/or discontinue her current orders for insulin. MD notified and explained that the nursing facility physician needs to reassess her and readjust things. RN explained this to Therapist, sports at [facility].   Called patient's daughter, Ardelle [place], and explained this to them as well.

## 2017-01-24 NOTE — ED Notes (Signed)
MD at bedside. 

## 2017-01-25 LAB — URINE CULTURE: CULTURE: NO GROWTH

## 2017-01-29 LAB — CULTURE, BLOOD (ROUTINE X 2)
CULTURE: NO GROWTH
Culture: NO GROWTH
SPECIAL REQUESTS: ADEQUATE
Special Requests: ADEQUATE

## 2017-03-26 ENCOUNTER — Encounter (HOSPITAL_COMMUNITY): Payer: Self-pay | Admitting: Emergency Medicine

## 2017-03-26 ENCOUNTER — Emergency Department (HOSPITAL_COMMUNITY): Payer: Medicare Other

## 2017-03-26 ENCOUNTER — Observation Stay (HOSPITAL_COMMUNITY)
Admission: EM | Admit: 2017-03-26 | Discharge: 2017-03-27 | Disposition: A | Discharge status: Skilled Nursing Facility | Payer: Medicare Other | Attending: Internal Medicine | Admitting: Internal Medicine

## 2017-03-26 DIAGNOSIS — R569 Unspecified convulsions: Secondary | ICD-10-CM

## 2017-03-26 DIAGNOSIS — Z888 Allergy status to other drugs, medicaments and biological substances status: Secondary | ICD-10-CM | POA: Insufficient documentation

## 2017-03-26 DIAGNOSIS — G3189 Other specified degenerative diseases of nervous system: Secondary | ICD-10-CM | POA: Diagnosis not present

## 2017-03-26 DIAGNOSIS — E11649 Type 2 diabetes mellitus with hypoglycemia without coma: Secondary | ICD-10-CM | POA: Diagnosis not present

## 2017-03-26 DIAGNOSIS — G9340 Encephalopathy, unspecified: Secondary | ICD-10-CM | POA: Diagnosis present

## 2017-03-26 DIAGNOSIS — Z79899 Other long term (current) drug therapy: Secondary | ICD-10-CM | POA: Diagnosis not present

## 2017-03-26 DIAGNOSIS — T404X5A Adverse effect of other synthetic narcotics, initial encounter: Secondary | ICD-10-CM | POA: Insufficient documentation

## 2017-03-26 DIAGNOSIS — Z7902 Long term (current) use of antithrombotics/antiplatelets: Secondary | ICD-10-CM | POA: Diagnosis not present

## 2017-03-26 DIAGNOSIS — Z794 Long term (current) use of insulin: Secondary | ICD-10-CM | POA: Insufficient documentation

## 2017-03-26 DIAGNOSIS — E871 Hypo-osmolality and hyponatremia: Secondary | ICD-10-CM | POA: Diagnosis not present

## 2017-03-26 DIAGNOSIS — E860 Dehydration: Secondary | ICD-10-CM | POA: Insufficient documentation

## 2017-03-26 DIAGNOSIS — G40909 Epilepsy, unspecified, not intractable, without status epilepticus: Secondary | ICD-10-CM | POA: Diagnosis not present

## 2017-03-26 DIAGNOSIS — Z9181 History of falling: Secondary | ICD-10-CM | POA: Diagnosis not present

## 2017-03-26 DIAGNOSIS — J90 Pleural effusion, not elsewhere classified: Secondary | ICD-10-CM | POA: Insufficient documentation

## 2017-03-26 DIAGNOSIS — R4182 Altered mental status, unspecified: Secondary | ICD-10-CM | POA: Diagnosis present

## 2017-03-26 DIAGNOSIS — E039 Hypothyroidism, unspecified: Secondary | ICD-10-CM | POA: Insufficient documentation

## 2017-03-26 DIAGNOSIS — G92 Toxic encephalopathy: Principal | ICD-10-CM | POA: Insufficient documentation

## 2017-03-26 DIAGNOSIS — R296 Repeated falls: Secondary | ICD-10-CM | POA: Diagnosis not present

## 2017-03-26 DIAGNOSIS — S72116S Nondisplaced fracture of greater trochanter of unspecified femur, sequela: Secondary | ICD-10-CM

## 2017-03-26 DIAGNOSIS — W010XXD Fall on same level from slipping, tripping and stumbling without subsequent striking against object, subsequent encounter: Secondary | ICD-10-CM | POA: Diagnosis not present

## 2017-03-26 DIAGNOSIS — S72115D Nondisplaced fracture of greater trochanter of left femur, subsequent encounter for closed fracture with routine healing: Secondary | ICD-10-CM | POA: Insufficient documentation

## 2017-03-26 DIAGNOSIS — R32 Unspecified urinary incontinence: Secondary | ICD-10-CM | POA: Insufficient documentation

## 2017-03-26 DIAGNOSIS — Z881 Allergy status to other antibiotic agents status: Secondary | ICD-10-CM | POA: Insufficient documentation

## 2017-03-26 DIAGNOSIS — I10 Essential (primary) hypertension: Secondary | ICD-10-CM | POA: Insufficient documentation

## 2017-03-26 DIAGNOSIS — I6782 Cerebral ischemia: Secondary | ICD-10-CM | POA: Diagnosis not present

## 2017-03-26 DIAGNOSIS — E875 Hyperkalemia: Secondary | ICD-10-CM | POA: Insufficient documentation

## 2017-03-26 DIAGNOSIS — Y92129 Unspecified place in nursing home as the place of occurrence of the external cause: Secondary | ICD-10-CM | POA: Diagnosis not present

## 2017-03-26 DIAGNOSIS — E785 Hyperlipidemia, unspecified: Secondary | ICD-10-CM | POA: Insufficient documentation

## 2017-03-26 DIAGNOSIS — Z8673 Personal history of transient ischemic attack (TIA), and cerebral infarction without residual deficits: Secondary | ICD-10-CM | POA: Insufficient documentation

## 2017-03-26 HISTORY — DX: Acute respiratory failure, unspecified whether with hypoxia or hypercapnia: J96.00

## 2017-03-26 HISTORY — DX: Epilepsy, unspecified, not intractable, without status epilepticus: G40.909

## 2017-03-26 LAB — CBC WITH DIFFERENTIAL/PLATELET
Basophils Absolute: 0 10*3/uL (ref 0.0–0.1)
Basophils Relative: 0 %
Eosinophils Absolute: 0.2 10*3/uL (ref 0.0–0.7)
Eosinophils Relative: 1 %
HEMATOCRIT: 30.6 % — AB (ref 36.0–46.0)
HEMOGLOBIN: 9.8 g/dL — AB (ref 12.0–15.0)
LYMPHS ABS: 2.4 10*3/uL (ref 0.7–4.0)
LYMPHS PCT: 20 %
MCH: 26.7 pg (ref 26.0–34.0)
MCHC: 32 g/dL (ref 30.0–36.0)
MCV: 83.4 fL (ref 78.0–100.0)
MONO ABS: 0.9 10*3/uL (ref 0.1–1.0)
MONOS PCT: 8 %
NEUTROS ABS: 8.2 10*3/uL — AB (ref 1.7–7.7)
NEUTROS PCT: 71 %
Platelets: 341 10*3/uL (ref 150–400)
RBC: 3.67 MIL/uL — ABNORMAL LOW (ref 3.87–5.11)
RDW: 15.5 % (ref 11.5–15.5)
WBC: 11.6 10*3/uL — ABNORMAL HIGH (ref 4.0–10.5)

## 2017-03-26 LAB — COMPREHENSIVE METABOLIC PANEL
ALT: 30 U/L (ref 14–54)
ANION GAP: 6 (ref 5–15)
AST: 31 U/L (ref 15–41)
Albumin: 2.3 g/dL — ABNORMAL LOW (ref 3.5–5.0)
Alkaline Phosphatase: 108 U/L (ref 38–126)
BUN: 20 mg/dL (ref 6–20)
CHLORIDE: 102 mmol/L (ref 101–111)
CO2: 21 mmol/L — AB (ref 22–32)
CREATININE: 1.59 mg/dL — AB (ref 0.44–1.00)
Calcium: 8.2 mg/dL — ABNORMAL LOW (ref 8.9–10.3)
GFR calc non Af Amer: 30 mL/min — ABNORMAL LOW (ref >60)
GFR, EST AFRICAN AMERICAN: 34 mL/min — AB (ref >60)
Glucose, Bld: 285 mg/dL — ABNORMAL HIGH (ref 65–99)
POTASSIUM: 5.4 mmol/L — AB (ref 3.5–5.1)
Sodium: 129 mmol/L — ABNORMAL LOW (ref 135–145)
Total Bilirubin: 0.5 mg/dL (ref 0.3–1.2)
Total Protein: 5.1 g/dL — ABNORMAL LOW (ref 6.5–8.1)

## 2017-03-26 LAB — URINALYSIS, ROUTINE W REFLEX MICROSCOPIC
BACTERIA UA: NONE SEEN
Bilirubin Urine: NEGATIVE
GLUCOSE, UA: 150 mg/dL — AB
KETONES UR: NEGATIVE mg/dL
LEUKOCYTES UA: NEGATIVE
Nitrite: NEGATIVE
PH: 8 (ref 5.0–8.0)
PROTEIN: 30 mg/dL — AB
RBC / HPF: NONE SEEN RBC/hpf (ref 0–5)
SQUAMOUS EPITHELIAL / LPF: NONE SEEN
Specific Gravity, Urine: 1.005 (ref 1.005–1.030)
WBC, UA: NONE SEEN WBC/hpf (ref 0–5)

## 2017-03-26 LAB — BASIC METABOLIC PANEL
Anion gap: 10 (ref 5–15)
BUN: 15 mg/dL (ref 6–20)
CHLORIDE: 106 mmol/L (ref 101–111)
CO2: 19 mmol/L — AB (ref 22–32)
Calcium: 8.2 mg/dL — ABNORMAL LOW (ref 8.9–10.3)
Creatinine, Ser: 1.47 mg/dL — ABNORMAL HIGH (ref 0.44–1.00)
GFR calc non Af Amer: 32 mL/min — ABNORMAL LOW (ref >60)
GFR, EST AFRICAN AMERICAN: 38 mL/min — AB (ref >60)
Glucose, Bld: 188 mg/dL — ABNORMAL HIGH (ref 65–99)
POTASSIUM: 4.9 mmol/L (ref 3.5–5.1)
SODIUM: 135 mmol/L (ref 135–145)

## 2017-03-26 LAB — MAGNESIUM: Magnesium: 1.8 mg/dL (ref 1.7–2.4)

## 2017-03-26 LAB — RAPID URINE DRUG SCREEN, HOSP PERFORMED
Amphetamines: NOT DETECTED
Barbiturates: NOT DETECTED
Benzodiazepines: NOT DETECTED
COCAINE: NOT DETECTED
OPIATES: NOT DETECTED
TETRAHYDROCANNABINOL: NOT DETECTED

## 2017-03-26 LAB — GLUCOSE, CAPILLARY
GLUCOSE-CAPILLARY: 295 mg/dL — AB (ref 65–99)
GLUCOSE-CAPILLARY: 232 mg/dL — AB (ref 65–99)

## 2017-03-26 LAB — TSH: TSH: 4.824 u[IU]/mL — ABNORMAL HIGH (ref 0.350–4.500)

## 2017-03-26 LAB — POTASSIUM: Potassium: 5.4 mmol/L — ABNORMAL HIGH (ref 3.5–5.1)

## 2017-03-26 LAB — AMMONIA: AMMONIA: 19 umol/L (ref 9–35)

## 2017-03-26 LAB — PHOSPHORUS: Phosphorus: 3.2 mg/dL (ref 2.5–4.6)

## 2017-03-26 MED ORDER — LORAZEPAM 2 MG/ML IJ SOLN: 1.0000 mg | Freq: Four times a day (QID) | INTRAMUSCULAR | Status: DC | PRN

## 2017-03-26 MED ORDER — SIMVASTATIN 20 MG PO TABS
20.0000 mg | ORAL_TABLET | Freq: Every day | ORAL | Status: DC
  Administered 2017-03-26: 20 mg via ORAL
  Filled 2017-03-26: qty 1

## 2017-03-26 MED ORDER — FUROSEMIDE 10 MG/ML IJ SOLN
20.0000 mg | Freq: Once | INTRAMUSCULAR | Status: AC
  Administered 2017-03-26: 20 mg via INTRAVENOUS
  Filled 2017-03-26: qty 2

## 2017-03-26 MED ORDER — LEVOTHYROXINE SODIUM 50 MCG PO TABS
50.0000 ug | ORAL_TABLET | Freq: Every day | ORAL | Status: DC
  Administered 2017-03-27: 50 ug via ORAL
  Filled 2017-03-26: qty 1

## 2017-03-26 MED ORDER — CARVEDILOL 12.5 MG PO TABS
12.5000 mg | ORAL_TABLET | Freq: Two times a day (BID) | ORAL | Status: DC
  Administered 2017-03-27: 12.5 mg via ORAL
  Filled 2017-03-26: qty 1

## 2017-03-26 MED ORDER — ONDANSETRON HCL 4 MG PO TABS: 4.0000 mg | ORAL_TABLET | Freq: Four times a day (QID) | ORAL | Status: DC | PRN

## 2017-03-26 MED ORDER — CLOPIDOGREL BISULFATE 75 MG PO TABS
75.0000 mg | ORAL_TABLET | Freq: Every day | ORAL | Status: DC
  Administered 2017-03-26 – 2017-03-27 (×2): 75 mg via ORAL
  Filled 2017-03-26 (×2): qty 1

## 2017-03-26 MED ORDER — HYDRALAZINE HCL 20 MG/ML IJ SOLN: 10.0000 mg | Freq: Three times a day (TID) | INTRAMUSCULAR | Status: DC | PRN

## 2017-03-26 MED ORDER — POTASSIUM CHLORIDE CRYS ER 20 MEQ PO TBCR
20.0000 meq | EXTENDED_RELEASE_TABLET | Freq: Every day | ORAL | Status: DC
  Administered 2017-03-26 – 2017-03-27 (×2): 20 meq via ORAL
  Filled 2017-03-26 (×2): qty 1

## 2017-03-26 MED ORDER — ONDANSETRON HCL 4 MG/2ML IJ SOLN: 4.0000 mg | Freq: Four times a day (QID) | INTRAMUSCULAR | Status: DC | PRN

## 2017-03-26 MED ORDER — HEPARIN SODIUM (PORCINE) 5000 UNIT/ML IJ SOLN
5000.0000 [IU] | Freq: Three times a day (TID) | INTRAMUSCULAR | Status: DC
  Administered 2017-03-26 – 2017-03-27 (×3): 5000 [IU] via SUBCUTANEOUS
  Filled 2017-03-26 (×3): qty 1

## 2017-03-26 MED ORDER — HYDROCODONE-ACETAMINOPHEN 5-325 MG PO TABS
1.0000 | ORAL_TABLET | Freq: Once | ORAL | Status: AC
  Administered 2017-03-26: 1 via ORAL
  Filled 2017-03-26: qty 1

## 2017-03-26 MED ORDER — INSULIN ASPART 100 UNIT/ML ~~LOC~~ SOLN
0.0000 [IU] | Freq: Three times a day (TID) | SUBCUTANEOUS | Status: DC
  Administered 2017-03-26: 8 [IU] via SUBCUTANEOUS

## 2017-03-26 MED ORDER — ACETAMINOPHEN 650 MG RE SUPP: 650.0000 mg | Freq: Four times a day (QID) | RECTAL | Status: DC | PRN

## 2017-03-26 MED ORDER — OLANZAPINE 5 MG PO TABS
10.0000 mg | ORAL_TABLET | Freq: Every day | ORAL | Status: DC
  Administered 2017-03-26: 10 mg via ORAL
  Filled 2017-03-26: qty 2

## 2017-03-26 MED ORDER — ACETAMINOPHEN 325 MG PO TABS
650.0000 mg | ORAL_TABLET | Freq: Four times a day (QID) | ORAL | Status: DC | PRN
  Administered 2017-03-27: 650 mg via ORAL
  Filled 2017-03-26: qty 2

## 2017-03-26 MED ORDER — SODIUM CHLORIDE 0.9 % IV SOLN
INTRAVENOUS | Status: AC
  Administered 2017-03-26 (×2): via INTRAVENOUS

## 2017-03-26 MED ORDER — SODIUM CHLORIDE 0.9 % IV BOLUS (SEPSIS)
1000.0000 mL | Freq: Once | INTRAVENOUS | Status: AC
  Administered 2017-03-26: 1000 mL via INTRAVENOUS

## 2017-03-26 NOTE — ED Notes (Signed)
Pt refusing to walk.

## 2017-03-26 NOTE — Evaluation (Signed)
Physical Therapy Evaluation Patient Details Name: Ashley Velez MRN: [id number] DOB: Feb 21, 1935 Today's Date: 03/26/2017   History of Present Illness  Pt adm from SNF with AMS. Pt with history of falls including recent falls. X-ray revealed Nondisplaced fracture of the greater trochanter of the left femur. Per Dr. Aggie Moats note on 8/31 pt is WBAT on LLE.  PMH - DM, CVA, HTN, seizure  Clinical Impression  Pt admitted with above diagnosis and presents to PT with functional limitations due to deficits listed below (See PT problem list). Pt needs skilled PT to maximize independence and safety to allow discharge to back to SNF for further therapy. New lt greater trochanter fracture will require further rehab to assist pt with return to ambulation.     Follow Up Recommendations SNF    Equipment Recommendations  None recommended by PT    Recommendations for Other Services       Precautions / Restrictions Precautions Precautions: Fall Restrictions Weight Bearing Restrictions: Yes LLE Weight Bearing: Weight bearing as tolerated      Mobility  Bed Mobility Overal bed mobility: Needs Assistance Bed Mobility: Supine to Sit     Supine to sit: Max assist     General bed mobility comments: Assist to bring legs off, elevate trunk, and bring hips to EOB. Seemed assist heavier more due to cognition versus weakness or pain.  Transfers Overall transfer level: Needs assistance Equipment used: Rolling walker (2 wheeled) Transfers: Sit to/from Stand Sit to Stand: Min assist         General transfer comment: Assist to bring hips up. Had pt place hands on walker to encourage anterior translation.  Ambulation/Gait Ambulation/Gait assistance: Min assist;Mod assist;+2 safety/equipment Ambulation Distance (Feet): 15 Feet Assistive device: Rolling walker (2 wheeled) Gait Pattern/deviations: Step-through pattern;Scissoring;Narrow base of support Gait velocity: decr Gait velocity  interpretation: Below normal speed for age/gender General Gait Details: Assist for balance and support. Pt keeping base too narrow and stepping on own foot or sock causing decr balance and needing mod assist to correct  Stairs            Wheelchair Mobility    Modified Rankin (Stroke Patients Only)       Balance Overall balance assessment: Needs assistance Sitting-balance support: No upper extremity supported;Feet supported Sitting balance-Leahy Scale: Fair     Standing balance support: Bilateral upper extremity supported Standing balance-Leahy Scale: Poor Standing balance comment: walker and min assist for static standing                             Pertinent Vitals/Pain Pain Assessment: Faces Faces Pain Scale: Hurts a little bit Pain Location: lt hip Pain Descriptors / Indicators: Guarding Pain Intervention(s): Limited activity within patient's tolerance;Monitored during session    Home Living Family/patient expects to be discharged to:: Skilled nursing facility                      Prior Function Level of Independence: Needs assistance   Gait / Transfers Assistance Needed: Pt unable to relate to me. Was ambulatory to some extent unsure of level of assist.           Hand Dominance   Dominant Hand: Right    Extremity/Trunk Assessment   Upper Extremity Assessment Upper Extremity Assessment: Generalized weakness    Lower Extremity Assessment Lower Extremity Assessment: Generalized weakness       Communication      Cognition Arousal/Alertness:  Awake/alert Behavior During Therapy: WFL for tasks assessed/performed Overall Cognitive Status: Impaired/Different from baseline Area of Impairment: Orientation;Memory;Safety/judgement;Problem solving                 Orientation Level: Disoriented to;Place;Time;Situation   Memory: Decreased short-term memory   Safety/Judgement: Decreased awareness of deficits   Problem Solving:  Requires verbal cues;Requires tactile cues;Slow processing        General Comments      Exercises     Assessment/Plan    PT Assessment Patient needs continued PT services  PT Problem List Decreased strength;Decreased activity tolerance;Decreased balance;Decreased mobility;Decreased cognition;Decreased knowledge of use of DME       PT Treatment Interventions DME instruction;Gait training;Functional mobility training;Therapeutic activities;Therapeutic exercise;Balance training;Patient/family education    PT Goals (Current goals can be found in the Care Plan section)  Acute Rehab PT Goals Patient Stated Goal: Not stated PT Goal Formulation: With patient/family Time For Goal Achievement: 04/02/17 Potential to Achieve Goals: Good    Frequency Min 3X/week   Barriers to discharge        Co-evaluation               AM-PAC PT "6 Clicks" Daily Activity  Outcome Measure Difficulty turning over in bed (including adjusting bedclothes, sheets and blankets)?: Unable Difficulty moving from lying on back to sitting on the side of the bed? : Unable Difficulty sitting down on and standing up from a chair with arms (e.g., wheelchair, bedside commode, etc,.)?: Unable Help needed moving to and from a bed to chair (including a wheelchair)?: A Little Help needed walking in hospital room?: A Lot Help needed climbing 3-5 steps with a railing? : Total 6 Click Score: 9    End of Session Equipment Utilized During Treatment: Gait belt Activity Tolerance: Patient tolerated treatment well Patient left: in chair;with call bell/phone within reach;with chair alarm set Nurse Communication: Mobility status PT Visit Diagnosis: Unsteadiness on feet (R26.81);Other abnormalities of gait and mobility (R26.89);Repeated falls (R29.6);Muscle weakness (generalized) (M62.81)    Time: 6256-3893 PT Time Calculation (min) (ACUTE ONLY): 20 min   Charges:   PT Evaluation $PT Eval Moderate Complexity: 1  Mod     PT G CodesMarland Kitchen        [facility] [place] 03/26/2017, 5:00 PM

## 2017-03-26 NOTE — ED Notes (Signed)
Pt to CT

## 2017-03-26 NOTE — ED Provider Notes (Signed)
[facility] DEPT Provider Note   CSN: [id number] Arrival date & time: 03/26/17  0030     History   Chief Complaint No chief complaint on file.   HPI Ashley Velez is a 82 y.o. female.  Level V caveat for altered mental status. Patient presents from nursing facility with increasing confusion recurrent falls and urinary incontinence. Discussed with her nurse at [facility] place. She states patient has had 3 falls in the past 2 days has been incontinent of urine which is not her normal. She was found on the floor today without any obvious injury. She's had increased confusion. She is normally alert and oriented to person place and can care for herself and walks with a walker. Now she is oriented to person only. She has been on tramadol 25 mg for the past 2 weeks but this was increased to 50 mg twice a day 2 days ago. No known fevers. No vomiting or diarrhea. Patient denies any pain.   The history is provided by the patient, the EMS personnel and the nursing home. The history is limited by the condition of the patient.    Past Medical History:  Diagnosis Date  . Diabetes mellitus without complication ([place])   . Hypertension   . Stroke [facility])     Patient Active Problem List   Diagnosis Date Noted  . Hypoglycemia 01/12/2017  . Status epilepticus ([place]) 01/12/2017  . AKI (acute kidney injury) ([place]) 01/12/2017  . Aspiration pneumonia ([place]) 01/12/2017  . Uncontrolled hypertension 01/12/2017  . Acute respiratory failure with hypoxia ([place])   . Seizure ([place]) 01/06/2017    No past surgical history on file.  OB History    No data available       Home Medications    Prior to Admission medications   Medication Sig Start Date End Date Taking? Authorizing Provider  amoxicillin-clavulanate (AUGMENTIN) 500-125 MG tablet Take 1 tablet by mouth 2 (two) times daily.    [provider]  Calcium Carb-Cholecalciferol (CALCIUM 500 + D3 PO) Take 1 tablet by mouth daily.     [provider]  carvedilol (COREG) 12.5 MG tablet Take 12.5 mg by mouth 2 (two) times daily.  08/14/15   [provider]  clopidogrel (PLAVIX) 75 MG tablet Take 75 mg by mouth daily.  08/22/15   [provider]  cyanocobalamin (,VITAMIN B-12,) 1000 MCG/ML injection Inject 1,000 mcg into the muscle every 30 (thirty) days. ON THE 30TH OF THE MONTH    [provider]  felodipine (PLENDIL) 10 MG 24 hr tablet Take 1 tablet (10 mg total) by mouth daily. 01/12/17   Dhungel, Flonnie Overman, MD  Ferrous Sulfate (IRON) 28 MG TABS Take 28 mg by mouth daily.    [provider]  insulin lispro (HUMALOG) 100 UNIT/ML injection Inject into the skin 2 (two) times daily. "PER HOUSE SLIDING SCALE PROTOCOL"    [provider]  levothyroxine (LEVOXYL) 50 MCG tablet Take 50 mcg by mouth daily before breakfast.    [provider]  loratadine (CLARITIN) 10 MG tablet Take 10 mg by mouth daily.    [provider]  Maltodextrin-Xanthan Gum (RESOURCE THICKENUP CLEAR) POWD Take 120 g by mouth as needed (with all liquids). 01/12/17   Dhungel, Nishant, MD  OLANZapine (ZYPREXA) 15 MG tablet Take 15 mg by mouth at bedtime.  08/14/15   [provider]  simvastatin (ZOCOR) 20 MG tablet Take 20 mg by mouth at bedtime. 08/10/15   [provider]  sodium bicarbonate  650 MG tablet Take 1 tablet (650 mg total) by mouth 2 (two) times daily. 01/12/17 01/12/18  Dhungel, Flonnie Overman, MD  Vitamin D, Ergocalciferol, (DRISDOL) 50000 UNITS CAPS capsule Take 50,000 Units by mouth once a week. [facility] 12/31/14   [provider]    Family History No family history on file.  Social History Social History  Substance Use Topics  . Smoking status: Never Smoker  . Smokeless tobacco: Never Used  . Alcohol use No     Allergies   Fosamax [alendronate] and Macrodantin [nitrofurantoin]   Review of Systems Review of Systems  Unable to perform ROS: Mental status  change     Physical Exam Updated Vital Signs BP (!) 174/26 (BP Location: Left Arm)   Pulse 75   Temp 98.5 F (36.9 C) (Oral)   Resp 18   Ht 5\' 3"  (1.6 m)   Wt 52.2 kg (115 lb)   SpO2 100%   BMI 20.37 kg/m   Physical Exam  Constitutional: She appears well-developed and well-nourished. No distress.  HENT:  Head: Normocephalic and atraumatic.  Mouth/Throat: Oropharynx is clear and moist. No oropharyngeal exudate.  Eyes: Pupils are equal, round, and reactive to light. Conjunctivae and EOM are normal.  Neck: Normal range of motion. Neck supple.  No C spine tenderness  Cardiovascular: Normal rate, regular rhythm, normal heart sounds and intact distal pulses.   No murmur heard. Pulmonary/Chest: Effort normal and breath sounds normal. No respiratory distress. She exhibits no tenderness.  Abdominal: Soft. There is no tenderness. There is no rebound and no guarding.  Musculoskeletal: Normal range of motion. She exhibits edema and tenderness.  Slight pain with range of motion of bilateral hips. There is no noted external rotation. Trace pretibial edema bilaterally  Neurological: She is alert. No cranial nerve deficit. She exhibits normal muscle tone. Coordination normal.   5/5 strength throughout. CN 2-12 intact.Equal grip strength.  Oriented to person only  Skin: Skin is warm.  Psychiatric: She has a normal mood and affect. Her behavior is normal.  Nursing note and vitals reviewed.    ED Treatments / Results  Labs (all labs ordered are listed, but only abnormal results are displayed) Labs Reviewed  COMPREHENSIVE METABOLIC PANEL - Abnormal; Notable for the following:       Result Value   Sodium 129 (*)    Potassium 5.4 (*)    CO2 21 (*)    Glucose, Bld 285 (*)    Creatinine, Ser 1.59 (*)    Calcium 8.2 (*)    Total Protein 5.1 (*)    Albumin 2.3 (*)    GFR calc non Af Amer 30 (*)    GFR calc Af Amer 34 (*)    All other components within normal limits  URINALYSIS,  ROUTINE W REFLEX MICROSCOPIC - Abnormal; Notable for the following:    Color, Urine STRAW (*)    Glucose, UA 150 (*)    Hgb urine dipstick SMALL (*)    Protein, ur 30 (*)    All other components within normal limits  TSH - Abnormal; Notable for the following:    TSH 4.824 (*)    All other components within normal limits  CBC WITH DIFFERENTIAL/PLATELET - Abnormal; Notable for the following:    WBC 11.6 (*)    RBC 3.67 (*)    Hemoglobin 9.8 (*)    HCT 30.6 (*)    Neutro Abs 8.2 (*)    All other components within normal limits  POTASSIUM -  Abnormal; Notable for the following:    Potassium 5.4 (*)    All other components within normal limits  BASIC METABOLIC PANEL - Abnormal; Notable for the following:    CO2 19 (*)    Glucose, Bld 188 (*)    Creatinine, Ser 1.47 (*)    Calcium 8.2 (*)    GFR calc non Af Amer 32 (*)    GFR calc Af Amer 38 (*)    All other components within normal limits  AMMONIA  RAPID URINE DRUG SCREEN, HOSP PERFORMED  MAGNESIUM  PHOSPHORUS  CBC WITH DIFFERENTIAL/PLATELET  BASIC METABOLIC PANEL  CBC    EKG  EKG Interpretation None       Radiology Dg Chest 2 View  Result Date: 03/26/2017 CLINICAL DATA:  Altered mental status EXAM: CHEST  2 VIEW COMPARISON:  01/07/2017 FINDINGS: Tiny pleural effusions on the lateral view. No focal consolidation. Heart size upper normal. Aortic atherosclerosis. No pneumothorax. Osteopenia of the spine. IMPRESSION: Tiny pleural effusions.  No focal consolidation or edema. Electronically Signed   By: Donavan Foil M.D.   On: 03/26/2017 02:14   Dg Pelvis 1-2 Views  Result Date: 03/26/2017 CLINICAL DATA:  Altered mental status, pain EXAM: PELVIS - 1-2 VIEW COMPARISON:  None. FINDINGS: Vascular calcifications. No acute osseous abnormality on the right. Sclerosis and fusion at the pubic symphysis. Suspect nondisplaced fracture involving the greater trochanter of the left femur. IMPRESSION: 1. Nondisplaced fracture of the greater  trochanter of the left femur. 2. No dislocation. Electronically Signed   By: Donavan Foil M.D.   On: 03/26/2017 02:16   Ct Head Wo Contrast  Result Date: 03/26/2017 CLINICAL DATA:  Altered mental status EXAM: CT HEAD WITHOUT CONTRAST TECHNIQUE: Contiguous axial images were obtained from the base of the skull through the vertex without intravenous contrast. COMPARISON:  MRI 01/08/2017, CT head 01/06/2017 FINDINGS: Brain: No acute territorial infarction, hemorrhage or intracranial mass is seen. Old lacunar infarcts in the right basal ganglia and small old infarcts in the right cerebellum. Mild small vessel ischemic changes of the white matter. Stable atrophy. Stable ventricle size. Vascular: No hyperdense vessels. Vertebral artery and carotid artery calcification. Skull: Negative for fracture or suspicious bone lesion. Left mastoid sclerosis with minimal fluid. Sinuses/Orbits: No acute finding. Other: None IMPRESSION: 1. No CT evidence for acute intracranial abnormality. 2. Atrophy and small vessel ischemic changes of the white matter with old right basal ganglia and cerebellar infarcts Electronically Signed   By: Donavan Foil M.D.   On: 03/26/2017 01:49    Procedures Procedures (including critical care time)  Medications Ordered in ED Medications - No data to display   Initial Impression / Assessment and Plan / ED Course  I have reviewed the triage vital signs and the nursing notes.  Pertinent labs & imaging results that were available during my care of the patient were reviewed by me and considered in my medical decision making (see chart for details).     Patient from nursing home with increased confusion, recurrent falls and urinary incontinence. Tramadol recently increased.  Imaging shows left greater trochanter fracture. There is no femoral neck fracture. Discussed with Dr. Ninfa Linden orthopedics who agrees with weightbearing as tolerated.  CT head stable. CXR and UA negative. Labs with  mild hyperkalemia. No EKG changes.  Unsure of baseline but appears comfortable. Oriented x1. No evidence of infection. Recent tramadol increase likely attributing to encephalopathy.  Should be discontinued especially with history of seizures.   Patient refusing to  try to walk. C/o L hip pain even after pain medication.  Walks with walker at baseline per ECF staff.  Stop tramadol, recheck K, hydrate. Admit with ongoing confusion and new pain from greater trochanter fracture. D/w Dr. Aggie Moats. Final Clinical Impressions(s) / ED Diagnoses   Final diagnoses:  Altered mental status, unspecified altered mental status type  Hyperkalemia  Closed nondisplaced fracture of greater trochanter of femur, unspecified laterality, sequela    New Prescriptions New Prescriptions   No medications on file     Ezequiel Essex, MD 03/26/17 1709

## 2017-03-26 NOTE — ED Notes (Signed)
Staff unable to get EKG due presence of artifact on the monitor.  Patient is very tense and has to be encouraged to relax.

## 2017-03-26 NOTE — ED Notes (Signed)
Attempted to ambulated patient at bedside, patient was very unsteady on her feet at this time. Notified provider of findings.

## 2017-03-26 NOTE — ED Notes (Signed)
Pt stated she didn't want to walk until in the morning.

## 2017-03-26 NOTE — Clinical Social Work Note (Signed)
Clinical Social Work Assessment  Patient Details  Name: Ashley Velez MRN: [id number] Date of Birth: 08-22-1934  Date of referral:  03/26/17               Reason for consult:  Facility Placement                Permission sought to share information with:  Chartered certified accountant granted to share information::  Yes, Verbal Permission Granted  Name::     Ardelle Park  Agency::  [facility]  Relationship::  daughter  Contact Information:     Housing/Transportation Living arrangements for the past 2 months:  [place] of Information:  Facility, Medical Team, Adult Children Patient Interpreter Needed:  None Criminal Activity/Legal Involvement Pertinent to Current Situation/Hospitalization:  No - Comment as needed Significant Relationships:  Adult Children Lives with:  Facility Resident Do you feel safe going back to the place where you live?  Yes Need for family participation in patient care:  Yes (Comment)  Care giving concerns:  Pt from [facility], possible long term resident, per [place] in admission. Pt is in observation status and it is not clear if she will need skilled and/or become inpatient. Pt not ready yet to return to SNF.  Daughter arrived after assessment and CSW was able to introduce self to Ardelle Park who confirmed that patient from facility and would return to [facility] and discuss transition back to facility when medically ready.   Social Worker assessment / plan:  CSW met with patient/daughter and discussed the plan to return to [facility]. Daughter in agreement. CSW unsure of what the plan will be as patient observation started. CSW will continue to assist for safe DC plan to facility when appropriate.  FL2 started. Passr confirmed.  Employment status:  Retired Nurse, adult PT Recommendations:  [place] / Referral to community resources:  [place]  Patient/Family's Response to care:  Family appreciative of CSW following up and in agreement with pt returning to [facility]. No issues or concerns at this time.  Patient/Family's Understanding of and Emotional Response to Diagnosis, Current Treatment, and Prognosis:  Patient/family is unsure what the medical plan will be for patient. Daughter following up with clinical team on current treatment and prognosis. When it is clear on what the plan will be and intervention, patient can return to [facility]. CSW will assist as needed with transition when ready.  Emotional Assessment Appearance:  Appears stated age Attitude/Demeanor/Rapport:   (Cooperative) Affect (typically observed):  Accepting, Appropriate Orientation:  Oriented to Self, Oriented to Place, Oriented to Situation Alcohol / Substance use:  Not Applicable Psych involvement (Current and /or in the community):  No (Comment)  Discharge Needs  Concerns to be addressed:  Care Coordination Readmission within the last 30 days:  No Current discharge risk:  Physical Impairment, Dependent with Mobility Barriers to Discharge:  No Barriers Identified   Normajean Baxter, LCSW 03/26/2017, 4:14 PM

## 2017-03-26 NOTE — Care Management Note (Addendum)
Case Management Note  Patient Details  Name: Ashley Velez MRN: [id number] Date of Birth: 1935/06/02  Subjective/Objective:                 Patient in obs for acute encephalopathy from SNF. Mardene Celeste CSW notified of SNF admission (possibly LTC @ [facility]).    Action/Plan:  CM/ CSW will continue to  Follow.   Expected Discharge Date:                  Expected Discharge Plan:  Skilled Nursing Facility  In-House Referral:  Clinical Social Work  Discharge planning Services  CM Consult  Post Acute Care Choice:    Choice offered to:     DME Arranged:    DME Agency:     HH Arranged:    [place] Agency:     Status of Service:  In process, will continue to follow  If discussed at Long Length of Stay Meetings, dates discussed:    Additional Comments:  Carles Collet, RN 03/26/2017, 11:46 AM

## 2017-03-26 NOTE — ED Notes (Signed)
Pt to X-ray from CT.

## 2017-03-26 NOTE — ED Triage Notes (Signed)
BIB [facility] from [facility] for "Altered Mental Status."  Staff found patient sitting on the floor of her room, acting confused.  Patient did not fall per facility and EMS.  Staff MD increased pt's tramadol from 25mg  to 50mg  today.  Staff sts she has been confused for "a couple days."  Pt follows commands, oriented to self, no neuro deficits.  VS: 76 HR, 177/86, 18 RR.  CBG 289.  [facility] [phone]

## 2017-03-26 NOTE — ED Notes (Signed)
ED Provider at bedside. 

## 2017-03-26 NOTE — Social Work (Signed)
CSW contacted Olivia Mackie in admission at [facility] to discuss patient and confirm she is from SNF. CSW unable to reach and left message for call back.  CSW will continue to follow as patient in observation status and not clear if patient will become inpatient.  CSW will continue to follow.  Elissa Hefty, LCSW Clinical Social Worker [phone]

## 2017-03-26 NOTE — H&P (Signed)
[facility] History and Physical  Shaketha Jeon VXB:[id number] DOB: 1935/05/03 DOA: 03/26/2017  Referring physician:  PCP: Crist Infante, MD   Chief Complaint: ams  HPI: Ashley Velez is a 82 y.o. female  pmhx  dm, htn, stroke, seizure. Of note pt has hx of falls. Recently fell and fractured hip. Her tramadol dose was increased. Soon after she became oriented to person only and incontinent of urine. SNF had pt sent to ed for eval.  ED Course: Neg head CT. Electrolytes abnormal. Hospitalists consulted for admit.  Review of Systems:  As per HPI otherwise 10 point review of systems negative.    Past Medical History:  Diagnosis Date  . Acute respiratory failure ([place]) 09/2016  . Diabetes mellitus without complication ([place])   . Epilepsy ([place])   . Hypertension   . Stroke [facility])    History reviewed. No pertinent surgical history. Social History:  reports that she has never smoked. She has never used smokeless tobacco. She reports that she does not drink alcohol or use drugs.  Allergies  Allergen Reactions  . Fosamax [Alendronate]     Noted to be an allergy on the patient's paperwork from facility, but no reaction is recorded  . Macrodantin [Nitrofurantoin]     Noted to be an allergy on the patient's paperwork from facility, but no reaction is recorded    No family history on file.   Prior to Admission medications   Medication Sig Start Date End Date Taking? Authorizing Provider  amoxicillin-clavulanate (AUGMENTIN) 500-125 MG tablet Take 1 tablet by mouth 2 (two) times daily.    [provider]  Calcium Carb-Cholecalciferol (CALCIUM 500 + D3 PO) Take 1 tablet by mouth daily.    [provider]  carvedilol (COREG) 12.5 MG tablet Take 12.5 mg by mouth 2 (two) times daily.  08/14/15   [provider]  clopidogrel (PLAVIX) 75 MG tablet Take 75 mg by mouth daily.  08/22/15   [provider]  cyanocobalamin (,VITAMIN B-12,) 1000 MCG/ML  injection Inject 1,000 mcg into the muscle every 30 (thirty) days. ON THE 30TH OF THE MONTH    [provider]  felodipine (PLENDIL) 10 MG 24 hr tablet Take 1 tablet (10 mg total) by mouth daily. 01/12/17   Dhungel, Flonnie Overman, MD  Ferrous Sulfate (IRON) 28 MG TABS Take 28 mg by mouth daily.    [provider]  insulin lispro (HUMALOG) 100 UNIT/ML injection Inject into the skin 2 (two) times daily. "PER HOUSE SLIDING SCALE PROTOCOL"    [provider]  levothyroxine (LEVOXYL) 50 MCG tablet Take 50 mcg by mouth daily before breakfast.    [provider]  loratadine (CLARITIN) 10 MG tablet Take 10 mg by mouth daily.    [provider]  Maltodextrin-Xanthan Gum (RESOURCE THICKENUP CLEAR) POWD Take 120 g by mouth as needed (with all liquids). 01/12/17   Dhungel, Nishant, MD  OLANZapine (ZYPREXA) 15 MG tablet Take 15 mg by mouth at bedtime.  08/14/15   [provider]  simvastatin (ZOCOR) 20 MG tablet Take 20 mg by mouth at bedtime. 08/10/15   [provider]  sodium bicarbonate 650 MG tablet Take 1 tablet (650 mg total) by mouth 2 (two) times daily. 01/12/17 01/12/18  Dhungel, Flonnie Overman, MD  Vitamin D, Ergocalciferol, (DRISDOL) 50000 UNITS CAPS capsule Take 50,000 Units by mouth once a week. [facility] 12/31/14   [provider]   Physical Exam: Vitals:   03/26/17 0400 03/26/17 0430 03/26/17 0500 03/26/17 0530  BP: (!) 177/74 (!) 164/76 (!) 173/69 (!) 152/70  Pulse: 76 76 80 61  Resp: 19 (!) 22 19 15   Temp:      TempSrc:      SpO2: 98% 99% 98% 97%  Weight:      Height:        Wt Readings from Last 3 Encounters:  03/26/17 52.2 kg (115 lb)  01/24/17 49.9 kg (110 lb)  01/11/17 52.9 kg (116 lb 11.2 oz)    General:  Appears calm and comfortable; A&Ox2 Eyes:  PERRL, EOMI, normal lids, iris ENT:  grossly normal hearing, lips & tongue Neck:  no LAD, masses or thyromegaly Cardiovascular:  RRR, no m/r/g. No LE edema.  Respiratory:  CTA  bilaterally, no w/r/r. Normal respiratory effort. Abdomen:  soft, ntnd Skin:  no rash or induration seen on limited exam Musculoskeletal:  grossly normal tone BUE/BLE Psychiatric:  grossly normal mood and affect, speech fluent and appropriate Neurologic:  CN 2-12 grossly intact, moves all extremities in coordinated fashion.          Labs on Admission:  Basic Metabolic Panel:  Recent Labs Lab 03/26/17 0215 03/26/17 0327  NA 129*  --   K 5.4* 5.4*  CL 102  --   CO2 21*  --   GLUCOSE 285*  --   BUN 20  --   CREATININE 1.59*  --   CALCIUM 8.2*  --    Liver Function Tests:  Recent Labs Lab 03/26/17 0215  AST 31  ALT 30  ALKPHOS 108  BILITOT 0.5  PROT 5.1*  ALBUMIN 2.3*   No results for input(s): LIPASE, AMYLASE in the last 168 hours.  Recent Labs Lab 03/26/17 0215  AMMONIA 19   CBC:  Recent Labs Lab 03/26/17 0327  WBC 11.6*  NEUTROABS 8.2*  HGB 9.8*  HCT 30.6*  MCV 83.4  PLT 341   Cardiac Enzymes: No results for input(s): CKTOTAL, CKMB, CKMBINDEX, TROPONINI in the last 168 hours.  BNP (last 3 results) No results for input(s): BNP in the last 8760 hours.  ProBNP (last 3 results) No results for input(s): PROBNP in the last 8760 hours.   Serum creatinine: 1.59 mg/dL (H) 03/26/17 0215 Estimated creatinine clearance: 23.3 mL/min (A)  CBG: No results for input(s): GLUCAP in the last 168 hours.  Radiological Exams on Admission: Dg Chest 2 View  Result Date: 03/26/2017 CLINICAL DATA:  Altered mental status EXAM: CHEST  2 VIEW COMPARISON:  01/07/2017 FINDINGS: Tiny pleural effusions on the lateral view. No focal consolidation. Heart size upper normal. Aortic atherosclerosis. No pneumothorax. Osteopenia of the spine. IMPRESSION: Tiny pleural effusions.  No focal consolidation or edema. Electronically Signed   By: Donavan Foil M.D.   On: 03/26/2017 02:14   Dg Pelvis 1-2 Views  Result Date: 03/26/2017 CLINICAL DATA:  Altered mental status, pain EXAM:  PELVIS - 1-2 VIEW COMPARISON:  None. FINDINGS: Vascular calcifications. No acute osseous abnormality on the right. Sclerosis and fusion at the pubic symphysis. Suspect nondisplaced fracture involving the greater trochanter of the left femur. IMPRESSION: 1. Nondisplaced fracture of the greater trochanter of the left femur. 2. No dislocation. Electronically Signed   By: Donavan Foil M.D.   On: 03/26/2017 02:16   Ct Head Wo Contrast  Result Date: 03/26/2017 CLINICAL DATA:  Altered mental status EXAM: CT HEAD WITHOUT CONTRAST TECHNIQUE: Contiguous axial images were obtained from the base of the skull through the vertex without intravenous contrast. COMPARISON:  MRI 01/08/2017, CT head 01/06/2017  FINDINGS: Brain: No acute territorial infarction, hemorrhage or intracranial mass is seen. Old lacunar infarcts in the right basal ganglia and small old infarcts in the right cerebellum. Mild small vessel ischemic changes of the white matter. Stable atrophy. Stable ventricle size. Vascular: No hyperdense vessels. Vertebral artery and carotid artery calcification. Skull: Negative for fracture or suspicious bone lesion. Left mastoid sclerosis with minimal fluid. Sinuses/Orbits: No acute finding. Other: None IMPRESSION: 1. No CT evidence for acute intracranial abnormality. 2. Atrophy and small vessel ischemic changes of the white matter with old right basal ganglia and cerebellar infarcts Electronically Signed   By: Donavan Foil M.D.   On: 03/26/2017 01:49    EKG: Independently reviewed. No peaked T waves. NSR.  Assessment/Plan Principal Problem:   Acute encephalopathy Active Problems:   Seizure (HCC)   Uncontrolled hypertension  Acute encephalopathy UA neg CXR neg CT head neg Tramadol recently incr so will hold Will also hold  High K+ Give 2L NS in ED Check Mg & Phos Lasix x1 20mg   Low NA 129 on admit Will monitor Neuro checks IVF  L femur fracture WBAT by ortho per EDP PT eval and  tx  Seizures hx Prn ativan Seizure precautions  Hypertension When necessary hydralazine 10 mg IV as needed for severe blood pressure Cont coreg, plendil  Stroke Cont plavix  Hypothyroidism Cont OP synthroid 50 mcg qd No signs of hyper or hypothyroidism  Allergies Cont Claritin  CKD Cont bicarb Cr 1.59  Dementia Hold zyprexa  DM SSI AC   Code Status: FULL  DVT Prophylaxis: heparin Family Communication: no answer Disposition Plan: Pending Improvement  Status: tele obs  Elwin Mocha, MD Family Medicine [facility] [email or web address] Password [email or web address]

## 2017-03-27 DIAGNOSIS — G9340 Encephalopathy, unspecified: Secondary | ICD-10-CM | POA: Diagnosis not present

## 2017-03-27 DIAGNOSIS — G92 Toxic encephalopathy: Secondary | ICD-10-CM | POA: Diagnosis not present

## 2017-03-27 LAB — BASIC METABOLIC PANEL
ANION GAP: 6 (ref 5–15)
BUN: 16 mg/dL (ref 6–20)
CO2: 20 mmol/L — ABNORMAL LOW (ref 22–32)
Calcium: 7.7 mg/dL — ABNORMAL LOW (ref 8.9–10.3)
Chloride: 109 mmol/L (ref 101–111)
Creatinine, Ser: 1.44 mg/dL — ABNORMAL HIGH (ref 0.44–1.00)
GFR, EST AFRICAN AMERICAN: 39 mL/min — AB (ref >60)
GFR, EST NON AFRICAN AMERICAN: 33 mL/min — AB (ref >60)
Glucose, Bld: 321 mg/dL — ABNORMAL HIGH (ref 65–99)
POTASSIUM: 4.9 mmol/L (ref 3.5–5.1)
SODIUM: 135 mmol/L (ref 135–145)

## 2017-03-27 LAB — CBC
HEMATOCRIT: 30.9 % — AB (ref 36.0–46.0)
HEMOGLOBIN: 10 g/dL — AB (ref 12.0–15.0)
MCH: 27 pg (ref 26.0–34.0)
MCHC: 32.4 g/dL (ref 30.0–36.0)
MCV: 83.5 fL (ref 78.0–100.0)
Platelets: 343 10*3/uL (ref 150–400)
RBC: 3.7 MIL/uL — AB (ref 3.87–5.11)
RDW: 15.7 % — AB (ref 11.5–15.5)
WBC: 9.2 10*3/uL (ref 4.0–10.5)

## 2017-03-27 LAB — GLUCOSE, CAPILLARY: Glucose-Capillary: 297 mg/dL — ABNORMAL HIGH (ref 65–99)

## 2017-03-27 MED ORDER — HYDRALAZINE HCL 50 MG PO TABS: 50.0000 mg | ORAL_TABLET | Freq: Three times a day (TID) | ORAL | Status: DC

## 2017-03-27 MED ORDER — HYDRALAZINE HCL 50 MG PO TABS: 50.0000 mg | ORAL_TABLET | Freq: Three times a day (TID) | ORAL | 0 refills | Status: AC

## 2017-03-27 MED ORDER — AMLODIPINE BESYLATE 10 MG PO TABS: 10.0000 mg | ORAL_TABLET | Freq: Every day | ORAL | 0 refills | Status: DC

## 2017-03-27 MED ORDER — AMLODIPINE BESYLATE 10 MG PO TABS: 10.0000 mg | ORAL_TABLET | Freq: Every day | ORAL | Status: DC

## 2017-03-27 NOTE — Discharge Summary (Signed)
Ashley Velez CWU:[id number] DOB: 05/29/1935 DOA: 03/26/2017  PCP: Crist Infante, MD  Admit date: 03/26/2017  Discharge date: 03/27/2017  Admitted From: SNF   Disposition:  SNF   Recommendations for Outpatient Follow-up:   Follow up with PCP in 1-2 weeks  PCP Please obtain BMP/CBC, 2 view CXR in 1week,  (see Discharge instructions)   PCP Please follow up on the following pending results: None   Home Health: None   Equipment/Devices: Use walker as needed  Consultations: None Discharge Condition: Fair   CODE STATUS: Full   Diet Recommendation: Diet heart healthy/carb modified with feeding assistance and aspiration precautions  Chief Complaint  Patient presents with  . Altered Mental Status     Brief history of present illness from the day of admission and additional interim summary     Ashley Velez is a 82 y.o. female  pmhx  dm, htn, stroke, seizure. Of note pt has hx of falls. Recently fell and fractured hip. Her tramadol dose was increased. Soon after she became oriented to person only and incontinent of urine. SNF had pt sent to ed for eval. ED Course: Neg head CT. Electrolytes abnormal. Hospitalists consulted for admit.                                                                 Hospital Course    1. Toxic tramadol induced encephalopathy which has resolved. She is back to her baseline, awake alert oriented 2, no focal deficits, head CT, UA, chest x-ray negative, discontinue tramadol and discharge the patient back to SNF.  2. Upper attention. Somewhat in poor control, added hydralazine to home regimen.  3. History of falls and left femur fracture. Weightbearing as tolerated, continue PT, use walker and full fall precautions at SNF.  4. Dehydration with hyponatremia upon admission. Resolved  with IV fluids.  5. CK D3. At baseline.  6. Dyslipidemia. On statin continue.  7. DM type II. Continue sliding scale monitor CBGs before every meal at bedtime.   Discharge diagnosis     Principal Problem:   Acute encephalopathy Active Problems:   Seizure ([place])   Uncontrolled hypertension    Discharge instructions    Discharge Instructions    Diet - low sodium heart healthy    Complete by:  As directed    Discharge instructions    Complete by:  As directed    Follow with Primary MD Crist Infante, MD in 7 days   Get CBC, BMP checked  by SNF MD in 3-4 days    Activity: As tolerated with Full fall precautions use walker/cane & assistance as needed  Disposition SNF  Diet:   Diet heart healthy/carb modified with feeding assistance and aspiration precautions.  Accuchecks 4 times/day, Once in AM empty stomach and then before each meal.  Log in all results and show them to your Prim.MD in 3 days. If any glucose reading is under 80 or above 300 call your Prim MD immidiately. Follow Low glucose instructions for glucose under 80 as instructed.   For Heart failure patients - Check your Weight same time everyday, if you gain over 2 pounds, or you develop in leg swelling, experience more shortness of breath or chest pain, call your Primary MD immediately. Follow Cardiac Low Salt Diet and 1.5 lit/day fluid restriction.  On your next visit with your primary care physician please Get Medicines reviewed and adjusted.  Please request your Prim.MD to go over all Hospital Tests and Procedure/Radiological results at the follow up, please get all Hospital records sent to your Prim MD by signing hospital release before you go home.  If you experience worsening of your admission symptoms, develop shortness of breath, life threatening emergency, suicidal or homicidal thoughts you must seek medical attention immediately by calling 911 or calling your MD immediately  if symptoms less severe.  You  Must read complete instructions/literature along with all the possible adverse reactions/side effects for all the Medicines you take and that have been prescribed to you. Take any new Medicines after you have completely understood and accpet all the possible adverse reactions/side effects.   Do not drive, operate heavy machinery, perform activities at heights, swimming or participation in water activities or provide baby sitting services if your were admitted for syncope or siezures until you have seen by Primary MD or a Neurologist and advised to do so again.  Do not drive when taking Pain medications.    Do not take more than prescribed Pain, Sleep and Anxiety Medications  Special Instructions: If you have smoked or chewed Tobacco  in the last 2 yrs please stop smoking, stop any regular Alcohol  and or any Recreational drug use.  Wear Seat belts while driving.   Please note  You were cared for by a hospitalist during your hospital stay. If you have any questions about your discharge medications or the care you received while you were in the hospital after you are discharged, you can call the unit and asked to speak with the hospitalist on call if the hospitalist that took care of you is not available. Once you are discharged, your primary care physician will handle any further medical issues. Please note that NO REFILLS for any discharge medications will be authorized once you are discharged, as it is imperative that you return to your primary care physician (or establish a relationship with a primary care physician if you do not have one) for your aftercare needs so that they can reassess your need for medications and monitor your lab values.      Discharge Medications   Allergies as of 03/27/2017      Reactions   Fosamax [alendronate] Other (See Comments)   Noted to be an allergy on the patient's paperwork from facility, but no reaction is recorded   Macrodantin [nitrofurantoin] Other  (See Comments)   Noted to be an allergy on the patient's paperwork from facility, but no reaction is recorded      Medication List    STOP taking these medications   potassium chloride SA 20 MEQ tablet Commonly known as:  K-DUR,KLOR-CON   traMADol 50 MG tablet Commonly known as:  ULTRAM     TAKE these medications   acetaminophen 325 MG tablet Commonly known as:  TYLENOL Take 650 mg by mouth every 4 (  four) hours as needed for moderate pain.   acetaminophen 500 MG tablet Commonly known as:  TYLENOL Take 1,000 mg by mouth 3 (three) times daily.   CALCIUM 600 PO Take 600 mg by mouth 2 (two) times daily.   carvedilol 12.5 MG tablet Commonly known as:  COREG Take 12.5 mg by mouth 2 (two) times daily.   clopidogrel 75 MG tablet Commonly known as:  PLAVIX Take 75 mg by mouth daily.   cyanocobalamin 1000 MCG/ML injection Commonly known as:  (VITAMIN B-12) Inject 1,000 mcg into the muscle every 30 (thirty) days. ON THE 19TH OF THE MONTH   diclofenac sodium 1 % Gel Commonly known as:  VOLTAREN Apply 4 g topically 3 (three) times daily.   feeding supplement (PRO-STAT SUGAR FREE 64) Liqd Take 30 mLs by mouth daily.   felodipine 10 MG 24 hr tablet Commonly known as:  PLENDIL Take 1 tablet (10 mg total) by mouth daily.   HUMALOG 100 UNIT/ML injection Generic drug:  insulin lispro Inject 0-4 Units into the skin See admin instructions. Inject insulin twice daily with meals as follows: 0-59 = initiate hypoglycemic protocol, 60-250 = 0 units, 251-400 = 2 units, >400 = 4 units, recheck in 1 hour and notify MD/NP if remains >400   hydrALAZINE 50 MG tablet Commonly known as:  APRESOLINE Take 1 tablet (50 mg total) by mouth every 8 (eight) hours.   Iron 28 MG Tabs Take 28 mg by mouth daily.   LEVOXYL 50 MCG tablet Generic drug:  levothyroxine Take 50 mcg by mouth daily before breakfast.   loratadine 10 MG tablet Commonly known as:  CLARITIN Take 10 mg by mouth daily.     MULTIVITAMIN/MINERALS PO Take 1 tablet by mouth daily.   NON FORMULARY Take 90 mLs by mouth 2 (two) times daily. Medpass 2.0   OLANZapine 10 MG tablet Commonly known as:  ZYPREXA Take 10 mg by mouth at bedtime.   RESOURCE THICKENUP CLEAR Powd Take 120 g by mouth as needed (with all liquids).   simvastatin 20 MG tablet Commonly known as:  ZOCOR Take 20 mg by mouth at bedtime.   sodium bicarbonate 650 MG tablet Take 1 tablet (650 mg total) by mouth 2 (two) times daily.   Vitamin D (Ergocalciferol) 50000 units Caps capsule Commonly known as:  DRISDOL Take 50,000 Units by mouth every Wednesday.            Discharge Care Instructions        Start     Ordered   03/27/17 0000  Diet - low sodium heart healthy     03/27/17 1017   03/27/17 0000  Discharge instructions    Comments:  Follow with Primary MD Crist Infante, MD in 7 days   Get CBC, BMP checked  by SNF MD in 3-4 days    Activity: As tolerated with Full fall precautions use walker/cane & assistance as needed  Disposition SNF  Diet:   Diet heart healthy/carb modified with feeding assistance and aspiration precautions.  Accuchecks 4 times/day, Once in AM empty stomach and then before each meal. Log in all results and show them to your Prim.MD in 3 days. If any glucose reading is under 80 or above 300 call your Prim MD immidiately. Follow Low glucose instructions for glucose under 80 as instructed.   For Heart failure patients - Check your Weight same time everyday, if you gain over 2 pounds, or you develop in leg swelling, experience more shortness of  breath or chest pain, call your Primary MD immediately. Follow Cardiac Low Salt Diet and 1.5 lit/day fluid restriction.  On your next visit with your primary care physician please Get Medicines reviewed and adjusted.  Please request your Prim.MD to go over all Hospital Tests and Procedure/Radiological results at the follow up, please get all Hospital records sent  to your Prim MD by signing hospital release before you go home.  If you experience worsening of your admission symptoms, develop shortness of breath, life threatening emergency, suicidal or homicidal thoughts you must seek medical attention immediately by calling 911 or calling your MD immediately  if symptoms less severe.  You Must read complete instructions/literature along with all the possible adverse reactions/side effects for all the Medicines you take and that have been prescribed to you. Take any new Medicines after you have completely understood and accpet all the possible adverse reactions/side effects.   Do not drive, operate heavy machinery, perform activities at heights, swimming or participation in water activities or provide baby sitting services if your were admitted for syncope or siezures until you have seen by Primary MD or a Neurologist and advised to do so again.  Do not drive when taking Pain medications.    Do not take more than prescribed Pain, Sleep and Anxiety Medications  Special Instructions: If you have smoked or chewed Tobacco  in the last 2 yrs please stop smoking, stop any regular Alcohol  and or any Recreational drug use.  Wear Seat belts while driving.   Please note  You were cared for by a hospitalist during your hospital stay. If you have any questions about your discharge medications or the care you received while you were in the hospital after you are discharged, you can call the unit and asked to speak with the hospitalist on call if the hospitalist that took care of you is not available. Once you are discharged, your primary care physician will handle any further medical issues. Please note that NO REFILLS for any discharge medications will be authorized once you are discharged, as it is imperative that you return to your primary care physician (or establish a relationship with a primary care physician if you do not have one) for your aftercare needs so  that they can reassess your need for medications and monitor your lab values.   03/27/17 1017   03/27/17 0000  hydrALAZINE (APRESOLINE) 50 MG tablet  Every 8 hours     03/27/17 1020      Follow-up Information    Crist Infante, MD. Schedule an appointment as soon as possible for a visit in 1 week(s).   Specialty:  Internal Medicine Contact information: [address] [place] [address] [phone]           Major procedures and Radiology Reports - PLEASE review detailed and final reports thoroughly  -         Dg Chest 2 View  Result Date: 03/26/2017 CLINICAL DATA:  Altered mental status EXAM: CHEST  2 VIEW COMPARISON:  01/07/2017 FINDINGS: Tiny pleural effusions on the lateral view. No focal consolidation. Heart size upper normal. Aortic atherosclerosis. No pneumothorax. Osteopenia of the spine. IMPRESSION: Tiny pleural effusions.  No focal consolidation or edema. Electronically Signed   By: Donavan Foil M.D.   On: 03/26/2017 02:14   Dg Pelvis 1-2 Views  Result Date: 03/26/2017 CLINICAL DATA:  Altered mental status, pain EXAM: PELVIS - 1-2 VIEW COMPARISON:  None. FINDINGS: Vascular calcifications. No acute osseous abnormality  on the right. Sclerosis and fusion at the pubic symphysis. Suspect nondisplaced fracture involving the greater trochanter of the left femur. IMPRESSION: 1. Nondisplaced fracture of the greater trochanter of the left femur. 2. No dislocation. Electronically Signed   By: Donavan Foil M.D.   On: 03/26/2017 02:16   Ct Head Wo Contrast  Result Date: 03/26/2017 CLINICAL DATA:  Altered mental status EXAM: CT HEAD WITHOUT CONTRAST TECHNIQUE: Contiguous axial images were obtained from the base of the skull through the vertex without intravenous contrast. COMPARISON:  MRI 01/08/2017, CT head 01/06/2017 FINDINGS: Brain: No acute territorial infarction, hemorrhage or intracranial mass is seen. Old lacunar infarcts in the right basal ganglia and small old infarcts in  the right cerebellum. Mild small vessel ischemic changes of the white matter. Stable atrophy. Stable ventricle size. Vascular: No hyperdense vessels. Vertebral artery and carotid artery calcification. Skull: Negative for fracture or suspicious bone lesion. Left mastoid sclerosis with minimal fluid. Sinuses/Orbits: No acute finding. Other: None IMPRESSION: 1. No CT evidence for acute intracranial abnormality. 2. Atrophy and small vessel ischemic changes of the white matter with old right basal ganglia and cerebellar infarcts Electronically Signed   By: Donavan Foil M.D.   On: 03/26/2017 01:49    Micro Results     No results found for this or any previous visit (from the past 240 hour(s)).  Today   Subjective    Ashley Velez today has no headache,no chest abdominal pain,no new weakness tingling or numbness, feels much better wants to go home today.     Objective   Blood pressure (!) 180/63, pulse 68, temperature 98.1 F (36.7 C), temperature source Oral, resp. rate 18, height 5\' 3"  (1.6 m), weight 52.2 kg (115 lb), SpO2 100 %.   Intake/Output Summary (Last 24 hours) at 03/27/17 1020 Last data filed at 03/27/17 0136  Gross per 24 hour  Intake              240 ml  Output             1750 ml  Net            -1510 ml    Exam Awake Alert, Oriented x 2, No new F.N deficits, Normal affect [place].AT,PERRAL Supple Neck,No JVD, No cervical lymphadenopathy appriciated.  Symmetrical Chest wall movement, Good air movement bilaterally, CTAB RRR,No Gallops,Rubs or new Murmurs, No Parasternal Heave +ve B.Sounds, Abd Soft, Non tender, No organomegaly appriciated, No rebound -guarding or rigidity. No Cyanosis, Clubbing or edema, No new Rash or bruise   Data Review   CBC w Diff: Lab Results  Component Value Date   WBC 9.2 03/27/2017   HGB 10.0 (L) 03/27/2017   HCT 30.9 (L) 03/27/2017   PLT 343 03/27/2017   LYMPHOPCT 20 03/26/2017   MONOPCT 8 03/26/2017   EOSPCT 1 03/26/2017   BASOPCT 0  03/26/2017    CMP: Lab Results  Component Value Date   NA 135 03/27/2017   K 4.9 03/27/2017   CL 109 03/27/2017   CO2 20 (L) 03/27/2017   BUN 16 03/27/2017   CREATININE 1.44 (H) 03/27/2017   PROT 5.1 (L) 03/26/2017   ALBUMIN 2.3 (L) 03/26/2017   BILITOT 0.5 03/26/2017   ALKPHOS 108 03/26/2017   AST 31 03/26/2017   ALT 30 03/26/2017  .   Total Time in preparing paper work, data evaluation and todays exam - 25 minutes  Lala Lund M.D on 03/27/2017 at 10:20 AM  [facility]   Office  [phone]  

## 2017-03-27 NOTE — NC FL2 (Signed)
[place] MEDICAID FL2 LEVEL OF CARE SCREENING TOOL     IDENTIFICATION  Patient Name: Ashley Velez Birthdate: 02-Dec-1934 Sex: female Admission Date (Current Location): 03/26/2017  [facility] and [place] Number:  Herbalist and Address:  [facility], [place] [address], [place], [place] [address]      Provider Number: [id number]  Attending Physician Name and Address:  Thurnell Lose, MD  Relative Name and Phone Number:  Leonel Ramsay [phone]/ husband Gwyndolyn Kaufman [phone]    Current Level of Care: Hospital Recommended Level of Care: [place] Prior Approval Number:    Date Approved/Denied: 03/26/17 PASRR Number: [id number]  Discharge Plan: SNF    Current Diagnoses: Patient Active Problem List   Diagnosis Date Noted  . Acute encephalopathy 03/26/2017  . Hypoglycemia 01/12/2017  . Status epilepticus ([place]) 01/12/2017  . AKI (acute kidney injury) ([place]) 01/12/2017  . Aspiration pneumonia ([place]) 01/12/2017  . Uncontrolled hypertension 01/12/2017  . Acute respiratory failure with hypoxia ([place])   . Seizure ([place]) 01/06/2017    Orientation RESPIRATION BLADDER Height & Weight     Self, Situation, Place  Normal Incontinent Weight: 115 lb (52.2 kg) Height:  5\' 3"  (160 cm)  BEHAVIORAL SYMPTOMS/MOOD NEUROLOGICAL BOWEL NUTRITION STATUS      Continent Diet (See DC summary)  AMBULATORY STATUS COMMUNICATION OF NEEDS Skin   Extensive Assist Verbally                         Personal Care Assistance Level of Assistance  Bathing, Feeding, Dressing Bathing Assistance: Limited assistance Feeding assistance: Independent Dressing Assistance: Limited assistance     Functional Limitations Info  Sight, Hearing, Speech Sight Info: Adequate Hearing Info: Adequate Speech Info: Adequate    SPECIAL CARE FACTORS FREQUENCY                       Contractures      Additional Factors Info  Code Status, Allergies,  Insulin Sliding Scale Code Status Info: Full Code Allergies Info: FOSAMAX ALENDRONATE, MACRODANTIN NITROFURANTOIN    Insulin Sliding Scale Info: Insulin Daily       Current Medications (03/27/2017):  This is the current hospital active medication list Current Facility-Administered Medications  Medication Dose Route Frequency Provider Last Rate Last Dose  . acetaminophen (TYLENOL) tablet 650 mg  650 mg Oral Q6H PRN Elwin Mocha, MD   650 mg at 03/27/17 6644   Or  . acetaminophen (TYLENOL) suppository 650 mg  650 mg Rectal Q6H PRN Elwin Mocha, MD      . carvedilol (COREG) tablet 12.5 mg  12.5 mg Oral BID WC Elwin Mocha, MD   12.5 mg at 03/27/17 0347  . clopidogrel (PLAVIX) tablet 75 mg  75 mg Oral Daily Elwin Mocha, MD   75 mg at 03/27/17 4259  . heparin injection 5,000 Units  5,000 Units Subcutaneous Q8H Elwin Mocha, MD   5,000 Units at 03/27/17 5638  . hydrALAZINE (APRESOLINE) injection 10 mg  10 mg Intravenous Q8H PRN Elwin Mocha, MD      . hydrALAZINE (APRESOLINE) tablet 50 mg  50 mg Oral Q8H Singh, Deno Etienne K, MD      . insulin aspart (novoLOG) injection 0-15 Units  0-15 Units Subcutaneous TID WC Elwin Mocha, MD   8 Units at 03/26/17 1759  . levothyroxine (SYNTHROID, LEVOTHROID) tablet 50 mcg  50 mcg Oral QAC breakfast Benjaman Lobe  M, MD   50 mcg at 03/27/17 0903  . LORazepam (ATIVAN) injection 1-2 mg  1-2 mg Intravenous Q6H PRN Elwin Mocha, MD      . OLANZapine [facility]) tablet 10 mg  10 mg Oral QHS Elwin Mocha, MD   10 mg at 03/26/17 2125  . ondansetron (ZOFRAN) tablet 4 mg  4 mg Oral Q6H PRN Elwin Mocha, MD       Or  . ondansetron [facility]) injection 4 mg  4 mg Intravenous Q6H PRN Elwin Mocha, MD      . potassium chloride SA (K-DUR,KLOR-CON) CR tablet 20 mEq  20 mEq Oral Daily Elwin Mocha, MD   20 mEq at 03/27/17 2263  . simvastatin (ZOCOR) tablet 20 mg  20 mg Oral QHS Elwin Mocha, MD   20 mg at 03/26/17 2125      Discharge Medications: Please see discharge summary for a list of discharge medications.  Relevant Imaging Results:  Relevant Lab Results:   Additional Information [id number] [place], [place]

## 2017-03-27 NOTE — Discharge Instructions (Signed)
Follow with Primary MD Crist Infante, MD in 7 days   Get CBC, BMP checked  by SNF MD in 3-4 days    Activity: As tolerated with Full fall precautions use walker/cane & assistance as needed  DispositionSNF  Diet:   Diet heart healthy/carb modified with feeding assistance and aspiration precautions.  For Heart failure patients - Check your Weight same time everyday, if you gain over 2 pounds, or you develop in leg swelling, experience more shortness of breath or chest pain, call your Primary MD immediately. Follow Cardiac Low Salt Diet and 1.5 lit/day fluid restriction.  On your next visit with your primary care physician please Get Medicines reviewed and adjusted.  Please request your Prim.MD to go over all Hospital Tests and Procedure/Radiological results at the follow up, please get all Hospital records sent to your Prim MD by signing hospital release before you go home.  If you experience worsening of your admission symptoms, develop shortness of breath, life threatening emergency, suicidal or homicidal thoughts you must seek medical attention immediately by calling 911 or calling your MD immediately  if symptoms less severe.  You Must read complete instructions/literature along with all the possible adverse reactions/side effects for all the Medicines you take and that have been prescribed to you. Take any new Medicines after you have completely understood and accpet all the possible adverse reactions/side effects.   Do not drive, operate heavy machinery, perform activities at heights, swimming or participation in water activities or provide baby sitting services if your were admitted for syncope or siezures until you have seen by Primary MD or a Neurologist and advised to do so again.  Do not drive when taking Pain medications.    Do not take more than prescribed Pain, Sleep and Anxiety Medications  Special Instructions: If you have smoked or chewed Tobacco  in the last 2 yrs please  stop smoking, stop any regular Alcohol  and or any Recreational drug use.  Wear Seat belts while driving.   Please note  You were cared for by a hospitalist during your hospital stay. If you have any questions about your discharge medications or the care you received while you were in the hospital after you are discharged, you can call the unit and asked to speak with the hospitalist on call if the hospitalist that took care of you is not available. Once you are discharged, your primary care physician will handle any further medical issues. Please note that NO REFILLS for any discharge medications will be authorized once you are discharged, as it is imperative that you return to your primary care physician (or establish a relationship with a primary care physician if you do not have one) for your aftercare needs so that they can reassess your need for medications and monitor your lab values.

## 2017-03-27 NOTE — Care Management Note (Signed)
Case Management Note  Patient Details  Name: Ashley Velez MRN: [id number] Date of Birth: March 27, 1935  Subjective/Objective:                 Patient with order to discharge to [facility] (SNF). SNF discharge facilitated through [place] ([place]). Please refer to [place] notes for disposition plan and direct questions accordingly. Carles Collet RN [employer] [phone].    Action/Plan:   Expected Discharge Date:  03/27/17               Expected Discharge Plan:  Skilled Nursing Facility  In-House Referral:  Clinical Social Work  Discharge planning Services  CM Consult  Post Acute Care Choice:    Choice offered to:     DME Arranged:    DME Agency:     HH Arranged:    [place] Agency:     Status of Service:  Completed, signed off  If discussed at [employer], dates discussed:    Additional Comments:  Carles Collet, RN 03/27/2017, 10:33 AM

## 2017-03-27 NOTE — Clinical Social Work Note (Signed)
Clinical Social Worker facilitated patient discharge including contacting patient family and facility to confirm patient discharge plans.  Clinical information faxed to facility and family agreeable with plan.  CSW arranged ambulance transport via [facility] to [facility].  RN to call report prior to discharge.  Clinical Social Worker will sign off for now as social work intervention is no longer needed. Please consult us again if new need arises.  Jesse Elyanna Wallick, LCSW [phone] 

## 2017-03-28 NOTE — Progress Notes (Signed)
PT Note - Late G Code Entry    04/02/2017 1502  PT G-Codes **NOT FOR INPATIENT CLASS**  Functional Assessment Tool Used AM-PAC 6 Clicks Basic Mobility  Functional Limitation Mobility: Walking and moving around  Mobility: Walking and Moving Around Current Status (D4709) CL  Mobility: Walking and Moving Around Goal Status [phone]) Layla Maw [place]

## 2017-03-30 ENCOUNTER — Encounter (HOSPITAL_COMMUNITY): Payer: Self-pay

## 2017-03-30 ENCOUNTER — Emergency Department (HOSPITAL_COMMUNITY)
Admission: EM | Admit: 2017-03-30 | Discharge: 2017-03-30 | Disposition: A | Discharge status: Home / Self Care | Payer: Medicare Other | Attending: Emergency Medicine | Admitting: Emergency Medicine

## 2017-03-30 ENCOUNTER — Emergency Department (HOSPITAL_COMMUNITY): Payer: Medicare Other

## 2017-03-30 DIAGNOSIS — Y939 Activity, unspecified: Secondary | ICD-10-CM | POA: Insufficient documentation

## 2017-03-30 DIAGNOSIS — Y929 Unspecified place or not applicable: Secondary | ICD-10-CM | POA: Insufficient documentation

## 2017-03-30 DIAGNOSIS — Z794 Long term (current) use of insulin: Secondary | ICD-10-CM | POA: Diagnosis not present

## 2017-03-30 DIAGNOSIS — S79922A Unspecified injury of left thigh, initial encounter: Secondary | ICD-10-CM | POA: Diagnosis present

## 2017-03-30 DIAGNOSIS — Z79899 Other long term (current) drug therapy: Secondary | ICD-10-CM | POA: Insufficient documentation

## 2017-03-30 DIAGNOSIS — Y999 Unspecified external cause status: Secondary | ICD-10-CM | POA: Insufficient documentation

## 2017-03-30 DIAGNOSIS — W19XXXA Unspecified fall, initial encounter: Secondary | ICD-10-CM | POA: Insufficient documentation

## 2017-03-30 DIAGNOSIS — S72115A Nondisplaced fracture of greater trochanter of left femur, initial encounter for closed fracture: Secondary | ICD-10-CM | POA: Insufficient documentation

## 2017-03-30 DIAGNOSIS — Z7902 Long term (current) use of antithrombotics/antiplatelets: Secondary | ICD-10-CM | POA: Insufficient documentation

## 2017-03-30 DIAGNOSIS — I10 Essential (primary) hypertension: Secondary | ICD-10-CM | POA: Diagnosis not present

## 2017-03-30 DIAGNOSIS — E119 Type 2 diabetes mellitus without complications: Secondary | ICD-10-CM | POA: Diagnosis not present

## 2017-03-30 LAB — CBG MONITORING, ED: GLUCOSE-CAPILLARY: 237 mg/dL — AB (ref 65–99)

## 2017-03-30 NOTE — ED Provider Notes (Signed)
[facility] DEPT Provider Note   CSN: [id number] Arrival date & time: 03/30/17  1754     History   Chief Complaint No chief complaint on file.   HPI Ashley Velez is a 82 y.o. female.  Patient is an 82 year old female with a history of diabetes, hypertension, stroke, seizurewho was recently admitted and discharged after encephalopathy, fall and left femur fracture. Patient was sent today for further evaluation.  Today patient was sent from nursing facility because her daughter requested repeat evaluation. Based on hospital discharge notes patient was deemed to have a femur fracture that was weightbearing as tolerated and PT. On exam patient is smiling will answer some questions is slightly confused but denies any significant pain at this time. There is no reported new falls   The history is provided by the patient and the nursing home.    Past Medical History:  Diagnosis Date  . Acute respiratory failure ([place]) 09/2016  . Diabetes mellitus without complication ([place])   . Epilepsy ([place])   . Hypertension   . Stroke [facility])     Patient Active Problem List   Diagnosis Date Noted  . Acute encephalopathy 03/26/2017  . Hypoglycemia 01/12/2017  . Status epilepticus ([place]) 01/12/2017  . AKI (acute kidney injury) ([place]) 01/12/2017  . Aspiration pneumonia ([place]) 01/12/2017  . Uncontrolled hypertension 01/12/2017  . Acute respiratory failure with hypoxia ([place])   . Seizure ([place]) 01/06/2017    History reviewed. No pertinent surgical history.  OB History    No data available       Home Medications    Prior to Admission medications   Medication Sig Start Date End Date Taking? Authorizing Provider  acetaminophen (TYLENOL) 325 MG tablet Take 650 mg by mouth every 4 (four) hours as needed for moderate pain.    [provider]  acetaminophen (TYLENOL) 500 MG tablet Take 1,000 mg by mouth 3 (three) times daily.    [provider]  Amino Acids-Protein Hydrolys  (FEEDING SUPPLEMENT, PRO-STAT SUGAR FREE 64,) LIQD Take 30 mLs by mouth daily.    [provider]  Calcium Carbonate (CALCIUM 600 PO) Take 600 mg by mouth 2 (two) times daily.    [provider]  carvedilol (COREG) 12.5 MG tablet Take 12.5 mg by mouth 2 (two) times daily.  08/14/15   [provider]  clopidogrel (PLAVIX) 75 MG tablet Take 75 mg by mouth daily.  08/22/15   [provider]  cyanocobalamin (,VITAMIN B-12,) 1000 MCG/ML injection Inject 1,000 mcg into the muscle every 30 (thirty) days. ON THE 19TH OF THE MONTH    [provider]  diclofenac sodium (VOLTAREN) 1 % GEL Apply 4 g topically 3 (three) times daily.    [provider]  felodipine (PLENDIL) 10 MG 24 hr tablet Take 1 tablet (10 mg total) by mouth daily. 01/12/17   Dhungel, Flonnie Overman, MD  Ferrous Sulfate (IRON) 28 MG TABS Take 28 mg by mouth daily.    [provider]  hydrALAZINE (APRESOLINE) 50 MG tablet Take 1 tablet (50 mg total) by mouth every 8 (eight) hours. 03/27/17   Thurnell Lose, MD  insulin lispro (HUMALOG) 100 UNIT/ML injection Inject 0-4 Units into the skin See admin instructions. Inject insulin twice daily with meals as follows: 0-59 = initiate hypoglycemic protocol, 60-250 = 0 units, 251-400 = 2 units, >400 = 4 units, recheck in 1 hour and notify MD/NP if remains >400    [provider]  levothyroxine (LEVOXYL) 50 MCG tablet  Take 50 mcg by mouth daily before breakfast.    [provider]  loratadine (CLARITIN) 10 MG tablet Take 10 mg by mouth daily.    [provider]  Maltodextrin-Xanthan Gum (RESOURCE THICKENUP CLEAR) POWD Take 120 g by mouth as needed (with all liquids). Patient not taking: Reported on 03/26/2017 01/12/17   Dhungel, Flonnie Overman, MD  NON FORMULARY Take 90 mLs by mouth 2 (two) times daily. Medpass 2.0    [provider]  OLANZapine (ZYPREXA) 10 MG tablet Take 10 mg by mouth at bedtime.  08/14/15   [provider]  Prenat-FeCbn-FeBisg-FA-Omega (MULTIVITAMIN/MINERALS PO) Take 1 tablet by mouth daily.    [provider]  simvastatin (ZOCOR) 20 MG tablet Take 20 mg by mouth at bedtime. 08/10/15   [provider]  sodium bicarbonate 650 MG tablet Take 1 tablet (650 mg total) by mouth 2 (two) times daily. Patient not taking: Reported on 03/26/2017 01/12/17 01/12/18  Dhungel, Flonnie Overman, MD  Vitamin D, Ergocalciferol, (DRISDOL) 50000 UNITS CAPS capsule Take 50,000 Units by mouth every Wednesday.  12/31/14   [provider]    Family History No family history on file.  Social History Social History  Substance Use Topics  . Smoking status: Never Smoker  . Smokeless tobacco: Never Used  . Alcohol use No     Allergies   Fosamax [alendronate] and Macrodantin [nitrofurantoin]   Review of Systems Review of Systems  All other systems reviewed and are negative.    Physical Exam Updated Vital Signs BP (!) 142/83 (BP Location: Left Arm)   Pulse 82   Temp 98.7 F (37.1 C) (Oral)   Resp 16   Ht 5\' 3"  (1.6 m)   Wt 51.3 kg (113 lb)   SpO2 97%   BMI 20.02 kg/m   Physical Exam  Constitutional: She appears well-developed and well-nourished. No distress.  HENT:  Head: Normocephalic and atraumatic.  Mouth/Throat: Oropharynx is clear and moist.  Eyes: Pupils are equal, round, and reactive to light. Conjunctivae and EOM are normal.  Neck: Normal range of motion. Neck supple.  Cardiovascular: Normal rate, regular rhythm and intact distal pulses.   No murmur heard. Pulmonary/Chest: Effort normal and breath sounds normal. No respiratory distress. She has no wheezes. She has no rales.  Abdominal: Soft. She exhibits no distension. There is no tenderness. There is no rebound and no guarding.  Musculoskeletal: Normal range of motion. She exhibits no edema or tenderness.  No tenderness with palpation of the hip or femur. Able to completely range the hip in all directions and  5 out of 5 strength in bilateral lower extremities. No pain with internal and sternal rotation of the left hip.  No lower extremity edema  Neurological: She is alert.  Oriented 2. Very pleasant  Skin: Skin is warm and dry. No rash noted. No erythema.  Psychiatric: She has a normal mood and affect. Her behavior is normal.  Nursing note and vitals reviewed.    ED Treatments / Results  Labs (all labs ordered are listed, but only abnormal results are displayed) Labs Reviewed  CBG MONITORING, ED - Abnormal; Notable for the following:       Result Value   Glucose-Capillary 237 (*)    All other components within normal limits    EKG  EKG Interpretation None       Radiology Ct Hip Left Wo Contrast  Result Date: 03/30/2017 CLINICAL DATA:  Pain following fall EXAM: CT OF THE LEFT HIP WITHOUT CONTRAST  TECHNIQUE: Multidetector CT imaging of the left hip was performed according to the standard protocol. Multiplanar CT image reconstructions were also generated. COMPARISON:  Pelvis radiograph March 26, 2017 FINDINGS: Bones/Joint/Cartilage There is a fracture along the greater trochanter of the left proximal femur with alignment near anatomic. No other fracture is appreciable. No dislocation. There is mild narrowing of the left hip joint. Ligaments Suboptimally assessed by CT. Muscles and Tendons No intramuscular lesion appreciable. In particular, there is no demonstrable intramuscular hematoma. Soft tissues Soft tissue stranding is noted lateral to the greater trochanter in the lateral proximal thigh region. There is subcutaneous thickening consistent with recent trauma. There are foci of atherosclerotic calcification in the left common femoral, proximal superficial femoral, and proximal profunda femoral arteries. No aneurysm is seen in these vessels. Visualized pelvis shows a portion of the urinary bladder with normal wall thickness. No mass or adenopathy is noted in visualized regions. IMPRESSION:  Fracture of the greater trochanter of the proximal left femur with alignment near anatomic. No other fracture. No dislocation. Soft tissue stranding in the lateral proximal left thigh. No well-defined hematoma in the soft tissues. There is atherosclerotic calcification in the common femoral, profunda femoral, and superficial femoral arteries. Electronically Signed   By: Lowella Grip III M.D.   On: 03/30/2017 19:10    Procedures Procedures (including critical care time)  Medications Ordered in ED Medications - No data to display   Initial Impression / Assessment and Plan / ED Course  I have reviewed the triage vital signs and the nursing notes.  Pertinent labs & imaging results that were available during my care of the patient were reviewed by me and considered in my medical decision making (see chart for details).     Patient presenting from facility for repeat evaluation per daughter. Patient had a fall on Thursday and was evaluated in the [facility] emergency room and admitted for hip fracture and encephalopathy. Encephalopathy was thought to be from increased tramadol dosing and fracture was deemed to be weightbearing as tolerated and PT. Unclear why daughter wants patient to be reevaluated today however she is on her way. Patient has no complaints at this time. Noimpressive pain on exam and unable to reproduce pain with rotation of the hip. Patient is slightly confused but will discuss with daughter whether this is her baseline. Blood sugar is in the 200s.  7:54 PM CT shows no other fractures of the hip except for the greater trochanteric fracture of the femur. This is a weightbearing as tolerated injury with physical therapy. Patient remains in no acute distress and is comfortable. Attempted to call patient's daughter Ardelle Park but nobody answered the phone. Spoke with Miquel Dunn placed and relayed this information to her nurse and they will be sure to start the patient with physical  therapy and states that was never relayed to them upon discharge  Final Clinical Impressions(s) / ED Diagnoses   Final diagnoses:  Closed nondisplaced fracture of greater trochanter of left femur, initial encounter [facility])    New Prescriptions New Prescriptions   No medications on file     Blanchie Dessert, MD 03/30/17 1958

## 2017-03-30 NOTE — ED Triage Notes (Signed)
Patient present to the ed by via ems with c/o left leg pain. [facility] went to [facility] Thursday after she fell out of the bed. Pt was evaluated at [facility] and sent back to [facility]. Pt here today because daughter want her evaluated again. Pt is alert and oriented x 2 but acting confused.

## 2017-03-30 NOTE — ED Notes (Signed)
[facility] call for transport.

## 2017-03-30 NOTE — ED Notes (Signed)
Bed: UX32 Expected date:  Expected time:  Means of arrival:  Comments: EMS- Hip Fx

## 2017-03-30 NOTE — Discharge Instructions (Signed)
Her fracture is a stable fracture.  Weightbearing as tolerated, continue PT, use walker and full fall precautions at facility.

## 2017-03-30 NOTE — ED Notes (Signed)
attempted to call report to [facility] x 2 and never got anyone to give report too.

## 2017-09-22 ENCOUNTER — Ambulatory Visit (INDEPENDENT_AMBULATORY_CARE_PROVIDER_SITE_OTHER): Payer: Medicare Other | Admitting: Podiatry

## 2017-09-22 ENCOUNTER — Encounter: Payer: Self-pay | Admitting: Podiatry

## 2017-09-22 DIAGNOSIS — M79674 Pain in right toe(s): Secondary | ICD-10-CM | POA: Diagnosis not present

## 2017-09-22 DIAGNOSIS — B351 Tinea unguium: Secondary | ICD-10-CM | POA: Diagnosis not present

## 2017-09-22 DIAGNOSIS — M79675 Pain in left toe(s): Secondary | ICD-10-CM | POA: Diagnosis not present

## 2017-09-22 NOTE — Progress Notes (Signed)
Subjective:   Patient ID: Ashley Velez, female   DOB: 83 y.o.   MRN: [id number]   HPI Poor health individual in wheelchair with severe nail disease bilateral with incurvation   ROS      Objective:  Physical Exam  Neurovascular status unchanged with thick yellow brittle nailbeds 1-5 both feet     Assessment:  Chronic mycotic nail infection 1-5 both feet     Plan:  Debride painful nailbeds 1-5 both feet with no iatrogenic bleeding noted

## 2019-03-27 ENCOUNTER — Emergency Department (HOSPITAL_COMMUNITY): Payer: Medicare Other

## 2019-03-27 ENCOUNTER — Inpatient Hospital Stay (HOSPITAL_COMMUNITY): Payer: Medicare Other

## 2019-03-27 ENCOUNTER — Other Ambulatory Visit: Payer: Self-pay

## 2019-03-27 ENCOUNTER — Encounter (HOSPITAL_COMMUNITY): Payer: Self-pay

## 2019-03-27 DIAGNOSIS — R627 Adult failure to thrive: Secondary | ICD-10-CM

## 2019-03-27 DIAGNOSIS — Z7984 Long term (current) use of oral hypoglycemic drugs: Secondary | ICD-10-CM

## 2019-03-27 DIAGNOSIS — R296 Repeated falls: Secondary | ICD-10-CM | POA: Diagnosis present

## 2019-03-27 DIAGNOSIS — E875 Hyperkalemia: Secondary | ICD-10-CM | POA: Diagnosis present

## 2019-03-27 DIAGNOSIS — F259 Schizoaffective disorder, unspecified: Secondary | ICD-10-CM | POA: Diagnosis present

## 2019-03-27 DIAGNOSIS — R0989 Other specified symptoms and signs involving the circulatory and respiratory systems: Secondary | ICD-10-CM

## 2019-03-27 DIAGNOSIS — Z79899 Other long term (current) drug therapy: Secondary | ICD-10-CM

## 2019-03-27 DIAGNOSIS — E785 Hyperlipidemia, unspecified: Secondary | ICD-10-CM | POA: Diagnosis present

## 2019-03-27 DIAGNOSIS — Z515 Encounter for palliative care: Secondary | ICD-10-CM

## 2019-03-27 DIAGNOSIS — N390 Urinary tract infection, site not specified: Secondary | ICD-10-CM | POA: Diagnosis present

## 2019-03-27 DIAGNOSIS — G40909 Epilepsy, unspecified, not intractable, without status epilepticus: Secondary | ICD-10-CM | POA: Diagnosis present

## 2019-03-27 DIAGNOSIS — E559 Vitamin D deficiency, unspecified: Secondary | ICD-10-CM | POA: Diagnosis present

## 2019-03-27 DIAGNOSIS — Z7189 Other specified counseling: Secondary | ICD-10-CM

## 2019-03-27 DIAGNOSIS — J189 Pneumonia, unspecified organism: Secondary | ICD-10-CM | POA: Diagnosis present

## 2019-03-27 DIAGNOSIS — M7989 Other specified soft tissue disorders: Secondary | ICD-10-CM | POA: Diagnosis not present

## 2019-03-27 DIAGNOSIS — Z20828 Contact with and (suspected) exposure to other viral communicable diseases: Secondary | ICD-10-CM | POA: Diagnosis present

## 2019-03-27 DIAGNOSIS — B356 Tinea cruris: Secondary | ICD-10-CM | POA: Diagnosis present

## 2019-03-27 DIAGNOSIS — N184 Chronic kidney disease, stage 4 (severe): Secondary | ICD-10-CM | POA: Diagnosis present

## 2019-03-27 DIAGNOSIS — J9601 Acute respiratory failure with hypoxia: Secondary | ICD-10-CM | POA: Diagnosis present

## 2019-03-27 DIAGNOSIS — J9621 Acute and chronic respiratory failure with hypoxia: Secondary | ICD-10-CM | POA: Diagnosis present

## 2019-03-27 DIAGNOSIS — Z8673 Personal history of transient ischemic attack (TIA), and cerebral infarction without residual deficits: Secondary | ICD-10-CM

## 2019-03-27 DIAGNOSIS — Z6820 Body mass index (BMI) 20.0-20.9, adult: Secondary | ICD-10-CM

## 2019-03-27 DIAGNOSIS — Z23 Encounter for immunization: Secondary | ICD-10-CM

## 2019-03-27 DIAGNOSIS — I340 Nonrheumatic mitral (valve) insufficiency: Secondary | ICD-10-CM | POA: Diagnosis not present

## 2019-03-27 DIAGNOSIS — I5021 Acute systolic (congestive) heart failure: Secondary | ICD-10-CM

## 2019-03-27 DIAGNOSIS — I214 Non-ST elevation (NSTEMI) myocardial infarction: Secondary | ICD-10-CM | POA: Diagnosis present

## 2019-03-27 DIAGNOSIS — I82621 Acute embolism and thrombosis of deep veins of right upper extremity: Secondary | ICD-10-CM | POA: Diagnosis not present

## 2019-03-27 DIAGNOSIS — I160 Hypertensive urgency: Secondary | ICD-10-CM | POA: Diagnosis present

## 2019-03-27 DIAGNOSIS — D631 Anemia in chronic kidney disease: Secondary | ICD-10-CM | POA: Diagnosis present

## 2019-03-27 DIAGNOSIS — E861 Hypovolemia: Secondary | ICD-10-CM | POA: Diagnosis not present

## 2019-03-27 DIAGNOSIS — E1122 Type 2 diabetes mellitus with diabetic chronic kidney disease: Secondary | ICD-10-CM | POA: Diagnosis present

## 2019-03-27 DIAGNOSIS — N179 Acute kidney failure, unspecified: Secondary | ICD-10-CM | POA: Diagnosis present

## 2019-03-27 DIAGNOSIS — T502X5A Adverse effect of carbonic-anhydrase inhibitors, benzothiadiazides and other diuretics, initial encounter: Secondary | ICD-10-CM | POA: Diagnosis not present

## 2019-03-27 DIAGNOSIS — I130 Hypertensive heart and chronic kidney disease with heart failure and stage 1 through stage 4 chronic kidney disease, or unspecified chronic kidney disease: Secondary | ICD-10-CM | POA: Diagnosis present

## 2019-03-27 DIAGNOSIS — E872 Acidosis: Secondary | ICD-10-CM | POA: Diagnosis present

## 2019-03-27 DIAGNOSIS — Z66 Do not resuscitate: Secondary | ICD-10-CM | POA: Diagnosis present

## 2019-03-27 DIAGNOSIS — I5023 Acute on chronic systolic (congestive) heart failure: Secondary | ICD-10-CM | POA: Diagnosis present

## 2019-03-27 DIAGNOSIS — I471 Supraventricular tachycardia: Secondary | ICD-10-CM | POA: Diagnosis not present

## 2019-03-27 DIAGNOSIS — I255 Ischemic cardiomyopathy: Secondary | ICD-10-CM | POA: Diagnosis present

## 2019-03-27 DIAGNOSIS — R0602 Shortness of breath: Secondary | ICD-10-CM

## 2019-03-27 DIAGNOSIS — F0390 Unspecified dementia without behavioral disturbance: Secondary | ICD-10-CM | POA: Diagnosis present

## 2019-03-27 DIAGNOSIS — Z888 Allergy status to other drugs, medicaments and biological substances status: Secondary | ICD-10-CM

## 2019-03-27 DIAGNOSIS — Z7902 Long term (current) use of antithrombotics/antiplatelets: Secondary | ICD-10-CM

## 2019-03-27 DIAGNOSIS — E039 Hypothyroidism, unspecified: Secondary | ICD-10-CM | POA: Diagnosis present

## 2019-03-27 DIAGNOSIS — B379 Candidiasis, unspecified: Secondary | ICD-10-CM | POA: Diagnosis present

## 2019-03-27 HISTORY — DX: Vitamin D deficiency, unspecified: E55.9

## 2019-03-27 HISTORY — DX: Schizoaffective disorder, unspecified: F25.9

## 2019-03-27 HISTORY — DX: Disorder of thyroid, unspecified: E07.9

## 2019-03-27 HISTORY — DX: Hyperlipidemia, unspecified: E78.5

## 2019-03-27 HISTORY — DX: Disorder of kidney and ureter, unspecified: N28.9

## 2019-03-27 LAB — BLOOD GAS, ARTERIAL
Bicarbonate: 15.5 mmol/L — ABNORMAL LOW (ref 20.0–28.0)
Drawn by: 295031
O2 Content: 2 L/min
O2 Saturation: 96.3 %
Patient temperature: 98.6
pCO2 arterial: 28.9 mmHg — ABNORMAL LOW (ref 32.0–48.0)
pH, Arterial: 7.348 — ABNORMAL LOW (ref 7.350–7.450)
pO2, Arterial: 87 mmHg (ref 83.0–108.0)

## 2019-03-27 LAB — BASIC METABOLIC PANEL
Anion gap: 12 (ref 5–15)
Anion gap: 14 (ref 5–15)
BUN: 40 mg/dL — ABNORMAL HIGH (ref 8–23)
BUN: 40 mg/dL — ABNORMAL HIGH (ref 8–23)
CO2: 16 mmol/L — ABNORMAL LOW (ref 22–32)
CO2: 14 mmol/L — ABNORMAL LOW (ref 22–32)
Calcium: 8.9 mg/dL (ref 8.9–10.3)
Calcium: 8.6 mg/dL — ABNORMAL LOW (ref 8.9–10.3)
Chloride: 108 mmol/L (ref 98–111)
Chloride: 109 mmol/L (ref 98–111)
Creatinine, Ser: 2.58 mg/dL — ABNORMAL HIGH (ref 0.44–1.00)
Creatinine, Ser: 2.59 mg/dL — ABNORMAL HIGH (ref 0.44–1.00)
GFR calc Af Amer: 19 mL/min — ABNORMAL LOW (ref >60)
GFR calc Af Amer: 19 mL/min — ABNORMAL LOW (ref >60)
GFR calc non Af Amer: 17 mL/min — ABNORMAL LOW (ref >60)
GFR calc non Af Amer: 17 mL/min — ABNORMAL LOW (ref >60)
Glucose, Bld: 212 mg/dL — ABNORMAL HIGH (ref 70–99)
Glucose, Bld: 176 mg/dL — ABNORMAL HIGH (ref 70–99)
Potassium: 5.5 mmol/L — ABNORMAL HIGH (ref 3.5–5.1)
Potassium: 5.5 mmol/L — ABNORMAL HIGH (ref 3.5–5.1)
Sodium: 136 mmol/L (ref 135–145)
Sodium: 137 mmol/L (ref 135–145)

## 2019-03-27 LAB — BRAIN NATRIURETIC PEPTIDE: B Natriuretic Peptide: 1026.3 pg/mL — ABNORMAL HIGH (ref 0.0–100.0)

## 2019-03-27 LAB — CBC WITH DIFFERENTIAL/PLATELET
Abs Immature Granulocytes: 0.09 10*3/uL — ABNORMAL HIGH (ref 0.00–0.07)
Basophils Absolute: 0 10*3/uL (ref 0.0–0.1)
Basophils Relative: 0 %
Eosinophils Absolute: 0 10*3/uL (ref 0.0–0.5)
Eosinophils Relative: 0 %
HCT: 32.5 % — ABNORMAL LOW (ref 36.0–46.0)
Hemoglobin: 9.9 g/dL — ABNORMAL LOW (ref 12.0–15.0)
Immature Granulocytes: 1 %
Lymphocytes Relative: 7 %
Lymphs Abs: 1 10*3/uL (ref 0.7–4.0)
MCH: 31 pg (ref 26.0–34.0)
MCHC: 30.5 g/dL (ref 30.0–36.0)
MCV: 101.9 fL — ABNORMAL HIGH (ref 80.0–100.0)
Monocytes Absolute: 0.7 10*3/uL (ref 0.1–1.0)
Monocytes Relative: 5 %
Neutro Abs: 12.4 10*3/uL — ABNORMAL HIGH (ref 1.7–7.7)
Neutrophils Relative %: 87 %
Platelets: 199 10*3/uL (ref 150–400)
RBC: 3.19 MIL/uL — ABNORMAL LOW (ref 3.87–5.11)
RDW: 13.7 % (ref 11.5–15.5)
WBC: 14.3 10*3/uL — ABNORMAL HIGH (ref 4.0–10.5)
nRBC: 0 % (ref 0.0–0.2)

## 2019-03-27 LAB — URINALYSIS, ROUTINE W REFLEX MICROSCOPIC
Bilirubin Urine: NEGATIVE
Glucose, UA: NEGATIVE mg/dL
Hgb urine dipstick: NEGATIVE
Ketones, ur: NEGATIVE mg/dL
Leukocytes,Ua: NEGATIVE
Nitrite: NEGATIVE
Protein, ur: 100 mg/dL — AB
Specific Gravity, Urine: 1.011 (ref 1.005–1.030)
pH: 5 (ref 5.0–8.0)

## 2019-03-27 LAB — LACTIC ACID, PLASMA
Lactic Acid, Venous: 1.1 mmol/L (ref 0.5–1.9)
Lactic Acid, Venous: 1.6 mmol/L (ref 0.5–1.9)

## 2019-03-27 LAB — FOLATE: Folate: 12.5 ng/mL (ref >5.9)

## 2019-03-27 LAB — RETICULOCYTES
Immature Retic Fract: 12.2 % (ref 2.3–15.9)
RBC.: 3.19 MIL/uL — ABNORMAL LOW (ref 3.87–5.11)
Retic Count, Absolute: 39.6 10*3/uL (ref 19.0–186.0)
Retic Ct Pct: 1.2 % (ref 0.4–3.1)

## 2019-03-27 LAB — GLUCOSE, CAPILLARY: Glucose-Capillary: 263 mg/dL — ABNORMAL HIGH (ref 70–99)

## 2019-03-27 LAB — SARS CORONAVIRUS 2 BY RT PCR (HOSPITAL ORDER, PERFORMED IN ~~LOC~~ HOSPITAL LAB): SARS Coronavirus 2: NEGATIVE

## 2019-03-27 LAB — IRON AND TIBC
Iron: 19 ug/dL — ABNORMAL LOW (ref 28–170)
Saturation Ratios: 8 % — ABNORMAL LOW (ref 10.4–31.8)
TIBC: 246 ug/dL — ABNORMAL LOW (ref 250–450)
UIBC: 227 ug/dL

## 2019-03-27 LAB — VITAMIN B12: Vitamin B-12: 5801 pg/mL — ABNORMAL HIGH (ref 180–914)

## 2019-03-27 LAB — HEMOGLOBIN A1C
Hgb A1c MFr Bld: 6.3 % — ABNORMAL HIGH (ref 4.8–5.6)
Mean Plasma Glucose: 134.11 mg/dL

## 2019-03-27 LAB — TROPONIN I (HIGH SENSITIVITY)
Troponin I (High Sensitivity): 3824 ng/L (ref <18)
Troponin I (High Sensitivity): 3608 ng/L (ref <18)

## 2019-03-27 LAB — CREATININE, URINE, RANDOM: Creatinine, Urine: 63.03 mg/dL

## 2019-03-27 LAB — FERRITIN: Ferritin: 244 ng/mL (ref 11–307)

## 2019-03-27 LAB — SODIUM, URINE, RANDOM: Sodium, Ur: 94 mmol/L

## 2019-03-27 MED ORDER — CARVEDILOL 25 MG PO TABS
25.0000 mg | ORAL_TABLET | Freq: Two times a day (BID) | ORAL | Status: DC
  Administered 2019-03-27 – 2019-04-11 (×32): 25 mg via ORAL
  Filled 2019-03-27 (×32): qty 1

## 2019-03-27 MED ORDER — IPRATROPIUM-ALBUTEROL 0.5-2.5 (3) MG/3ML IN SOLN
3.0000 mL | Freq: Four times a day (QID) | RESPIRATORY_TRACT | Status: DC
  Administered 2019-03-27 – 2019-03-28 (×6): 3 mL via RESPIRATORY_TRACT
  Filled 2019-03-27 (×6): qty 3

## 2019-03-27 MED ORDER — METHYLPREDNISOLONE SODIUM SUCC 125 MG IJ SOLR
60.0000 mg | Freq: Two times a day (BID) | INTRAMUSCULAR | Status: DC
  Administered 2019-03-27 – 2019-03-30 (×6): 60 mg via INTRAVENOUS
  Filled 2019-03-27 (×6): qty 2

## 2019-03-27 MED ORDER — ALBUTEROL SULFATE HFA 108 (90 BASE) MCG/ACT IN AERS
4.0000 | INHALATION_SPRAY | Freq: Once | RESPIRATORY_TRACT | Status: AC
  Administered 2019-03-27: 4 via RESPIRATORY_TRACT
  Filled 2019-03-27: qty 6.7

## 2019-03-27 MED ORDER — METHYLPREDNISOLONE SODIUM SUCC 125 MG IJ SOLR: 60.0000 mg | Freq: Two times a day (BID) | INTRAMUSCULAR | Status: DC

## 2019-03-27 MED ORDER — TERAZOSIN HCL 5 MG PO CAPS
5.0000 mg | ORAL_CAPSULE | Freq: Every day | ORAL | Status: DC
  Administered 2019-03-27 – 2019-04-11 (×16): 5 mg via ORAL
  Filled 2019-03-27 (×17): qty 1

## 2019-03-27 MED ORDER — CLOPIDOGREL BISULFATE 75 MG PO TABS
75.0000 mg | ORAL_TABLET | Freq: Every day | ORAL | Status: DC
  Administered 2019-03-27 – 2019-04-06 (×11): 75 mg via ORAL
  Filled 2019-03-27 (×11): qty 1

## 2019-03-27 MED ORDER — ENOXAPARIN SODIUM 30 MG/0.3ML ~~LOC~~ SOLN
30.0000 mg | SUBCUTANEOUS | Status: DC
  Administered 2019-03-27: 30 mg via SUBCUTANEOUS
  Filled 2019-03-27: qty 0.3

## 2019-03-27 MED ORDER — SODIUM CHLORIDE 0.9 % IV SOLN
2.0000 g | INTRAVENOUS | Status: DC
  Administered 2019-03-27 – 2019-03-30 (×4): 2 g via INTRAVENOUS
  Filled 2019-03-27: qty 20
  Filled 2019-03-27: qty 2
  Filled 2019-03-27: qty 20
  Filled 2019-03-27: qty 2

## 2019-03-27 MED ORDER — LORATADINE 10 MG PO TABS
10.0000 mg | ORAL_TABLET | Freq: Every day | ORAL | Status: DC
  Administered 2019-03-27 – 2019-04-11 (×16): 10 mg via ORAL
  Filled 2019-03-27 (×16): qty 1

## 2019-03-27 MED ORDER — IPRATROPIUM-ALBUTEROL 0.5-2.5 (3) MG/3ML IN SOLN
3.0000 mL | Freq: Once | RESPIRATORY_TRACT | Status: AC
  Administered 2019-03-27: 13:00:00 3 mL via RESPIRATORY_TRACT
  Filled 2019-03-27: qty 3

## 2019-03-27 MED ORDER — FUROSEMIDE 10 MG/ML IJ SOLN
20.0000 mg | Freq: Once | INTRAMUSCULAR | Status: AC
  Administered 2019-03-27: 20 mg via INTRAVENOUS
  Filled 2019-03-27: qty 4

## 2019-03-27 MED ORDER — SODIUM POLYSTYRENE SULFONATE 15 GM/60ML PO SUSP
15.0000 g | Freq: Once | ORAL | Status: AC
  Administered 2019-03-27: 15 g via ORAL
  Filled 2019-03-27: qty 60

## 2019-03-27 MED ORDER — OLANZAPINE 5 MG PO TABS
15.0000 mg | ORAL_TABLET | Freq: Every day | ORAL | Status: DC
  Administered 2019-03-27 – 2019-04-11 (×16): 15 mg via ORAL
  Filled 2019-03-27 (×16): qty 3

## 2019-03-27 MED ORDER — LACTATED RINGERS IV BOLUS
1000.0000 mL | Freq: Once | INTRAVENOUS | Status: AC
  Administered 2019-03-27: 10:00:00 1000 mL via INTRAVENOUS

## 2019-03-27 MED ORDER — HYDRALAZINE HCL 25 MG PO TABS
25.0000 mg | ORAL_TABLET | Freq: Three times a day (TID) | ORAL | Status: DC
  Administered 2019-03-27 (×2): 25 mg via ORAL
  Filled 2019-03-27 (×2): qty 1

## 2019-03-27 MED ORDER — LEVOTHYROXINE SODIUM 50 MCG PO TABS
50.0000 ug | ORAL_TABLET | Freq: Every day | ORAL | Status: DC
  Administered 2019-03-28 – 2019-04-11 (×15): 50 ug via ORAL
  Filled 2019-03-27 (×15): qty 1

## 2019-03-27 MED ORDER — SODIUM CHLORIDE 0.9 % IV SOLN
500.0000 mg | INTRAVENOUS | Status: DC
  Administered 2019-03-27 – 2019-03-29 (×3): 500 mg via INTRAVENOUS
  Filled 2019-03-27 (×4): qty 500

## 2019-03-27 MED ORDER — SODIUM BICARBONATE 650 MG PO TABS
650.0000 mg | ORAL_TABLET | Freq: Two times a day (BID) | ORAL | Status: DC
  Administered 2019-03-27 – 2019-04-11 (×32): 650 mg via ORAL
  Filled 2019-03-27 (×32): qty 1

## 2019-03-27 MED ORDER — INSULIN ASPART 100 UNIT/ML ~~LOC~~ SOLN
0.0000 [IU] | Freq: Every day | SUBCUTANEOUS | Status: DC
  Administered 2019-03-27: 3 [IU] via SUBCUTANEOUS
  Administered 2019-03-28 – 2019-04-11 (×4): 2 [IU] via SUBCUTANEOUS
  Filled 2019-03-27: qty 0.05

## 2019-03-27 MED ORDER — FLUTICASONE PROPIONATE 50 MCG/ACT NA SUSP
1.0000 | Freq: Every day | NASAL | Status: DC
  Administered 2019-03-27 – 2019-04-11 (×16): 1 via NASAL
  Filled 2019-03-27: qty 16

## 2019-03-27 MED ORDER — METHYLPREDNISOLONE SODIUM SUCC 125 MG IJ SOLR
60.0000 mg | Freq: Four times a day (QID) | INTRAMUSCULAR | Status: DC
  Administered 2019-03-27: 13:00:00 60 mg via INTRAVENOUS
  Filled 2019-03-27: qty 2

## 2019-03-27 MED ORDER — INSULIN ASPART 100 UNIT/ML ~~LOC~~ SOLN
0.0000 [IU] | Freq: Three times a day (TID) | SUBCUTANEOUS | Status: DC
  Administered 2019-03-28: 3 [IU] via SUBCUTANEOUS
  Administered 2019-03-28 (×2): 2 [IU] via SUBCUTANEOUS
  Administered 2019-03-29: 5 [IU] via SUBCUTANEOUS
  Administered 2019-03-29: 09:00:00 3 [IU] via SUBCUTANEOUS
  Administered 2019-03-29: 7 [IU] via SUBCUTANEOUS
  Administered 2019-03-30: 5 [IU] via SUBCUTANEOUS
  Administered 2019-03-30: 7 [IU] via SUBCUTANEOUS
  Administered 2019-03-30 – 2019-03-31 (×2): 3 [IU] via SUBCUTANEOUS
  Administered 2019-03-31: 12:00:00 2 [IU] via SUBCUTANEOUS
  Administered 2019-03-31: 1 [IU] via SUBCUTANEOUS
  Administered 2019-04-01: 18:00:00 3 [IU] via SUBCUTANEOUS
  Administered 2019-04-01: 1 [IU] via SUBCUTANEOUS
  Administered 2019-04-01: 5 [IU] via SUBCUTANEOUS
  Administered 2019-04-02: 1 [IU] via SUBCUTANEOUS
  Administered 2019-04-02: 2 [IU] via SUBCUTANEOUS
  Administered 2019-04-02: 12:00:00 1 [IU] via SUBCUTANEOUS
  Administered 2019-04-03: 13:00:00 2 [IU] via SUBCUTANEOUS
  Administered 2019-04-03: 10:00:00 1 [IU] via SUBCUTANEOUS
  Administered 2019-04-04 (×2): 2 [IU] via SUBCUTANEOUS
  Administered 2019-04-04: 09:00:00 1 [IU] via SUBCUTANEOUS
  Administered 2019-04-05 (×2): 3 [IU] via SUBCUTANEOUS
  Administered 2019-04-05 – 2019-04-06 (×2): 1 [IU] via SUBCUTANEOUS
  Administered 2019-04-07: 2 [IU] via SUBCUTANEOUS
  Administered 2019-04-07 – 2019-04-09 (×4): 1 [IU] via SUBCUTANEOUS
  Administered 2019-04-10 (×2): 3 [IU] via SUBCUTANEOUS
  Administered 2019-04-11: 12:00:00 2 [IU] via SUBCUTANEOUS
  Administered 2019-04-11: 1 [IU] via SUBCUTANEOUS
  Administered 2019-04-11: 3 [IU] via SUBCUTANEOUS
  Filled 2019-03-27: qty 0.09

## 2019-03-27 NOTE — ED Provider Notes (Signed)
[facility] DEPT Provider Note   CSN: [id number] Arrival date & time: 03/22/2019  [id number]     History   Chief Complaint Chief Complaint  Patient presents with  . Shortness of Breath    HPI Ashley Velez is a 84 y.o. female.     HPI  85 year old female with history of diabetes, chronic respiratory failure, stroke comes in with chief complaint of shortness of breath. Patient reports that she has been feeling short of breath over the past few days.  She has been having a cough, but that is not new.  Cough is producing clear phlegm.  She denies any fevers, chills, body aches.  Patient denies any sick contacts.  There is some chest discomfort associated with her shortness of breath.  Symptoms are present even at rest, and worse with exertion.  There is no history of lung disease that she is aware of or any heart problems.   Past Medical History:  Diagnosis Date  . Acute respiratory failure ([place]) 09/2016  . Diabetes mellitus without complication ([place])   . Epilepsy ([place])   . Hyperlipidemia   . Hypertension   . Renal disorder    Stage 4 CKD  . Schizoaffective disorder ([place])   . Stroke ([place])   . Thyroid disease    hypothyroid  . Vitamin D deficiency     Patient Active Problem List   Diagnosis Date Noted  . Acute encephalopathy 03/26/2017  . Hypoglycemia 01/12/2017  . Status epilepticus ([place]) 01/12/2017  . AKI (acute kidney injury) ([place]) 01/12/2017  . Aspiration pneumonia ([place]) 01/12/2017  . Uncontrolled hypertension 01/12/2017  . Acute respiratory failure with hypoxia ([place])   . Seizure ([place]) 01/06/2017    History reviewed. No pertinent surgical history.   OB History   No obstetric history on file.      Home Medications    Prior to Admission medications   Medication Sig Start Date End Date Taking? Authorizing Provider  carvedilol (COREG) 25 MG tablet Take 25 mg by mouth 2 (two) times daily. 03/05/19  Yes [provider]   clopidogrel (PLAVIX) 75 MG tablet Take 75 mg by mouth daily.  08/22/15  Yes [provider]  cyanocobalamin (,VITAMIN B-12,) 1000 MCG/ML injection Inject 1,000 mcg into the muscle every 30 (thirty) days. On the 28th   Yes [provider]  felodipine (PLENDIL) 5 MG 24 hr tablet Take 5 mg by mouth daily. 03/26/19  Yes [provider]  ferrous sulfate 325 (65 FE) MG tablet Take 325 mg by mouth daily with breakfast.   Yes [provider]  fluticasone (FLONASE) 50 MCG/ACT nasal spray Place 1 spray into both nostrils daily.   Yes [provider]  glipiZIDE (GLUCOTROL) 5 MG tablet Take 5 mg by mouth daily. 03/10/19  Yes [provider]  hydrALAZINE (APRESOLINE) 25 MG tablet Take 25 mg by mouth 3 (three) times daily.   Yes [provider]  ketoconazole (NIZORAL) 2 % shampoo Apply 1 application topically as directed. Tuesday and fridays with showers 03/09/19  Yes [provider]  levothyroxine (LEVOXYL) 50 MCG tablet Take 50 mcg by mouth daily before breakfast.   Yes [provider]  lisinopril (ZESTRIL) 20 MG tablet Take 20 mg by mouth 2 (two) times daily. 03/16/19  Yes [provider]  loratadine (CLARITIN) 10 MG tablet Take 10 mg by mouth daily.   Yes [provider]  Multiple Vitamin (MULTIVITAMIN WITH MINERALS) TABS tablet Take 1 tablet by mouth  daily.   Yes [provider]  OLANZapine (ZYPREXA) 15 MG tablet Take 15 mg by mouth at bedtime. 03/26/19  Yes [provider]  sodium bicarbonate 650 MG tablet Take 650 mg by mouth 2 (two) times daily.   Yes [provider]  terazosin (HYTRIN) 5 MG capsule Take 5 mg by mouth at bedtime. 02/28/19  Yes [provider]  vitamin C (ASCORBIC ACID) 500 MG tablet Take 500 mg by mouth daily.   Yes [provider]  Vitamin D, Ergocalciferol, (DRISDOL) 50000 UNITS CAPS capsule Take 50,000 Units by mouth as directed. Monthly on the 19th  12/31/14  Yes [provider]  felodipine (PLENDIL) 10 MG 24 hr tablet Take 1 tablet (10 mg total) by mouth daily. Patient not taking: Reported on 03/13/2019 01/12/17   Dhungel, Flonnie Overman, MD  hydrALAZINE (APRESOLINE) 50 MG tablet Take 1 tablet (50 mg total) by mouth every 8 (eight) hours. Patient not taking: Reported on 03/03/2019 03/27/17   Thurnell Lose, MD  Maltodextrin-Xanthan Gum (RESOURCE THICKENUP CLEAR) POWD Take 120 g by mouth as needed (with all liquids). Patient not taking: Reported on 03/26/2017 01/12/17   Louellen Molder, MD    Family History No family history on file.  Social History Social History   Tobacco Use  . Smoking status: Never Smoker  . Smokeless tobacco: Never Used  Substance Use Topics  . Alcohol use: No  . Drug use: No     Allergies   Fosamax [alendronate] and Macrodantin [nitrofurantoin]   Review of Systems Review of Systems  Constitutional: Positive for activity change.  Respiratory: Positive for shortness of breath.   Cardiovascular: Positive for chest pain.  Gastrointestinal: Negative for nausea and vomiting.  All other systems reviewed and are negative.    Physical Exam Updated Vital Signs BP (!) 169/93 (BP Location: Left Arm)   Pulse 91   Temp 98.3 F (36.8 C) (Oral)   Resp 19   Ht 5\' 1"  (1.549 m)   Wt 49 kg   SpO2 97%   BMI 20.41 kg/m   Physical Exam Vitals signs and nursing note reviewed.  Constitutional:      Appearance: She is well-developed.  HENT:     Head: Normocephalic and atraumatic.  Neck:     Musculoskeletal: Normal range of motion and neck supple.     Vascular: No JVD.  Cardiovascular:     Rate and Rhythm: Normal rate.  Pulmonary:     Effort: Pulmonary effort is normal.     Breath sounds: No decreased breath sounds, wheezing, rhonchi or rales.  Abdominal:     General: Bowel sounds are normal.  Musculoskeletal:     Right lower leg: No edema.     Left lower leg: No edema.  Skin:    General: Skin is  warm and dry.  Neurological:     Mental Status: She is alert and oriented to person, place, and time.      ED Treatments / Results  Labs (all labs ordered are listed, but only abnormal results are displayed) Labs Reviewed  BASIC METABOLIC PANEL - Abnormal; Notable for the following components:      Result Value   Potassium 5.5 (*)    CO2 16 (*)    Glucose, Bld 212 (*)    BUN 40 (*)    Creatinine, Ser 2.58 (*)    GFR calc non Af Amer 17 (*)    GFR calc Af Amer 19 (*)    All other  components within normal limits  CBC WITH DIFFERENTIAL/PLATELET - Abnormal; Notable for the following components:   WBC 14.3 (*)    RBC 3.19 (*)    Hemoglobin 9.9 (*)    HCT 32.5 (*)    MCV 101.9 (*)    Neutro Abs 12.4 (*)    Abs Immature Granulocytes 0.09 (*)    All other components within normal limits  BRAIN NATRIURETIC PEPTIDE - Abnormal; Notable for the following components:   B Natriuretic Peptide 1,026.3 (*)    All other components within normal limits  SARS CORONAVIRUS 2 (HOSPITAL ORDER, [facility])  CULTURE, BLOOD (ROUTINE X 2)  CULTURE, BLOOD (ROUTINE X 2)  LACTIC ACID, PLASMA  LACTIC ACID, PLASMA    EKG EKG Interpretation  Date/Time:  Monday March 27 2019 07:38:54 EDT Ventricular Rate:  88 PR Interval:    QRS Duration: 80 QT Interval:  357 QTC Calculation: 432 R Axis:   -8 Text Interpretation:  Sinus rhythm Probable left atrial enlargement Abnormal R-wave progression, early transition Probable LVH with secondary repol abnrm No acute changes No significant change since last tracing Confirmed by Varney Biles [phone]) on 03/25/2019 8:42:42 AM   Radiology Dg Chest Port 1 View  Result Date: 03/10/2019 CLINICAL DATA:  Shortness of breath, wheezing, low oxygen saturation, chronic cough, hypertension, stroke, diabetes mellitus EXAM: PORTABLE CHEST 1 VIEW COMPARISON:  Portable exam 0829 hours compared to earlier two-view chest exam of 0153 hours FINDINGS:  Normal heart size, mediastinal contours, and pulmonary vascularity. Atherosclerotic calcification aorta. Small LEFT pleural effusion. New bibasilar infiltrates since earlier exam could represent aspiration or developing pneumonia. Upper lungs clear. No pneumothorax. Bones demineralized. IMPRESSION: New bibasilar pulmonary infiltrates question pneumonia versus aspiration. Small LEFT pleural effusion. Electronically Signed   By: Lavonia Dana M.D.   On: 03/18/2019 08:43    Procedures .Critical Care Performed by: Varney Biles, MD Authorized by: Varney Biles, MD   Critical care provider statement:    Critical care time (minutes):  32   Critical care was necessary to treat or prevent imminent or life-threatening deterioration of the following conditions:  Respiratory failure and circulatory failure   Critical care was time spent personally by me on the following activities:  Discussions with consultants, evaluation of patient's response to treatment, examination of patient, ordering and performing treatments and interventions, ordering and review of laboratory studies, ordering and review of radiographic studies, pulse oximetry, re-evaluation of patient's condition, obtaining history from patient or surrogate and review of old charts   (including critical care time)  Medications Ordered in ED Medications  cefTRIAXone (ROCEPHIN) 2 g in sodium chloride 0.9 % 100 mL IVPB (0 g Intravenous Stopped 03/01/2019 1046)  azithromycin (ZITHROMAX) 500 mg in sodium chloride 0.9 % 250 mL IVPB (500 mg Intravenous New Bag/Given 03/01/2019 1112)  albuterol (VENTOLIN HFA) 108 (90 Base) MCG/ACT inhaler 4 puff (4 puffs Inhalation Given 03/17/2019 0818)  lactated ringers bolus 1,000 mL (1,000 mLs Intravenous New Bag/Given 03/22/2019 1015)     Initial Impression / Assessment and Plan / ED Course  I have reviewed the triage vital signs and the nursing notes.  Pertinent labs & imaging results that were available during my care  of the patient were reviewed by me and considered in my medical decision making (see chart for details).        84 year old comes in a chief complaint of shortness of breath. She has a chronic cough.  She is producing clear phlegm right now.  On exam there is mild tachypnea without any acute respiratory distress or hypoxia.  Lung exam is completely clear.  There is no evidence of volume overload and there is no pitting edema or unilateral leg swelling.  Patient's history is indicative of no known lung disease or cardiac problems.  Differential diagnosis would include COVID-19 since she lives in a assisted living and is complaining of cough and shortness of breath.  Additionally, we are considering ACS, CHF and COPD in the differential as well.  11:29 AM Patient's lung exam and chest x-ray are normal, however her BNP is elevated. Patient is also noted to have AKI. Working diagnosis is that patient has organ dysfunction because of questionable infection versus CHF. We will admit her for further work-up.  She has received IV antibiotics in the ED.  Final Clinical Impressions(s) / ED Diagnoses   Final diagnoses:  Acute respiratory failure with hypoxia [facility])    ED Discharge Orders    None       Varney Biles, MD 03/09/2019 1130

## 2019-03-27 NOTE — CV Procedure (Signed)
Echo attempted but patient was unable to lay back b/c of shortness of breath then she became nauseaus. Will attempt again on 03/28/19.

## 2019-03-27 NOTE — ED Notes (Signed)
X-ray at bedside

## 2019-03-27 NOTE — H&P (Addendum)
History and Physical    Ashley Velez [id number] DOB: 02-Jun-1935 DOA: 03/01/2019  PCP: Crist Infante, MD  Patient coming from: [facility]  I have personally briefly reviewed patient's old medical records in [place]  Chief Complaint: sob   HPI: Ashley Velez is a 84 y.o. female with medical history significant of DM, hypertension, hyperlipidemia, stage 4 CKD, schizoaffective disorder,  Hypothyroidism, stroke, was sent from [facility] for sob, wheezing and hypoxia. Patient is confused, poor historian and most of the history was obtained from [place] [facility] and the patient's daughter. She denies any fevers, reports sob at rest and on exertion, associated with cough, no chest pain. No nausea, vomiting or abdominal pain, or diarrhea. No dysuria.   ED Course: on arrival to ED, pt is alert and slightly tachycardia, tachypneic, hypoxic on RA, and required 2lit of [place] oxygen to keep sats greater than 90% labs were significant fr creatinine at 2.58, BUN of 40, bicarb of 16, potassium of 5.5, BNP of 1026, lactic acid of 1.1, wbc count of 14.3, hemoglobin of 9.9, covid 19 negative and CXR showed New bibasilar pulmonary infiltrates question pneumonia versus aspiration.  She was referred to [facility] for admission for acute respiratory failure with hypoxia.   Review of Systems: couldn't be obtained, pt is confused.   Past Medical History:  Diagnosis Date  . Acute respiratory failure ([place]) 09/2016  . Diabetes mellitus without complication ([place])   . Epilepsy ([place])   . Hyperlipidemia   . Hypertension   . Renal disorder    Stage 4 CKD  . Schizoaffective disorder ([place])   . Stroke ([place])   . Thyroid disease    hypothyroid  . Vitamin D deficiency     History reviewed. No pertinent surgical history. Social history:   reports that she has never smoked. She has never used smokeless tobacco. She reports that she does not drink alcohol or use drugs.  Allergies  Allergen Reactions  .  Fosamax [Alendronate] Other (See Comments)    Noted to be an allergy on the patient's paperwork from facility, but no reaction is recorded  . Macrodantin [Nitrofurantoin] Other (See Comments)    Noted to be an allergy on the patient's paperwork from facility, but no reaction is recorded    family history couldn't be obtained due to confusion.   Prior to Admission medications   Medication Sig Start Date End Date Taking? Authorizing Provider  carvedilol (COREG) 25 MG tablet Take 25 mg by mouth 2 (two) times daily. 03/05/19  Yes [provider]  clopidogrel (PLAVIX) 75 MG tablet Take 75 mg by mouth daily.  08/22/15  Yes [provider]  cyanocobalamin (,VITAMIN B-12,) 1000 MCG/ML injection Inject 1,000 mcg into the muscle every 30 (thirty) days. On the 28th   Yes [provider]  felodipine (PLENDIL) 5 MG 24 hr tablet Take 5 mg by mouth daily. 03/26/19  Yes [provider]  ferrous sulfate 325 (65 FE) MG tablet Take 325 mg by mouth daily with breakfast.   Yes [provider]  fluticasone (FLONASE) 50 MCG/ACT nasal spray Place 1 spray into both nostrils daily.   Yes [provider]  glipiZIDE (GLUCOTROL) 5 MG tablet Take 5 mg by mouth daily. 03/10/19  Yes [provider]  hydrALAZINE (APRESOLINE) 25 MG tablet Take 25 mg by mouth 3 (three) times daily.   Yes [provider]  ketoconazole (NIZORAL) 2 % shampoo Apply 1 application topically as directed. Tuesday and fridays with showers  03/09/19  Yes [provider]  levothyroxine (LEVOXYL) 50 MCG tablet Take 50 mcg by mouth daily before breakfast.   Yes [provider]  lisinopril (ZESTRIL) 20 MG tablet Take 20 mg by mouth 2 (two) times daily. 03/16/19  Yes [provider]  loratadine (CLARITIN) 10 MG tablet Take 10 mg by mouth daily.   Yes [provider]  Multiple Vitamin (MULTIVITAMIN WITH MINERALS) TABS tablet Take 1 tablet by mouth daily.   Yes  [provider]  OLANZapine (ZYPREXA) 15 MG tablet Take 15 mg by mouth at bedtime. 03/26/19  Yes [provider]  sodium bicarbonate 650 MG tablet Take 650 mg by mouth 2 (two) times daily.   Yes [provider]  terazosin (HYTRIN) 5 MG capsule Take 5 mg by mouth at bedtime. 02/28/19  Yes [provider]  vitamin C (ASCORBIC ACID) 500 MG tablet Take 500 mg by mouth daily.   Yes [provider]  Vitamin D, Ergocalciferol, (DRISDOL) 50000 UNITS CAPS capsule Take 50,000 Units by mouth as directed. Monthly on the 19th 12/31/14  Yes [provider]  felodipine (PLENDIL) 10 MG 24 hr tablet Take 1 tablet (10 mg total) by mouth daily. Patient not taking: Reported on 03/14/2019 01/12/17   Dhungel, Flonnie Overman, MD  hydrALAZINE (APRESOLINE) 50 MG tablet Take 1 tablet (50 mg total) by mouth every 8 (eight) hours. Patient not taking: Reported on 03/26/2019 03/27/17   Thurnell Lose, MD  Maltodextrin-Xanthan Gum (RESOURCE THICKENUP CLEAR) POWD Take 120 g by mouth as needed (with all liquids). Patient not taking: Reported on 08/15/202018 01/12/17   Louellen Molder, MD    Physical Exam:  Constitutional: mod distress from sob and wheezing   Vitals:   03/23/2019 1000 03/21/2019 1022 03/15/2019 1030 03/24/2019 1100  BP:  (!) 171/88 (!) 163/90 (!) 169/93  Pulse: 85 91    Resp:  20  19  Temp:      TempSrc:      SpO2: 97% 97%  97%  Weight:      Height:       Eyes: PERRL, lids and conjunctivae normal ENMT: Mucous membranes are moist.  Neck: normal, supple, no masses, no thyromegaly Respiratory: diffuse bilateral inspiratory and exp wheezing present, tachypnea present, air entry fair.  Cardiovascular: tachycardic, s1s2, heard.   Abdomen: soft, NT, ND BS+ Musculoskeletal: no clubbing / cyanosis. No joint deformity upper and lower extremities.   Skin: no rashes, lesions, ulcers. No induration Neurologic: CN 2-12 grossly intact. Alert but confused, able to answer simple  questions and follow some commands.  Psychiatric: . Normal mood.     Labs on Admission: I have personally reviewed following labs and imaging studies  CBC: Recent Labs  Lab 03/26/2019 0835  WBC 14.3*  NEUTROABS 12.4*  HGB 9.9*  HCT 32.5*  MCV 101.9*  PLT [id number]   Basic Metabolic Panel: Recent Labs  Lab 03/27/19 0835  NA 136  K 5.5*  CL 108  CO2 16*  GLUCOSE 212*  BUN 40*  CREATININE 2.58*  CALCIUM 8.9   GFR: Estimated Creatinine Clearance: 12.7 mL/min (A) (by C-G formula based on SCr of 2.58 mg/dL (H)). Liver Function Tests: No results for input(s): AST, ALT, ALKPHOS, BILITOT, PROT, ALBUMIN in the last 168 hours. No results for input(s): LIPASE, AMYLASE in the last 168 hours. No results for input(s): AMMONIA in the last 168 hours. Coagulation Profile: No results for input(s): INR, PROTIME in the last 168 hours. Cardiac Enzymes: No results for  input(s): CKTOTAL, CKMB, CKMBINDEX, TROPONINI in the last 168 hours. BNP (last 3 results) No results for input(s): PROBNP in the last 8760 hours. HbA1C: No results for input(s): HGBA1C in the last 72 hours. CBG: No results for input(s): GLUCAP in the last 168 hours. Lipid Profile: No results for input(s): CHOL, HDL, LDLCALC, TRIG, CHOLHDL, LDLDIRECT in the last 72 hours. Thyroid Function Tests: No results for input(s): TSH, T4TOTAL, FREET4, T3FREE, THYROIDAB in the last 72 hours. Anemia Panel: No results for input(s): VITAMINB12, FOLATE, FERRITIN, TIBC, IRON, RETICCTPCT in the last 72 hours. Urine analysis:    Component Value Date/Time   COLORURINE STRAW (A) 008/22/202018 0410   APPEARANCEUR CLEAR 008/20/202018 0410   LABSPEC 1.005 03/26/2017 0410   PHURINE 8.0 03/26/2017 0410   GLUCOSEU 150 (A) 03/26/2017 0410   HGBUR SMALL (A) 03/26/2017 0410   BILIRUBINUR NEGATIVE 03/26/2017 0410   KETONESUR NEGATIVE 03/26/2017 0410   PROTEINUR 30 (A) 03/26/2017 0410   NITRITE NEGATIVE 03/26/2017 0410   LEUKOCYTESUR NEGATIVE 03/26/2017  0410    Radiological Exams on Admission: Dg Chest Port 1 View  Result Date: 03/27/2019 CLINICAL DATA:  Shortness of breath, wheezing, low oxygen saturation, chronic cough, hypertension, stroke, diabetes mellitus EXAM: PORTABLE CHEST 1 VIEW COMPARISON:  Portable exam 0829 hours compared to earlier two-view chest exam of 0153 hours FINDINGS: Normal heart size, mediastinal contours, and pulmonary vascularity. Atherosclerotic calcification aorta. Small LEFT pleural effusion. New bibasilar infiltrates since earlier exam could represent aspiration or developing pneumonia. Upper lungs clear. No pneumothorax. Bones demineralized. IMPRESSION: New bibasilar pulmonary infiltrates question pneumonia versus aspiration. Small LEFT pleural effusion. Electronically Signed   By: Lavonia Dana M.D.   On: 03/27/2019 08:43    EKG: Independently reviewed. Sinus rhythm.   Assessment/Plan Active Problems:   AKI (acute kidney injury) ([place])   Acute respiratory failure with hypoxia secondary to ? Hyperreactive airway disease, since she does not have a h/o of copd or asthma , health care associated pneumonia/ aspiration pneumonia and CHF. Admit for IV antibiotics, . Lactic acid wnl.  [place] oxygen to keep sats greater than 90%. Get urine for strep and legionella. SLP evaluation to rule out dysphagia.  Sputum cultures.  Duo nebs and IV solumedrol 60 mg every 12 hours.   Elevated BNP and infiltrates on CXR, rule out CHF.  Get echocardiogram. EKG is unchanged from prior.  Get troponin's. Monitor the patient on telemetry.  No pedal edema.  One dose of IV lasix given.     AKI, on stage 4 CKD, with non gap metabolic acidosis with hyperkalemia:  Differential include  ATN from pneumonia vs dehydration. Get US renal to rule out obstruction.  Get UA, and urine electrolytes.  Kayexalate ordered.  Repeat renal parameters tonight.     Anemia of chronic disease.  Transfuse to keep hemoglobin greater than 7.  Anemia panel.     Hypertensive urgency:  Restart home meds and add IV hydralazine.   Diabetes Mellitus:  CBG (last 3)  No results for input(s): GLUCAP in the last 72 hours.  Get a1C,  Resume SSI.   Schizoaffective disorder: Resume home meds.    Hypothyroidism: Get TSH, and continue with home dose of synthroid.   Severity of Illness: The appropriate patient status for this patient is INPATIENT. Inpatient status is judged to be reasonable and necessary in order to provide the required intensity of service to ensure the patient's safety. The patient's presenting symptoms, physical exam findings, and initial radiographic and laboratory data in the  context of their chronic comorbidities is felt to place them at high risk for further clinical deterioration. Furthermore, it is not anticipated that the patient will be medically stable for discharge from the hospital within 2 midnights of admission. The following factors support the patient status of inpatient.   " The patient's presenting symptoms include sob, wheezing. . " The worrisome physical exam findings include hypoxia,  " The initial radiographic and laboratory data are worrisome because of pneumonia and AKI " The chronic co-morbidities include stage 4 CKD,    * I certify that at the point of admission it is my clinical judgment that the patient will require inpatient hospital care spanning beyond 2 midnights from the point of admission due to high intensity of service, high risk for further deterioration and high frequency of surveillance required.*    DVT prophylaxis: lovenox.  Code Status: full code. Family Communication: discussed with daughter over the phone.  Disposition Plan: pending clinical improvement.  Consults called: none.  Admission status: inpatient/ telemetry   Hosie Poisson MD [facility] Pager [phone]   If 7PM-7AM, please contact night-coverage [email or web address] Password [email or web address]  03/16/2019, 11:59 AM     Addendum:   Cardiology consulted for elevated BNP and troponins, to see if she needs to be started on IV heparin vs medical management .  Echocardiogram ordered.  Foley placed for urinary retention.    Hosie Poisson, MD [phone]

## 2019-03-27 NOTE — ED Notes (Signed)
Report called to floor RN

## 2019-03-27 NOTE — ED Triage Notes (Signed)
Pt arrives [facility] from [facility] for [facility] and wheezing, was said to have O2 SAT in the 80's at the facility, 94% R/A with EMS. Pt has hx of chronic cough, CKD, DM.

## 2019-03-28 ENCOUNTER — Encounter (HOSPITAL_COMMUNITY): Payer: Self-pay | Admitting: Cardiology

## 2019-03-28 ENCOUNTER — Inpatient Hospital Stay (HOSPITAL_COMMUNITY): Payer: Medicare Other

## 2019-03-28 DIAGNOSIS — I340 Nonrheumatic mitral (valve) insufficiency: Secondary | ICD-10-CM

## 2019-03-28 DIAGNOSIS — J9601 Acute respiratory failure with hypoxia: Secondary | ICD-10-CM

## 2019-03-28 LAB — GLUCOSE, CAPILLARY
Glucose-Capillary: 199 mg/dL — ABNORMAL HIGH (ref 70–99)
Glucose-Capillary: 249 mg/dL — ABNORMAL HIGH (ref 70–99)
Glucose-Capillary: 230 mg/dL — ABNORMAL HIGH (ref 70–99)
Glucose-Capillary: 210 mg/dL — ABNORMAL HIGH (ref 70–99)

## 2019-03-28 LAB — HEPARIN LEVEL (UNFRACTIONATED): Heparin Unfractionated: 0.45 IU/mL (ref 0.30–0.70)

## 2019-03-28 LAB — BASIC METABOLIC PANEL
Anion gap: 13 (ref 5–15)
BUN: 45 mg/dL — ABNORMAL HIGH (ref 8–23)
CO2: 18 mmol/L — ABNORMAL LOW (ref 22–32)
Calcium: 8.6 mg/dL — ABNORMAL LOW (ref 8.9–10.3)
Chloride: 107 mmol/L (ref 98–111)
Creatinine, Ser: 2.76 mg/dL — ABNORMAL HIGH (ref 0.44–1.00)
GFR calc Af Amer: 18 mL/min — ABNORMAL LOW (ref >60)
GFR calc non Af Amer: 15 mL/min — ABNORMAL LOW (ref >60)
Glucose, Bld: 184 mg/dL — ABNORMAL HIGH (ref 70–99)
Potassium: 4.4 mmol/L (ref 3.5–5.1)
Sodium: 138 mmol/L (ref 135–145)

## 2019-03-28 LAB — CBC
HCT: 29.8 % — ABNORMAL LOW (ref 36.0–46.0)
Hemoglobin: 9.7 g/dL — ABNORMAL LOW (ref 12.0–15.0)
MCH: 31.7 pg (ref 26.0–34.0)
MCHC: 32.6 g/dL (ref 30.0–36.0)
MCV: 97.4 fL (ref 80.0–100.0)
Platelets: 212 10*3/uL (ref 150–400)
RBC: 3.06 MIL/uL — ABNORMAL LOW (ref 3.87–5.11)
RDW: 13.7 % (ref 11.5–15.5)
WBC: 14.5 10*3/uL — ABNORMAL HIGH (ref 4.0–10.5)
nRBC: 0 % (ref 0.0–0.2)

## 2019-03-28 LAB — TROPONIN I (HIGH SENSITIVITY)
Troponin I (High Sensitivity): 3696 ng/L (ref <18)
Troponin I (High Sensitivity): 3588 ng/L (ref <18)

## 2019-03-28 LAB — TSH: TSH: 0.91 u[IU]/mL (ref 0.350–4.500)

## 2019-03-28 LAB — ECHOCARDIOGRAM COMPLETE
Height: 63 in
Weight: 1849.6 oz

## 2019-03-28 LAB — LEGIONELLA PNEUMOPHILA SEROGP 1 UR AG: L. pneumophila Serogp 1 Ur Ag: NEGATIVE

## 2019-03-28 LAB — STREP PNEUMONIAE URINARY ANTIGEN: Strep Pneumo Urinary Antigen: NEGATIVE

## 2019-03-28 MED ORDER — ASPIRIN EC 81 MG PO TBEC
81.0000 mg | DELAYED_RELEASE_TABLET | Freq: Every day | ORAL | Status: DC
  Administered 2019-03-28 – 2019-04-11 (×15): 81 mg via ORAL
  Filled 2019-03-28 (×15): qty 1

## 2019-03-28 MED ORDER — IPRATROPIUM-ALBUTEROL 0.5-2.5 (3) MG/3ML IN SOLN
3.0000 mL | Freq: Three times a day (TID) | RESPIRATORY_TRACT | Status: DC
  Administered 2019-03-29 – 2019-04-04 (×19): 3 mL via RESPIRATORY_TRACT
  Filled 2019-03-28 (×19): qty 3

## 2019-03-28 MED ORDER — HEPARIN (PORCINE) 25000 UT/250ML-% IV SOLN
600.0000 [IU]/h | INTRAVENOUS | Status: DC
  Administered 2019-03-28 – 2019-03-30 (×2): 600 [IU]/h via INTRAVENOUS
  Filled 2019-03-28 (×2): qty 250

## 2019-03-28 MED ORDER — HEPARIN BOLUS VIA INFUSION
3000.0000 [IU] | Freq: Once | INTRAVENOUS | Status: AC
  Administered 2019-03-28: 12:00:00 3000 [IU] via INTRAVENOUS
  Filled 2019-03-28: qty 3000

## 2019-03-28 MED ORDER — PERFLUTREN LIPID MICROSPHERE
1.0000 mL | INTRAVENOUS | Status: AC | PRN
  Administered 2019-03-28: 3 mL via INTRAVENOUS
  Filled 2019-03-28: qty 10

## 2019-03-28 MED ORDER — FUROSEMIDE 10 MG/ML IJ SOLN
80.0000 mg | Freq: Once | INTRAMUSCULAR | Status: AC
  Administered 2019-03-28: 80 mg via INTRAVENOUS
  Filled 2019-03-28: qty 8

## 2019-03-28 MED ORDER — ATORVASTATIN CALCIUM 40 MG PO TABS
40.0000 mg | ORAL_TABLET | Freq: Every day | ORAL | Status: DC
  Administered 2019-03-28 – 2019-04-11 (×15): 40 mg via ORAL
  Filled 2019-03-28 (×15): qty 1

## 2019-03-28 MED ORDER — FUROSEMIDE 10 MG/ML IJ SOLN: 40.0000 mg | Freq: Every day | INTRAMUSCULAR | Status: DC

## 2019-03-28 NOTE — Progress Notes (Signed)
[place] for IV heparin Indication: chest pain/ACS  Allergies  Allergen Reactions  . Fosamax [Alendronate] Other (See Comments)    Noted to be an allergy on the patient's paperwork from facility, but no reaction is recorded  . Macrodantin [Nitrofurantoin] Other (See Comments)    Noted to be an allergy on the patient's paperwork from facility, but no reaction is recorded    Patient Measurements: Height: 5\' 3"  (160 cm) Weight: 115 lb 9.6 oz (52.4 kg) IBW/kg (Calculated) : 52.4 Heparin Dosing Weight: 52 kg  Vital Signs: Temp: 97.6 F (36.4 C) (09/01 1239) Temp Source: Oral (09/01 1239) BP: 140/72 (09/01 1239) Pulse Rate: 78 (09/01 1239)  Labs: Recent Labs    03/14/2019 0835  03/11/2019 1533 03/28/19 0042 03/28/19 0538 03/28/19 1902  HGB 9.9*  --   --  9.7*  --   --   HCT 32.5*  --   --  29.8*  --   --   PLT 199  --   --  212  --   --   HEPARINUNFRC  --   --   --   --   --  0.45  CREATININE 2.58*  --  2.59* 2.76*  --   --   TROPONINIHS  --    < > 3,608* 3,696* 3,588*  --    < > = values in this interval not displayed.    Estimated Creatinine Clearance: 13 mL/min (A) (by C-G formula based on SCr of 2.76 mg/dL (H)).   Medical History: Past Medical History:  Diagnosis Date  . Acute respiratory failure ([place]) 09/2016  . Diabetes mellitus without complication ([place])   . Epilepsy ([place])   . Hyperlipidemia   . Hypertension   . Renal disorder    Stage 4 CKD  . Schizoaffective disorder ([place])   . Stroke ([place])   . Thyroid disease    hypothyroid  . Vitamin D deficiency     Medications:  Medications Prior to Admission  Medication Sig Dispense Refill Last Dose  . carvedilol (COREG) 25 MG tablet Take 25 mg by mouth 2 (two) times daily.   03/26/2019 at 830pm  . clopidogrel (PLAVIX) 75 MG tablet Take 75 mg by mouth daily.   1 03/26/2019 at 5pm  . cyanocobalamin (,VITAMIN B-12,) 1000 MCG/ML injection Inject 1,000 mcg into the muscle every 30  (thirty) days. On the 28th   03/24/2019 at 7pm  . felodipine (PLENDIL) 5 MG 24 hr tablet Take 5 mg by mouth daily.   03/26/2019 at 930am  . ferrous sulfate 325 (65 FE) MG tablet Take 325 mg by mouth daily with breakfast.   03/26/2019 at 930am  . fluticasone (FLONASE) 50 MCG/ACT nasal spray Place 1 spray into both nostrils daily.   03/26/2019 at 5pm  . glipiZIDE (GLUCOTROL) 5 MG tablet Take 5 mg by mouth daily.   03/26/2019 at 530am  . hydrALAZINE (APRESOLINE) 25 MG tablet Take 25 mg by mouth 3 (three) times daily.   03/26/2019 at 830pm  . ketoconazole (NIZORAL) 2 % shampoo Apply 1 application topically as directed. Tuesday and fridays with showers   03/24/2019 at 830am  . levothyroxine (LEVOXYL) 50 MCG tablet Take 50 mcg by mouth daily before breakfast.   03/26/2019 at 530am  . lisinopril (ZESTRIL) 20 MG tablet Take 20 mg by mouth 2 (two) times daily.   03/26/2019 at 830pm  . loratadine (CLARITIN) 10 MG tablet Take 10 mg by mouth daily.   03/26/2019 at  930am  . Multiple Vitamin (MULTIVITAMIN WITH MINERALS) TABS tablet Take 1 tablet by mouth daily.   03/26/2019 at 5pm  . OLANZapine (ZYPREXA) 15 MG tablet Take 15 mg by mouth at bedtime.   03/26/2019 at 830pm  . sodium bicarbonate 650 MG tablet Take 650 mg by mouth 2 (two) times daily.   03/26/2019 at 830pm  . terazosin (HYTRIN) 5 MG capsule Take 5 mg by mouth at bedtime.   03/26/2019 at 930pm  . vitamin C (ASCORBIC ACID) 500 MG tablet Take 500 mg by mouth daily.   03/26/2019 at 930am  . Vitamin D, Ergocalciferol, (DRISDOL) 50000 UNITS CAPS capsule Take 50,000 Units by mouth as directed. Monthly on the 19th  1 03/15/2019 at 8am  . felodipine (PLENDIL) 10 MG 24 hr tablet Take 1 tablet (10 mg total) by mouth daily. (Patient not taking: Reported on 03/24/2019) 30 tablet 0 Not Taking at Unknown time  . hydrALAZINE (APRESOLINE) 50 MG tablet Take 1 tablet (50 mg total) by mouth every 8 (eight) hours. (Patient not taking: Reported on 03/20/2019) 90 tablet 0 Not Taking at  Unknown time  . Maltodextrin-Xanthan Gum (RESOURCE THICKENUP CLEAR) POWD Take 120 g by mouth as needed (with all liquids). (Patient not taking: Reported on 03/26/2017) 10 Can 0 Not Taking at Unknown time   Scheduled:  . aspirin EC  81 mg Oral Daily  . atorvastatin  40 mg Oral q1800  . carvedilol  25 mg Oral BID  . clopidogrel  75 mg Oral q1800  . fluticasone  1 spray Each Nare q1800  . insulin aspart  0-5 Units Subcutaneous QHS  . insulin aspart  0-9 Units Subcutaneous TID WC  . [START ON 03/29/2019] ipratropium-albuterol  3 mL Nebulization TID  . levothyroxine  50 mcg Oral Q0600  . loratadine  10 mg Oral Daily  . methylPREDNISolone (SOLU-MEDROL) injection  60 mg Intravenous Q12H  . OLANZapine  15 mg Oral QHS  . sodium bicarbonate  650 mg Oral BID  . terazosin  5 mg Oral QHS    Assessment: 42 yoF with PMH DM2, HTN, HLD, CKD4, hypothyroid, CVA on Plavix, admitted with hypoxia. Now with troponins elevated 100x ULN, Cardiology requesting IV heparin with Pharmacy to dose.   Baseline INR, aPTT: not done  Anticoagulation prior to admission: none  Last dose Lovenox ppx at 10p yesterday (8/31)  Plavix PTA, Cards adding ASA 81 now with UFH  Significant events:  Today, 03/28/2019, PM:   1st heparin level therapeutic on current IV heparin rate of 600 units/hr  No reported bleeding  Goal of Therapy: Heparin level 0.3-0.7 units/ml Monitor platelets by anticoagulation protocol: Yes  Plan:  Continue current IV heparin rate of 600 units/hr  Recheck heparin level at 0300 9/2 to confirm continued goal heparin levels at current IV heparin rate  Daily CBC, daily heparin level once stable  Monitor for signs of bleeding or thrombosis   Adrian Saran, PharmD, BCPS 03/28/2019 8:39 PM

## 2019-03-28 NOTE — Progress Notes (Signed)
Echocardiogram 2D Echocardiogram has been performed.  Ashley Velez 03/28/2019, 11:03 AM

## 2019-03-28 NOTE — TOC Initial Note (Signed)
Transition of Care [facility]) - Initial/Assessment Note    Patient Details  Name: Ashley Velez MRN: [id number] Date of Birth: [birth year]  [facility]) CM/SW Contact:    Ashley Phi, RN Phone Number: 03/28/2019, 3:40 PM  Clinical Narrative: Patient alert +oriented to self.Spoke to patient's dtr  Ashley Velez about d/c plans-return back to [facility] aware. Await passr-faxed addtl info to the state-await response.                Expected Discharge Plan: [place] Barriers to Discharge: Continued Medical Work up   Patient Goals and CMS Choice Patient states their goals for this hospitalization and ongoing recovery are:: go back to [employer] [facility] [employer].gov Compare Post Acute Care list provided to:: Patient Represenative (must comment) Choice offered to / list presented to : Adult Children  Expected Discharge Plan and Services Expected Discharge Plan: [place]   Discharge Planning Services: CM Consult   Living arrangements for the past 2 months: [place] Expected Discharge Date: (unknown)                                    Prior Living Arrangements/Services Living arrangements for the past 2 months: [place] Lives with:: Facility Resident Patient language and need for interpreter reviewed:: Yes Do you feel safe going back to the place where you live?: Yes      Need for Family Participation in Patient Care: No (Comment) Care giver support system in place?: Yes (comment)   Criminal Activity/Legal Involvement Pertinent to Current Situation/Hospitalization: No - Comment as needed  Activities of Daily Living Home Assistive Devices/Equipment: Grab bars around toilet, Grab bars in shower, Hand-held shower hose, Hospital bed, Walker (specify type), Wheelchair, Blood pressure cuff, Scales, CBG Meter([facility] has necessary equipment for their residents) ADL Screening (condition at time of  admission) Patient's cognitive ability adequate to safely complete daily activities?: No Is the patient deaf or have difficulty hearing?: Yes(slight) Does the patient have difficulty seeing, even when wearing glasses/contacts?: No Does the patient have difficulty concentrating, remembering, or making decisions?: Yes Patient able to express need for assistance with ADLs?: No Does the patient have difficulty dressing or bathing?: Yes Independently performs ADLs?: No Communication: Independent Dressing (OT): Needs assistance Is this a change from baseline?: Pre-admission baseline Grooming: Needs assistance Is this a change from baseline?: Pre-admission baseline Feeding: Needs assistance Is this a change from baseline?: Pre-admission baseline Bathing: Needs assistance Is this a change from baseline?: Pre-admission baseline Toileting: Needs assistance Is this a change from baseline?: Pre-admission baseline In/Out Bed: Needs assistance Is this a change from baseline?: Pre-admission baseline Walks in Home: Needs assistance Is this a change from baseline?: Pre-admission baseline Does the patient have difficulty walking or climbing stairs?: Yes Weakness of Legs: Both Weakness of Arms/Hands: Both  Permission Sought/Granted Permission sought to share information with : Case Manager Permission granted to share information with : Yes, Verbal Permission Granted  Share Information with NAME: Ashley Velez  Permission granted to share info w AGENCY: [facility]  Permission granted to share info w Relationship: dtr  Permission granted to share info w Contact Information: [id number]  Emotional Assessment Appearance:: Appears stated age   Affect (typically observed): Accepting Orientation: : Oriented to Self Alcohol / Substance Use: Never Used    Admission diagnosis:  Acute respiratory failure with hypoxia ([place]) [J96.01] AKI (acute kidney injury) (  [place]) [N17.9] Patient Active Problem List    Diagnosis Date Noted  . Acute encephalopathy 03/26/2017  . Hypoglycemia 01/12/2017  . Status epilepticus ([place]) 01/12/2017  . AKI (acute kidney injury) ([place]) 01/12/2017  . Aspiration pneumonia ([place]) 01/12/2017  . Uncontrolled hypertension 01/12/2017  . Acute respiratory failure with hypoxia ([place])   . Seizure ([place]) 01/06/2017   PCP:  Crist Infante, MD Pharmacy:   [facility] [place], [address] [place] [address] Phone: [phone] Fax: [phone]     Social Determinants of Health (SDOH) Interventions    Readmission Risk Interventions No flowsheet data found.

## 2019-03-28 NOTE — Progress Notes (Signed)
[facility] Progress Note  Patient: Ashley Velez JJK:[id number]   PCP: Crist Infante, MD DOB: 1935-03-03   DOA: 02/28/2019   DOS: 03/28/2019   Date of Service: the patient was seen and examined on 03/28/2019  Brief hospital course: Pt. with PMH of DM, hypertension, hyperlipidemia, stage 4 CKD, schizoaffective disorder,  Hypothyroidism, stroke; admitted on 03/08/2019, presented with complaint of , was found to have non-STEMI and acute systolic CHF. Currently further plan is continue current diuresis.  Subjective: Patient is sleepy but easily awakened.  Denies any acute complaint.  Appear short of breath.  Has some cough.  No nausea no vomiting but no fever.  Assessment and Plan: 1.  Acute respiratory failure with hypoxia. Acute systolic CHF. Non-STEMI. Presents with complaints of confusion and shortness of breath. Was hypoxic in the emergency department. Currently on 2 L of nasal cannula. Found to be having acute systolic CHF. Cardiology consulted. Currently recommend conservative management. Currently on IV heparin.  Also on IV Lasix. Minimal urine output. Echocardiogram shows 30 to 35% EF with hypokinesis of the left ventricle and apical area as well as dyskinesis of the left apical segment. Suspect that the patient had an MI prior to this admission which led to systolic CHF presenting with shortness of breath. Monitor response with diuresis.  2.  Acute kidney injury on chronic kidney disease stage IV. Baseline renal function serum creatinine 1.5.  EGFR of 30. Currently serum creatinine 2.75.  EGFR of 15. CrCl of 13. Minimal urine output despite aggressive IV diuresis. I suspect that the patient will progress to ESRD. Motivated for hemodialysis given her acute non-STEMI as well as dementia and poor functional status at her baseline with a recurrent fall. Discussed with the family. Recommended palliative care. Recommend to consider DNR/DNI. Family would like to have a  meeting with the rest of the family members before making any decisions.  3.  Goals of care discussion. Had an extensive discussion with family regarding patient's current prognosis. With acute non-STEMI as well as acute systolic CHF as well as acute on chronic severe kidney disease currently the patient is not a great candidate for any aggressive intervention. Also may not be able to tolerate any intervention without any significant side effect. Would recommend palliative care consultation.  4.  Hypertensive urgency. Currently on home medication. Monitor.  5. Type 2 Diabetes Mellitus, uncontroled with renal complication Sliding scale insulin.  6.  Anemia of chronic kidney disease. No active bleeding reported. Monitor.  7.  Schizoaffective disorder. Patient is on multiple psychotropic medication which we will continue for now.  8.  Hypothyroidism. Continue Synthroid.  Diet: Renal diet DVT Prophylaxis: Therapeutic Anticoagulation with Heparin  Advance goals of care discussion: Full code. See above for goals of care discussion. Palliative care consulted.  Family Communication: family was present at bedside, at the time of interview. Opportunity was given to ask question and all questions were answered satisfactorily.   Disposition:  Discharge to Home.  Consultants: cardiology Palliative care  Procedures: Echocardiogram   Scheduled Meds: . aspirin EC  81 mg Oral Daily  . atorvastatin  40 mg Oral q1800  . carvedilol  25 mg Oral BID  . clopidogrel  75 mg Oral q1800  . fluticasone  1 spray Each Nare q1800  . insulin aspart  0-5 Units Subcutaneous QHS  . insulin aspart  0-9 Units Subcutaneous TID WC  . ipratropium-albuterol  3 mL Nebulization Q6H  . levothyroxine  50 mcg Oral Q0600  . loratadine  10 mg Oral Daily  . methylPREDNISolone (SOLU-MEDROL) injection  60 mg Intravenous Q12H  . OLANZapine  15 mg Oral QHS  . sodium bicarbonate  650 mg Oral BID  . terazosin  5 mg  Oral QHS   Continuous Infusions: . azithromycin Stopped (03/28/19 1114)  . cefTRIAXone (ROCEPHIN)  IV 2 g (03/28/19 0829)  . heparin 600 Units/hr (03/28/19 1400)   PRN Meds:  Antibiotics: Anti-infectives (From admission, onward)   Start     Dose/Rate Route Frequency Ordered Stop   03/02/2019 0900  cefTRIAXone (ROCEPHIN) 2 g in sodium chloride 0.9 % 100 mL IVPB     2 g 200 mL/hr over 30 Minutes Intravenous Every 24 hours 02/26/2019 0859     03/15/2019 0900  azithromycin (ZITHROMAX) 500 mg in sodium chloride 0.9 % 250 mL IVPB     500 mg 250 mL/hr over 60 Minutes Intravenous Every 24 hours 03/13/2019 0859         Objective: Physical Exam: Vitals:   03/28/19 0447 03/28/19 0743 03/28/19 1133 03/28/19 1239  BP: 139/76  120/65 140/72  Pulse: 88  80 78  Resp: 20   18  Temp: 98.9 F (37.2 C)   97.6 F (36.4 C)  TempSrc: Oral   Oral  SpO2: 99% 99%  99%  Weight: 52.4 kg     Height:        Intake/Output Summary (Last 24 hours) at 03/28/2019 1857 Last data filed at 03/28/2019 1739 Gross per 24 hour  Intake 632.48 ml  Output 500 ml  Net 132.48 ml   Filed Weights   03/02/2019 0741 03/02/2019 1341 03/28/19 0447  Weight: 49 kg 53.2 kg 52.4 kg   General: alert and oriented to time and place. Appear in mild distress, affect anxious Eyes: PERRL, Conjunctiva normal ENT: Oral Mucosa Clear, dry  Neck: positive JVD, no Abnormal Mass Or lumps Cardiovascular: S1 and S2 Present, no Murmur, peripheral pulses symmetrical Respiratory: increased respiratory effort, Bilateral Air entry equal and Decreased, no use of accessory muscle, bilateral  Crackles, no wheezes Abdomen: Bowel Sound present, Soft and no tenderness, no hernia Skin: no rashes  Extremities: trace Pedal edema, no calf tenderness Neurologic: normal without focal findings, mental status, speech normal, alert and oriented x3, PERLA, Motor strength 5/5 and symmetric and sensation grossly normal to light touch Gait not checked due to patient  safety concerns  Data Reviewed: CBC: Recent Labs  Lab 03/18/2019 0835 03/28/19 0042  WBC 14.3* 14.5*  NEUTROABS 12.4*  --   HGB 9.9* 9.7*  HCT 32.5* 29.8*  MCV 101.9* 97.4  PLT 199 287   Basic Metabolic Panel: Recent Labs  Lab 03/26/2019 0835 03/22/2019 1533 03/28/19 0042  NA 136 137 138  K 5.5* 5.5* 4.4  CL 108 109 107  CO2 16* 14* 18*  GLUCOSE 212* 176* 184*  BUN 40* 40* 45*  CREATININE 2.58* 2.59* 2.76*  CALCIUM 8.9 8.6* 8.6*   Liver Function Tests: No results for input(s): AST, ALT, ALKPHOS, BILITOT, PROT, ALBUMIN in the last 168 hours. No results for input(s): LIPASE, AMYLASE in the last 168 hours. No results for input(s): AMMONIA in the last 168 hours. Coagulation Profile: No results for input(s): INR, PROTIME in the last 168 hours. Cardiac Enzymes: No results for input(s): CKTOTAL, CKMB, CKMBINDEX, TROPONINI in the last 168 hours. BNP (last 3 results) No results for input(s): PROBNP in the last 8760 hours. CBG: Recent Labs  Lab 03/21/2019 2143 03/28/19 0759 03/28/19 1112 03/28/19 1638  GLUCAP 263*  199* 249* 230*   Studies: No results found.   Time spent: 35 minutes  Author: Berle Mull, MD [facility] 03/28/2019 6:57 PM  To reach On-call, see care teams to locate the attending and reach out to them via [email or web address]. If 7PM-7AM, please contact night-coverage If you still have difficulty reaching the attending provider, please page the [facility] (Director on Call) for [facility] on amion for assistance.

## 2019-03-28 NOTE — Progress Notes (Signed)
CRITICAL VALUE ALERT  Critical Value:  Troponin ZK:[id number]  Date & Time Notied:  03/28/2019, 0125  Provider Notified: Reece Levy, MD  Orders Received/Actions taken: awaiting any new orders.  Blain Pais, RN was notified and paged MD.

## 2019-03-28 NOTE — Progress Notes (Signed)
Inpatient Diabetes Program Recommendations  AACE/ADA: New Consensus Statement on Inpatient Glycemic Control (2015)  Target Ranges:  Prepandial:   less than 140 mg/dL      Peak postprandial:   less than 180 mg/dL (1-2 hours)      Critically ill patients:  140 - 180 mg/dL   Results for Ashley Velez, Ashley Velez (MRN [id number]) as of 03/28/2019 12:39  Ref. Range 03/12/2019 21:43 03/28/2019 07:59 03/28/2019 11:12  Glucose-Capillary Latest Ref Range: 70 - 99 mg/dL 263 (H)  3 units NOVOLOG  199 (H)  2 units NOVOLOG  249 (H)  2 units NOVOLOG    Results for Ashley Velez, Ashley Velez (MRN [id number]) as of 03/28/2019 12:39  Ref. Range 03/14/2019 14:13  Hemoglobin A1C Latest Ref Range: 4.8 - 5.6 % 6.3 (H)     Admit NSTEMI/ SOB/ CHF  History: DM, CKD 4, Schizoaffective d/o, CVA   Home DM Meds: Glipizide 5 mg Daily  Current Orders: Novolog Sensitive Correction Scale/ SSI (0-9 units) TID AC + HS      Solumedrol 60 mg BID started yest afternoon.  CBGs elevated today likely due to the effects of the steroids.     MD- If pt to remain on steroids,pleae consider the following:  Start Lantus 8 units QHS (0.15 units/kg)      --Will follow patient during hospitalization--  Wyn Quaker RN, MSN, CDE Diabetes Coordinator Inpatient Glycemic Control Team Team Pager: [phone] (8a-5p)

## 2019-03-28 NOTE — Progress Notes (Signed)
Patient has only had 392ml of urine output through foley catheter this shift. Dr. Posey Pronto made aware. Foley is draining and is not kinked or bent. Pt has no complaints at this time. Will continue with plan of care.

## 2019-03-28 NOTE — Consult Note (Addendum)
Cardiology Consultation:   Patient ID: Ashley Velez MRN: [id number]; DOB: 09/19/1934  Admit date: 03/06/2019 Date of Consult: 03/28/2019  Primary Care Provider: Crist Infante, MD Primary Cardiologist: No primary care provider on file. New Primary Electrophysiologist:  None    Patient Profile:   Ashley Velez is a 84 y.o. female with a hx of DM, HTN, HLD, CKD-4, schizoaffective disorder, hypothyroidism, and stroke who is being seen today for the evaluation of elevated troponin after admit for hypoxia,  CHF with BNP of 1026 at the request of Dr. Posey Pronto.  History of Present Illness:   Ashley Velez with above hx and now admitted yesterday after presenting from [facility] place with SOB, wheezing and hypoxia.  She was confused on admit.  No fever.  + DOE and at rest.   Some chest discomfort associated with the SOB.  No prior cardiac issues.  She did have an echo in 2014 with EF 55-65%, G1DD, trivial AR, mild MR, trivial pericardial effusion -done for CVA.   Hx of carotid disease as well in 2014 with Rt ICA <40% and Lt ICA 40-59% stenosis.   Echo attempted yesterday but pt unable to lie back due to dyspnea.  BP was elevated on admit 169/93  She had some chest pain but she is very poor historian.  EKG:  The EKG was personally reviewed and demonstrates:  SR at 88 T wave inversion in V2 new from 2018.   Today's EKG similar to yesterday Telemetry:  Telemetry was personally reviewed and demonstrates:  SR to ST  Troponin HS  3,824; 3608; 3686; 3588  BNP 1026.3 Na 135, K+ 5.5 glucose 212, Cr. 2.58  And today Cr 2.76  ( last Cr before admit was 03/2017 and Cr 1.44)  Hgb today 9.7 with WBC 14.5 and plts 212  TSH 0.910  Blood cultures pending   Chest CTA 02/28/2019 IMPRESSION: 1. Moderate bilateral central ground-glass/airspace opacities, favor edema over infection. 2. Moderate bilateral pleural effusions with bibasilar atelectasis. 3. Cardiomegaly and coronary disease. 4.  Aortic  Atherosclerosis (ICD10-I70.0).  PCXR 02/28/2019 IMPRESSION: New bibasilar pulmonary infiltrates question pneumonia versus aspiration. Small LEFT pleural effusion.  Renal u/s 02/25/2019 IMPRESSION: 1. Small hyperechoic kidneys compatible with chronic medical renal disease. No evidence of hydronephrosis. 2. Bilateral renal cysts  Currently BP 139/76 P 80  Sitting up in bed and comfortable. No pain or SOB now.     Heart Pathway Score:     Past Medical History:  Diagnosis Date   Acute respiratory failure ([place]) 09/2016   Diabetes mellitus without complication (HCC)    Epilepsy ([place])    Hyperlipidemia    Hypertension    Renal disorder    Stage 4 CKD   Schizoaffective disorder ([place])    Stroke ([place])    Thyroid disease    hypothyroid   Vitamin D deficiency     History reviewed. No pertinent surgical history.   Home Medications:  Prior to Admission medications   Medication Sig Start Date End Date Taking? Authorizing Provider  carvedilol (COREG) 25 MG tablet Take 25 mg by mouth 2 (two) times daily. 03/05/19  Yes [provider]  clopidogrel (PLAVIX) 75 MG tablet Take 75 mg by mouth daily.  08/22/15  Yes [provider]  cyanocobalamin (,VITAMIN B-12,) 1000 MCG/ML injection Inject 1,000 mcg into the muscle every 30 (thirty) days. On the 28th   Yes [provider]  felodipine (PLENDIL) 5 MG 24 hr tablet Take 5 mg by mouth daily. 03/26/19  Yes  [provider]  ferrous sulfate 325 (65 FE) MG tablet Take 325 mg by mouth daily with breakfast.   Yes [provider]  fluticasone (FLONASE) 50 MCG/ACT nasal spray Place 1 spray into both nostrils daily.   Yes [provider]  glipiZIDE (GLUCOTROL) 5 MG tablet Take 5 mg by mouth daily. 03/10/19  Yes [provider]  hydrALAZINE (APRESOLINE) 25 MG tablet Take 25 mg by mouth 3 (three) times daily.   Yes [provider]  ketoconazole (NIZORAL) 2 % shampoo Apply 1  application topically as directed. Tuesday and fridays with showers 03/09/19  Yes [provider]  levothyroxine (LEVOXYL) 50 MCG tablet Take 50 mcg by mouth daily before breakfast.   Yes [provider]  lisinopril (ZESTRIL) 20 MG tablet Take 20 mg by mouth 2 (two) times daily. 03/16/19  Yes [provider]  loratadine (CLARITIN) 10 MG tablet Take 10 mg by mouth daily.   Yes [provider]  Multiple Vitamin (MULTIVITAMIN WITH MINERALS) TABS tablet Take 1 tablet by mouth daily.   Yes [provider]  OLANZapine (ZYPREXA) 15 MG tablet Take 15 mg by mouth at bedtime. 03/26/19  Yes [provider]  sodium bicarbonate 650 MG tablet Take 650 mg by mouth 2 (two) times daily.   Yes [provider]  terazosin (HYTRIN) 5 MG capsule Take 5 mg by mouth at bedtime. 02/28/19  Yes [provider]  vitamin C (ASCORBIC ACID) 500 MG tablet Take 500 mg by mouth daily.   Yes [provider]  Vitamin D, Ergocalciferol, (DRISDOL) 50000 UNITS CAPS capsule Take 50,000 Units by mouth as directed. Monthly on the 19th 12/31/14  Yes [provider]  felodipine (PLENDIL) 10 MG 24 hr tablet Take 1 tablet (10 mg total) by mouth daily. Patient not taking: Reported on 03/02/2019 01/12/17   Dhungel, Flonnie Overman, MD  hydrALAZINE (APRESOLINE) 50 MG tablet Take 1 tablet (50 mg total) by mouth every 8 (eight) hours. Patient not taking: Reported on 03/17/2019 03/27/17   Thurnell Lose, MD  Maltodextrin-Xanthan Gum (RESOURCE THICKENUP CLEAR) POWD Take 120 g by mouth as needed (with all liquids). Patient not taking: Reported on 08/15/202018 01/12/17   Louellen Molder, MD    Inpatient Medications: Scheduled Meds:  carvedilol  25 mg Oral BID   clopidogrel  75 mg Oral q1800   enoxaparin (LOVENOX) injection  30 mg Subcutaneous Q24H   fluticasone  1 spray Each Nare q1800   insulin aspart  0-5 Units Subcutaneous QHS   insulin aspart  0-9 Units Subcutaneous  TID WC   ipratropium-albuterol  3 mL Nebulization Q6H   levothyroxine  50 mcg Oral Q0600   loratadine  10 mg Oral Daily   methylPREDNISolone (SOLU-MEDROL) injection  60 mg Intravenous Q12H   OLANZapine  15 mg Oral QHS   sodium bicarbonate  650 mg Oral BID   terazosin  5 mg Oral QHS   Continuous Infusions:  azithromycin Stopped (03/16/2019 1212)   cefTRIAXone (ROCEPHIN)  IV Stopped (03/27/19 1046)   PRN Meds:   Allergies:    Allergies  Allergen Reactions   Fosamax [Alendronate] Other (See Comments)    Noted to be an allergy on the patient's paperwork from facility, but no reaction is recorded   Macrodantin [Nitrofurantoin] Other (See Comments)    Noted to be an allergy on the patient's paperwork from facility, but no reaction is recorded    Social History:   Social History   Socioeconomic History  Marital status: Married    Spouse name: Not on file   Number of children: Not on file   Years of education: Not on file   Highest education level: Not on file  Occupational History   Not on file  Social Needs   Financial resource strain: Not on file   Food insecurity    Worry: Not on file    Inability: Not on file   Transportation needs    Medical: Not on file    Non-medical: Not on file  Tobacco Use   Smoking status: Never Smoker   Smokeless tobacco: Never Used  Substance and Sexual Activity   Alcohol use: No   Drug use: No   Sexual activity: Not on file  Lifestyle   Physical activity    Days per week: Not on file    Minutes per session: Not on file   Stress: Not on file  Relationships   Social connections    Talks on phone: Not on file    Gets together: Not on file    Attends religious service: Not on file    Active member of club or organization: Not on file    Attends meetings of clubs or organizations: Not on file    Relationship status: Not on file   Intimate partner violence    Fear of current or ex partner: Not on file     Emotionally abused: Not on file    Physically abused: Not on file    Forced sexual activity: Not on file  Other Topics Concern   Not on file  Social History Narrative   Not on file    Family History:   Parents without CAD nor her siblings    ROS:  Please see the history of present illness.  General:no colds or fevers, no weight changes Skin:no rashes or ulcers HEENT:no blurred vision, no congestion CV:see HPI PUL:see HPI GI:no diarrhea constipation or melena, no indigestion GU:no hematuria, no dysuria MS:no joint pain, no claudication Neuro:no syncope, no lightheadedness, hx CVA Endo:no diabetes, no thyroid disease  All other ROS reviewed and negative.     Physical Exam/Data:   Vitals:   03/13/2019 2106 03/28/19 0104 03/28/19 0447 03/28/19 0743  BP: (!) 163/86  139/76   Pulse: 92  88   Resp: 18  20   Temp: 98.4 F (36.9 C)  98.9 F (37.2 C)   TempSrc: Oral  Oral   SpO2: 100% 98% 99% 99%  Weight:   52.4 kg   Height:        Intake/Output Summary (Last 24 hours) at 03/28/2019 0828 Last data filed at 03/28/2019 0458 Gross per 24 hour  Intake 350 ml  Output 401 ml  Net -51 ml   Last 3 Weights 03/28/2019 02/28/2019 03/05/2019  Weight (lbs) 115 lb 9.6 oz 117 lb 4.8 oz 108 lb 0.4 oz  Weight (kg) 52.436 kg 53.207 kg 49 kg     Body mass index is 20.48 kg/m.  General: frail female, in no acute distress eating breakfast HEENT: normal Lymph: no adenopathy Neck: no JVD while sitting up in bed Endocrine:  No thryomegaly Vascular: No carotid bruits; pedal pulses 1+ bilaterally  Cardiac:  normal S1, S2; RRR; no murmur gallup rub or click Lungs:  diminished to auscultation bilaterally, no wheezing, rhonchi + rales  Abd: soft, nontender, no hepatomegaly  Ext: no edema Musculoskeletal:  No deformities, BUE and BLE strength normal and equal Skin: warm and dry  Neuro:  Poor  historian, difficult in answering questions., no focal abnormalities noted Psych:  Normal affect     Relevant CV Studies: Echo 05/2013   Study Conclusions   - Left ventricle: The cavity size was normal. Wall thickness  was increased in a pattern of mild LVH. There was mild  focal basal hypertrophy of the septum. Systolic function  was normal. The estimated ejection fraction was in the  range of 55% to 65%. Wall motion was normal; there were no  regional wall motion abnormalities. Doppler parameters are  consistent with abnormal left ventricular relaxation  (grade 1 diastolic dysfunction).  - Aortic valve: Trivial regurgitation.  - Mitral valve: Calcified annulus. Mild regurgitation.  - Pericardium, extracardiac: A trivial pericardial effusion  was identified.   Laboratory Data:  High Sensitivity Troponin:   Recent Labs  Lab 03/26/2019 1413 03/23/2019 1533 03/28/19 0042 03/28/19 0538  TROPONINIHS 3,824* 3,608* 3,696* 3,588*     Chemistry Recent Labs  Lab 03/01/2019 0835 03/03/2019 1533 03/28/19 0042  NA 136 137 138  K 5.5* 5.5* 4.4  CL 108 109 107  CO2 16* 14* 18*  GLUCOSE 212* 176* 184*  BUN 40* 40* 45*  CREATININE 2.58* 2.59* 2.76*  CALCIUM 8.9 8.6* 8.6*  GFRNONAA 17* 17* 15*  GFRAA 19* 19* 18*  ANIONGAP 12 14 13     No results for input(s): PROT, ALBUMIN, AST, ALT, ALKPHOS, BILITOT in the last 168 hours. Hematology Recent Labs  Lab 03/14/2019 0835 03/19/2019 1413 03/28/19 0042  WBC 14.3*  --  14.5*  RBC 3.19* 3.19* 3.06*  HGB 9.9*  --  9.7*  HCT 32.5*  --  29.8*  MCV 101.9*  --  97.4  MCH 31.0  --  31.7  MCHC 30.5  --  32.6  RDW 13.7  --  13.7  PLT 199  --  212   BNP Recent Labs  Lab 03/20/2019 0835  BNP 1,026.3*    DDimer No results for input(s): DDIMER in the last 168 hours.   Radiology/Studies:  Ct Chest Wo Contrast  Result Date: 02/27/2019 CLINICAL DATA:  84 year old female with acute shortness of breath. EXAM: CT CHEST WITHOUT CONTRAST TECHNIQUE: Multidetector CT imaging of the chest was performed following the standard  protocol without IV contrast. COMPARISON:  02/25/2019 radiograph, 12/30/2016 chest CT and prior studies FINDINGS: Cardiovascular: Mild cardiomegaly noted. Heavy coronary artery atherosclerotic calcifications are present. Aortic atherosclerotic calcifications are noted without thoracic aortic aneurysm. A small pericardial effusion is present. Mediastinum/Nodes: No mediastinal mass or enlarged lymph nodes. The visualized esophagus is unremarkable. Thyroid calcifications are unchanged. Lungs/Pleura: Moderate bilateral pleural effusions are noted. Central ground-glass/airspace opacities bilaterally are present. Bilateral LOWER lung atelectasis is noted. No definite mass identified. No evidence of pneumothorax. Upper Abdomen: No acute abnormality identified. Musculoskeletal: No acute or suspicious bony abnormality. IMPRESSION: 1. Moderate bilateral central ground-glass/airspace opacities, favor edema over infection. 2. Moderate bilateral pleural effusions with bibasilar atelectasis. 3. Cardiomegaly and coronary disease. 4.  Aortic Atherosclerosis (ICD10-I70.0). Electronically Signed   By: Margarette Canada M.D.   On: 02/28/2019 15:02   US Renal  Result Date: 03/13/2019 CLINICAL DATA:  84 year old female with acute kidney injury. EXAM: RENAL / URINARY TRACT ULTRASOUND COMPLETE COMPARISON:  01/09/2017 ultrasound FINDINGS: Right Kidney: Renal measurements: 8.7 x 3.8 x 3.7 cm = volume: 65 mL. Increased echogenicity noted. Scattered cysts are present. No hydronephrosis. Left Kidney: Renal measurements: 7.4 x 3.5 x 3.7 cm = volume: 47 mL. Increased echogenicity noted. Scattered cysts are present. No hydronephrosis. Bladder: Appears normal  for degree of bladder distention. IMPRESSION: 1. Small hyperechoic kidneys compatible with chronic medical renal disease. No evidence of hydronephrosis. 2. Bilateral renal cysts. Electronically Signed   By: Margarette Canada M.D.   On: 03/03/2019 16:47   Dg Chest Port 1 View  Result Date:  03/11/2019 CLINICAL DATA:  Shortness of breath, wheezing, low oxygen saturation, chronic cough, hypertension, stroke, diabetes mellitus EXAM: PORTABLE CHEST 1 VIEW COMPARISON:  Portable exam 0829 hours compared to earlier two-view chest exam of 0153 hours FINDINGS: Normal heart size, mediastinal contours, and pulmonary vascularity. Atherosclerotic calcification aorta. Small LEFT pleural effusion. New bibasilar infiltrates since earlier exam could represent aspiration or developing pneumonia. Upper lungs clear. No pneumothorax. Bones demineralized. IMPRESSION: New bibasilar pulmonary infiltrates question pneumonia versus aspiration. Small LEFT pleural effusion. Electronically Signed   By: Lavonia Dana M.D.   On: 03/19/2019 08:43    Assessment and Plan:   1. NSTEMI with troponin > 3000 may have been late presentation.  No prior cardiac issues.  New EKG changes. prob septal MI    -  IV heparin, she is anemic would add heparin and ASA.  She is on plavix for CVA.  Will need to monitor Hgb.  Pt would be for cath but with CKD 4 she may need dialysis after cath. Dr. Gardiner Rhyme to see.  Echo pending.  On CTA of chest + CAD + cardiomegaly.   2.  acute respiratory failure with hypoxia -no hx of COPD or asthma, may have PNA but + CHF.  She is on ABX and rec'd 1 dose of lasix.   She is neg 51 ml.  Her wt 53.2 Kg on admit and today 52.4 Kg.   Will defer to Dr. Gardiner Rhyme but may benefit from another dose of lasix. 3. HTN improved control  outpt meds coreg 25 mg BID, plendil 5 mg daily, hydralazine 25 mg TID, zestril 20 BID, hytrin 5 mg daily  4. CKD 4 ?chronic last one we have from 2018 Cr 1.44.   Renal ultrasound normal.  5. DM per primary team  6. Schizoaffective disorder per primary team.       For questions or updates, please contact [place] Please consult [email or web address] for contact info under     Signed, Cecilie Kicks, NP  03/28/2019 8:28 AM   Patient seen and examined.  Agree with above documentation.   She is an 84 y.o. female with a hx of DM, HTN, HLD, CKD-4, schizoaffective disorder, hypothyroidism, and stroke who is being seen today for the evaluation of NSTEMI after admit for hypoxia,  CHF with BNP of 1026 at the request of Dr. Posey Pronto.  She is oriented to person only and, while she answers questions and follwos commands, is not able to give a history.  She denies any chest pain currently but said she had some 2 days ago.  On exam, she is oriented to person only.  Bibasilar crackles.  RRR, 2/6 systolic mumur. EKG personally reviewed: normal sinus rhythm, septal Q waves, poor R wave progression.  Telemetry personally reviewed, which shows sinus rhythm with rate 70-90s, no events.  Labs notable for SCr 2.76.  HsTn peak 3824.  For her NSTEMI, she is not a candidate for invasive evaluation given frailty, oriented to person only, and AKI.  Would recommend medical management.  She is currently on ASA + plavix, will start heparin gtt.  She is not on a statin, will check lipid panel and start atorvastatin.  TTE pending.  For her acute  respiratory failure with hypoxia, likely pulmonary edema related to NSTEMI.  Will diurese with IV lasix 80 mg, can titrate for goal net negative 1-2L today.

## 2019-03-28 NOTE — Plan of Care (Signed)
Pts. Condition will continue to improve 

## 2019-03-28 NOTE — NC FL2 (Signed)
[place] LEVEL OF CARE SCREENING TOOL     IDENTIFICATION  Patient Name: Ashley Velez Birthdate: 1934-10-21 Sex: female Admission Date (Current Location): 03/04/2019  [facility] and [place] Number:  Herbalist and Address:         Provider Number: [id number]  Attending Physician Name and Address:  Lavina Hamman, MD  Relative Name and Phone Number:  [place] [phone]    Current Level of Care: Hospital Recommended Level of Care: [place] Prior Approval Number:    Date Approved/Denied:   PASRR Number:    Discharge Plan: SNF    Current Diagnoses: Patient Active Problem List   Diagnosis Date Noted  . Acute encephalopathy 008/23/202018  . Hypoglycemia 01/12/2017  . Status epilepticus ([place]) 01/12/2017  . AKI (acute kidney injury) ([place]) 01/12/2017  . Aspiration pneumonia ([place]) 01/12/2017  . Uncontrolled hypertension 01/12/2017  . Acute respiratory failure with hypoxia ([place])   . Seizure ([place]) 01/06/2017    Orientation RESPIRATION BLADDER Height & Weight     Self  Normal Incontinent(foley catheter-urine retention) Weight: 52.4 kg Height:  5\' 3"  (160 cm)  BEHAVIORAL SYMPTOMS/MOOD NEUROLOGICAL BOWEL NUTRITION STATUS      Incontinent Diet(carb mod diet)  AMBULATORY STATUS COMMUNICATION OF NEEDS Skin   Limited Assist Verbally Normal                       Personal Care Assistance Level of Assistance  Bathing, Feeding, Dressing Bathing Assistance: Limited assistance Feeding assistance: Limited assistance Dressing Assistance: Limited assistance     Functional Limitations Info  Sight, Hearing Sight Info: Adequate Hearing Info: Adequate      SPECIAL CARE FACTORS FREQUENCY  PT (By licensed PT), OT (By licensed OT)     PT Frequency: 5x week OT Frequency: 5x week            Contractures Contractures Info: Not present    Additional Factors Info  Code Status, Allergies Code Status Info: Full  code Allergies Info: Fosamax Alendronate, Macrodantin Nitrofurantoin           Current Medications (03/28/2019):  This is the current hospital active medication list Current Facility-Administered Medications  Medication Dose Route Frequency Provider Last Rate Last Dose  . aspirin EC tablet 81 mg  81 mg Oral Daily Cecilie Kicks R, NP      . azithromycin (ZITHROMAX) 500 mg in sodium chloride 0.9 % 250 mL IVPB  500 mg Intravenous Q24H Hosie Poisson, MD 250 mL/hr at 03/28/19 1014 500 mg at 03/28/19 1014  . carvedilol (COREG) tablet 25 mg  25 mg Oral BID Hosie Poisson, MD   25 mg at 02/26/2019 2117  . cefTRIAXone (ROCEPHIN) 2 g in sodium chloride 0.9 % 100 mL IVPB  2 g Intravenous Q24H Hosie Poisson, MD 200 mL/hr at 03/28/19 0829 2 g at 03/28/19 0829  . clopidogrel (PLAVIX) tablet 75 mg  75 mg Oral q1800 Hosie Poisson, MD   75 mg at 02/28/2019 1921  . fluticasone (FLONASE) 50 MCG/ACT nasal spray 1 spray  1 spray Each Nare [id number] Hosie Poisson, MD   1 spray at 03/27/19 1921  . heparin ADULT infusion 100 units/mL (25000 units/234mL sodium chloride 0.45%)  600 Units/hr Intravenous Continuous Polly Cobia, RPH      . heparin bolus via infusion 3,000 Units  3,000 Units Intravenous Once Polly Cobia, RPH      . insulin aspart (novoLOG) injection 0-5 Units  0-5 Units  Subcutaneous QHS Hosie Poisson, MD   3 Units at 03/20/2019 2219  . insulin aspart (novoLOG) injection 0-9 Units  0-9 Units Subcutaneous TID WC Hosie Poisson, MD   2 Units at 03/28/19 [phone]  . ipratropium-albuterol (DUONEB) 0.5-2.5 (3) MG/3ML nebulizer solution 3 mL  3 mL Nebulization Q6H Hosie Poisson, MD   3 mL at 03/28/19 0743  . levothyroxine (SYNTHROID) tablet 50 mcg  50 mcg Oral Q0600 Hosie Poisson, MD   50 mcg at 03/28/19 0539  . loratadine (CLARITIN) tablet 10 mg  10 mg Oral Daily Hosie Poisson, MD   10 mg at 03/21/2019 1537  . methylPREDNISolone sodium succinate (SOLU-MEDROL) 125 mg/2 mL injection 60 mg  60 mg Intravenous Q12H Hosie Poisson, MD   60 mg at 03/13/2019 2341  . OLANZapine (ZYPREXA) tablet 15 mg  15 mg Oral QHS Hosie Poisson, MD   15 mg at 03/14/2019 2117  . sodium bicarbonate tablet 650 mg  650 mg Oral BID Hosie Poisson, MD   650 mg at 02/26/2019 2117  . terazosin (HYTRIN) capsule 5 mg  5 mg Oral QHS Hosie Poisson, MD   5 mg at 03/03/2019 2118     Discharge Medications: Please see discharge summary for a list of discharge medications.  Relevant Imaging Results:  Relevant Lab Results:   Additional Information ss#[id number] [place], [place]

## 2019-03-28 NOTE — Progress Notes (Addendum)
Ashley Velez for IV heparin Indication: chest pain/ACS  Allergies  Allergen Reactions  . Fosamax [Alendronate] Other (See Comments)    Noted to be an allergy on the patient's paperwork from facility, but no reaction is recorded  . Macrodantin [Nitrofurantoin] Other (See Comments)    Noted to be an allergy on the patient's paperwork from facility, but no reaction is recorded    Patient Measurements: Height: 5\' 3"  (160 cm) Weight: 115 lb 9.6 oz (52.4 kg) IBW/kg (Calculated) : 52.4 Heparin Dosing Weight: 52 kg  Vital Signs: Temp: 98.9 F (37.2 C) (09/01 0447) Temp Source: Oral (09/01 0447) BP: 139/76 (09/01 0447) Pulse Rate: 88 (09/01 0447)  Labs: Recent Labs    03/01/2019 0835  03/01/2019 1533 03/28/19 0042 03/28/19 0538  HGB 9.9*  --   --  9.7*  --   HCT 32.5*  --   --  29.8*  --   PLT 199  --   --  212  --   CREATININE 2.58*  --  2.59* 2.76*  --   TROPONINIHS  --    < > 3,608* 3,696* 3,588*   < > = values in this interval not displayed.    Estimated Creatinine Clearance: 13 mL/min (A) (by C-G formula based on SCr of 2.76 mg/dL (H)).   Medical History: Past Medical History:  Diagnosis Date  . Acute respiratory failure ([place]) 09/2016  . Diabetes mellitus without complication ([place])   . Epilepsy ([place])   . Hyperlipidemia   . Hypertension   . Renal disorder    Stage 4 CKD  . Schizoaffective disorder ([place])   . Stroke ([place])   . Thyroid disease    hypothyroid  . Vitamin D deficiency     Medications:  Medications Prior to Admission  Medication Sig Dispense Refill Last Dose  . carvedilol (COREG) 25 MG tablet Take 25 mg by mouth 2 (two) times daily.   03/26/2019 at 830pm  . clopidogrel (PLAVIX) 75 MG tablet Take 75 mg by mouth daily.   1 03/26/2019 at 5pm  . cyanocobalamin (,VITAMIN B-12,) 1000 MCG/ML injection Inject 1,000 mcg into the muscle every 30 (thirty) days. On the 28th   03/24/2019 at 7pm  . felodipine (PLENDIL) 5 MG 24 hr  tablet Take 5 mg by mouth daily.   03/26/2019 at 930am  . ferrous sulfate 325 (65 FE) MG tablet Take 325 mg by mouth daily with breakfast.   03/26/2019 at 930am  . fluticasone (FLONASE) 50 MCG/ACT nasal spray Place 1 spray into both nostrils daily.   03/26/2019 at 5pm  . glipiZIDE (GLUCOTROL) 5 MG tablet Take 5 mg by mouth daily.   03/26/2019 at 530am  . hydrALAZINE (APRESOLINE) 25 MG tablet Take 25 mg by mouth 3 (three) times daily.   03/26/2019 at 830pm  . ketoconazole (NIZORAL) 2 % shampoo Apply 1 application topically as directed. Tuesday and fridays with showers   03/24/2019 at 830am  . levothyroxine (LEVOXYL) 50 MCG tablet Take 50 mcg by mouth daily before breakfast.   03/26/2019 at 530am  . lisinopril (ZESTRIL) 20 MG tablet Take 20 mg by mouth 2 (two) times daily.   03/26/2019 at 830pm  . loratadine (CLARITIN) 10 MG tablet Take 10 mg by mouth daily.   03/26/2019 at 930am  . Multiple Vitamin (MULTIVITAMIN WITH MINERALS) TABS tablet Take 1 tablet by mouth daily.   03/26/2019 at 5pm  . OLANZapine (ZYPREXA) 15 MG tablet Take 15 mg by mouth at bedtime.  03/26/2019 at 830pm  . sodium bicarbonate 650 MG tablet Take 650 mg by mouth 2 (two) times daily.   03/26/2019 at 830pm  . terazosin (HYTRIN) 5 MG capsule Take 5 mg by mouth at bedtime.   03/26/2019 at 930pm  . vitamin C (ASCORBIC ACID) 500 MG tablet Take 500 mg by mouth daily.   03/26/2019 at 930am  . Vitamin D, Ergocalciferol, (DRISDOL) 50000 UNITS CAPS capsule Take 50,000 Units by mouth as directed. Monthly on the 19th  1 03/15/2019 at 8am  . felodipine (PLENDIL) 10 MG 24 hr tablet Take 1 tablet (10 mg total) by mouth daily. (Patient not taking: Reported on 03/18/2019) 30 tablet 0 Not Taking at Unknown time  . hydrALAZINE (APRESOLINE) 50 MG tablet Take 1 tablet (50 mg total) by mouth every 8 (eight) hours. (Patient not taking: Reported on 03/06/2019) 90 tablet 0 Not Taking at Unknown time  . Maltodextrin-Xanthan Gum (RESOURCE THICKENUP CLEAR) POWD Take 120 g by  mouth as needed (with all liquids). (Patient not taking: Reported on 03/26/2017) 10 Can 0 Not Taking at Unknown time   Scheduled:  . aspirin EC  81 mg Oral Daily  . carvedilol  25 mg Oral BID  . clopidogrel  75 mg Oral q1800  . fluticasone  1 spray Each Nare q1800  . heparin  3,000 Units Intravenous Once  . insulin aspart  0-5 Units Subcutaneous QHS  . insulin aspart  0-9 Units Subcutaneous TID WC  . ipratropium-albuterol  3 mL Nebulization Q6H  . levothyroxine  50 mcg Oral Q0600  . loratadine  10 mg Oral Daily  . methylPREDNISolone (SOLU-MEDROL) injection  60 mg Intravenous Q12H  . OLANZapine  15 mg Oral QHS  . sodium bicarbonate  650 mg Oral BID  . terazosin  5 mg Oral QHS    Assessment: 7 yoF with PMH DM2, HTN, HLD, CKD4, hypothyroid, CVA on Plavix, admitted with hypoxia. Now with troponins elevated 100x ULN, Cardiology requesting IV heparin with Pharmacy to dose.   Baseline INR, aPTT: not done  Anticoagulation prior to admission: none  Last dose Lovenox ppx at 10p yesterday (8/31)  Plavix PTA, Cards adding ASA 81 now with UFH  Significant events:  Today, 03/28/2019:  CBC: Hgb low but stable; Plt stable WNL  SCr elevated above baseline (baseline ~1.5-2.0) and trending up  No bleeding or infusion issues per nursing  Goal of Therapy: Heparin level 0.3-0.7 units/ml Monitor platelets by anticoagulation protocol: Yes  Plan:  Stop Lovenox  Heparin 3000 units IV bolus x 1  Heparin 600 units/hr IV infusion (slightly less than 12 units/kg/hr d/t poor renal function and concomitant ASA/Plavix)  Check heparin level 8 hrs after start  Daily CBC, daily heparin level once stable  Monitor for signs of bleeding or thrombosis   Reuel Boom, PharmD, BCPS [phone] 03/28/2019, 10:17 AM

## 2019-03-28 DEATH — deceased

## 2019-03-29 DIAGNOSIS — Z515 Encounter for palliative care: Secondary | ICD-10-CM

## 2019-03-29 DIAGNOSIS — Z7189 Other specified counseling: Secondary | ICD-10-CM

## 2019-03-29 DIAGNOSIS — I5021 Acute systolic (congestive) heart failure: Secondary | ICD-10-CM

## 2019-03-29 DIAGNOSIS — I214 Non-ST elevation (NSTEMI) myocardial infarction: Secondary | ICD-10-CM

## 2019-03-29 LAB — BASIC METABOLIC PANEL WITH GFR
Anion gap: 12 (ref 5–15)
BUN: 65 mg/dL — ABNORMAL HIGH (ref 8–23)
CO2: 20 mmol/L — ABNORMAL LOW (ref 22–32)
Calcium: 8.2 mg/dL — ABNORMAL LOW (ref 8.9–10.3)
Chloride: 108 mmol/L (ref 98–111)
Creatinine, Ser: 3.36 mg/dL — ABNORMAL HIGH (ref 0.44–1.00)
GFR calc Af Amer: 14 mL/min — ABNORMAL LOW (ref >60)
GFR calc non Af Amer: 12 mL/min — ABNORMAL LOW (ref >60)
Glucose, Bld: 207 mg/dL — ABNORMAL HIGH (ref 70–99)
Potassium: 4.4 mmol/L (ref 3.5–5.1)
Sodium: 140 mmol/L (ref 135–145)

## 2019-03-29 LAB — LIPID PANEL
Cholesterol: 138 mg/dL (ref 0–200)
HDL: 46 mg/dL (ref >40)
LDL Cholesterol: 78 mg/dL (ref 0–99)
Total CHOL/HDL Ratio: 3 RATIO
Triglycerides: 69 mg/dL (ref <150)
VLDL: 14 mg/dL (ref 0–40)

## 2019-03-29 LAB — GLUCOSE, CAPILLARY
Glucose-Capillary: 211 mg/dL — ABNORMAL HIGH (ref 70–99)
Glucose-Capillary: 331 mg/dL — ABNORMAL HIGH (ref 70–99)
Glucose-Capillary: 255 mg/dL — ABNORMAL HIGH (ref 70–99)
Glucose-Capillary: 226 mg/dL — ABNORMAL HIGH (ref 70–99)

## 2019-03-29 LAB — CBC
HCT: 28.3 % — ABNORMAL LOW (ref 36.0–46.0)
Hemoglobin: 8.6 g/dL — ABNORMAL LOW (ref 12.0–15.0)
MCH: 30.3 pg (ref 26.0–34.0)
MCHC: 30.4 g/dL (ref 30.0–36.0)
MCV: 99.6 fL (ref 80.0–100.0)
Platelets: 198 10*3/uL (ref 150–400)
RBC: 2.84 MIL/uL — ABNORMAL LOW (ref 3.87–5.11)
RDW: 13.8 % (ref 11.5–15.5)
WBC: 15.9 10*3/uL — ABNORMAL HIGH (ref 4.0–10.5)
nRBC: 0 % (ref 0.0–0.2)

## 2019-03-29 LAB — HEPARIN LEVEL (UNFRACTIONATED): Heparin Unfractionated: 0.45 IU/mL (ref 0.30–0.70)

## 2019-03-29 LAB — MAGNESIUM: Magnesium: 2.1 mg/dL (ref 1.7–2.4)

## 2019-03-29 MED ORDER — ACETAMINOPHEN 325 MG PO TABS
650.0000 mg | ORAL_TABLET | Freq: Four times a day (QID) | ORAL | Status: DC | PRN
  Administered 2019-04-02 – 2019-04-04 (×2): 650 mg via ORAL
  Filled 2019-03-29 (×2): qty 2

## 2019-03-29 MED ORDER — METOLAZONE 2.5 MG PO TABS
2.5000 mg | ORAL_TABLET | Freq: Once | ORAL | Status: AC
  Administered 2019-03-29: 14:00:00 2.5 mg via ORAL
  Filled 2019-03-29: qty 1

## 2019-03-29 MED ORDER — HYDROCODONE-ACETAMINOPHEN 5-325 MG PO TABS
1.0000 | ORAL_TABLET | Freq: Four times a day (QID) | ORAL | Status: DC | PRN
  Administered 2019-03-30 – 2019-04-06 (×2): 1 via ORAL
  Administered 2019-04-07: 2 via ORAL
  Administered 2019-04-07: 1 via ORAL
  Administered 2019-04-09 (×2): 2 via ORAL
  Filled 2019-03-29 (×3): qty 2
  Filled 2019-03-29 (×3): qty 1

## 2019-03-29 MED ORDER — FUROSEMIDE 10 MG/ML IJ SOLN
80.0000 mg | Freq: Once | INTRAMUSCULAR | Status: AC
  Administered 2019-03-29: 14:00:00 80 mg via INTRAVENOUS
  Filled 2019-03-29: qty 8

## 2019-03-29 NOTE — Progress Notes (Signed)
PT Cancellation Note  Patient Details Name: Ashley Velez MRN: [id number] DOB: 1935-04-13   Cancelled Treatment:    Reason Eval/Treat Not Completed: PT screened, no needs identified, will sign off. Pt is a LTC SNF resident. Plan is for pt to return to SNF at d/c. Will defer PT eval to SNF if warranted. Will sign off.   Weston Anna, PT Acute Rehabilitation Services Pager: [phone] Office: [phone]  ]

## 2019-03-29 NOTE — Progress Notes (Signed)
[place] for IV heparin Indication: chest pain/ACS  Allergies  Allergen Reactions  . Fosamax [Alendronate] Other (See Comments)    Noted to be an allergy on the patient's paperwork from facility, but no reaction is recorded  . Macrodantin [Nitrofurantoin] Other (See Comments)    Noted to be an allergy on the patient's paperwork from facility, but no reaction is recorded    Patient Measurements: Height: 5\' 3"  (160 cm) Weight: 117 lb 12.8 oz (53.4 kg) IBW/kg (Calculated) : 52.4 Heparin Dosing Weight: 52 kg  Vital Signs: Temp: 97.9 F (36.6 C) (09/02 0621) BP: 145/78 (09/02 0621) Pulse Rate: 73 (09/02 0621)  Labs: Recent Labs    03/23/2019 0835  03/12/2019 1533 03/28/19 0042 03/28/19 0538 03/28/19 1902 03/29/19 0456  HGB 9.9*  --   --  9.7*  --   --  8.6*  HCT 32.5*  --   --  29.8*  --   --  28.3*  PLT 199  --   --  212  --   --  198  HEPARINUNFRC  --   --   --   --   --  0.45 0.45  CREATININE 2.58*  --  2.59* 2.76*  --   --  3.36*  TROPONINIHS  --    < > 3,608* 3,696* 3,588*  --   --    < > = values in this interval not displayed.    Estimated Creatinine Clearance: 10.7 mL/min (A) (by C-G formula based on SCr of 3.36 mg/dL (H)).  Medications:  Medications Prior to Admission  Medication Sig Dispense Refill Last Dose  . carvedilol (COREG) 25 MG tablet Take 25 mg by mouth 2 (two) times daily.   03/26/2019 at 830pm  . clopidogrel (PLAVIX) 75 MG tablet Take 75 mg by mouth daily.   1 03/26/2019 at 5pm  . cyanocobalamin (,VITAMIN B-12,) 1000 MCG/ML injection Inject 1,000 mcg into the muscle every 30 (thirty) days. On the 28th   03/24/2019 at 7pm  . felodipine (PLENDIL) 5 MG 24 hr tablet Take 5 mg by mouth daily.   03/26/2019 at 930am  . ferrous sulfate 325 (65 FE) MG tablet Take 325 mg by mouth daily with breakfast.   03/26/2019 at 930am  . fluticasone (FLONASE) 50 MCG/ACT nasal spray Place 1 spray into both nostrils daily.   03/26/2019 at 5pm   . glipiZIDE (GLUCOTROL) 5 MG tablet Take 5 mg by mouth daily.   03/26/2019 at 530am  . hydrALAZINE (APRESOLINE) 25 MG tablet Take 25 mg by mouth 3 (three) times daily.   03/26/2019 at 830pm  . ketoconazole (NIZORAL) 2 % shampoo Apply 1 application topically as directed. Tuesday and fridays with showers   03/24/2019 at 830am  . levothyroxine (LEVOXYL) 50 MCG tablet Take 50 mcg by mouth daily before breakfast.   03/26/2019 at 530am  . lisinopril (ZESTRIL) 20 MG tablet Take 20 mg by mouth 2 (two) times daily.   03/26/2019 at 830pm  . loratadine (CLARITIN) 10 MG tablet Take 10 mg by mouth daily.   03/26/2019 at 930am  . Multiple Vitamin (MULTIVITAMIN WITH MINERALS) TABS tablet Take 1 tablet by mouth daily.   03/26/2019 at 5pm  . OLANZapine (ZYPREXA) 15 MG tablet Take 15 mg by mouth at bedtime.   03/26/2019 at 830pm  . sodium bicarbonate 650 MG tablet Take 650 mg by mouth 2 (two) times daily.   03/26/2019 at 830pm  . terazosin (HYTRIN) 5 MG capsule Take 5  mg by mouth at bedtime.   03/26/2019 at 930pm  . vitamin C (ASCORBIC ACID) 500 MG tablet Take 500 mg by mouth daily.   03/26/2019 at 930am  . Vitamin D, Ergocalciferol, (DRISDOL) 50000 UNITS CAPS capsule Take 50,000 Units by mouth as directed. Monthly on the 19th  1 03/15/2019 at 8am  . felodipine (PLENDIL) 10 MG 24 hr tablet Take 1 tablet (10 mg total) by mouth daily. (Patient not taking: Reported on 03/17/2019) 30 tablet 0 Not Taking at Unknown time  . hydrALAZINE (APRESOLINE) 50 MG tablet Take 1 tablet (50 mg total) by mouth every 8 (eight) hours. (Patient not taking: Reported on 03/07/2019) 90 tablet 0 Not Taking at Unknown time  . Maltodextrin-Xanthan Gum (RESOURCE THICKENUP CLEAR) POWD Take 120 g by mouth as needed (with all liquids). (Patient not taking: Reported on 03/26/2017) 10 Can 0 Not Taking at Unknown time   Scheduled:  . aspirin EC  81 mg Oral Daily  . atorvastatin  40 mg Oral q1800  . carvedilol  25 mg Oral BID  . clopidogrel  75 mg Oral q1800  .  fluticasone  1 spray Each Nare q1800  . insulin aspart  0-5 Units Subcutaneous QHS  . insulin aspart  0-9 Units Subcutaneous TID WC  . ipratropium-albuterol  3 mL Nebulization TID  . levothyroxine  50 mcg Oral Q0600  . loratadine  10 mg Oral Daily  . methylPREDNISolone (SOLU-MEDROL) injection  60 mg Intravenous Q12H  . OLANZapine  15 mg Oral QHS  . sodium bicarbonate  650 mg Oral BID  . terazosin  5 mg Oral QHS    Assessment: 29 yoF with PMH DM2, HTN, HLD, CKD4, hypothyroid, CVA on Plavix, admitted with hypoxia. Now with troponins elevated 100x ULN, Cardiology requesting IV heparin with Pharmacy to dose.   Baseline INR, aPTT: not done  Anticoagulation prior to admission: none  Last dose Lovenox ppx at 10p yesterday (8/31)  Plavix PTA, Cards adding ASA 81 now with UFH  Significant events:  Today, 03/29/2019:  CBC: Hgb lower today but no s/sx bleeding per RN; Plt stable WNL  Most recent heparin level therapeutic and stable this AM  SCr elevated above baseline (baseline ~1.5-2.0) and trending up  No infusion issues per nursing  Continues on ASA + Plavix  Goal of Therapy: Heparin level 0.3-0.7 units/ml Monitor platelets by anticoagulation protocol: Yes  Plan:  Continue heparin 600 units/hr IV infusion (slightly less than 12 units/kg/hr d/t poor renal function and concomitant ASA/Plavix)  Daily heparin level and CBC  Monitor for signs of bleeding or thrombosis  No plans for invasive management, anticipate heparin will continue 48 hrs or possibly until discharged   Reuel Boom, PharmD, BCPS [phone] 03/29/2019, 8:49 AM

## 2019-03-29 NOTE — Progress Notes (Signed)
Pt only had 200 mL urine out this shift. Pt has no complaints and denied feeling full in her bladder. Will continue to monitor the pt.

## 2019-03-29 NOTE — Progress Notes (Signed)
[facility]  PROGRESS NOTE  Ashley Velez [id number] DOB: 12-10-1934 DOA: 03/06/2019 PCP: Crist Infante, MD   Brief HPI:   84 year old female with history of diabetes mellitus type 2, hypertension, hyperlipidemia, stage IV CKD, schizoaffective disorder, hypothyroidism admitted on 03/13/2019 complains of shortness of breath.  Found to have non-STEMI and acute systolic CHF.  Cardiology was consulted.    Subjective   Patient seen and examined, denies shortness of breath or chest pain.   Assessment/Plan:     1. Acute systolic CHF-EF 30 to [id number] on echocardiogram with hypokinesis of the left ventricle and apical area as well as dyskinesis of the left apical segment.  Patient on IV diuresis with Lasix.  Cardiology consulted.  2. Acute kidney injury on CKD stage IV-patient baseline creatinine 1.5, creatinine is rising today is 3.76.  Palliative care was consulted and family wants to continue with medical management for now.  Patient is not a good candidate for hemodialysis.  3. Hypertension-blood pressures controlled, continue home medications.  4. Type 2 diabetes mellitus-continue sliding scale insulin with NovoLog.  5. Schizoaffective disorder-patient is on multiple psychotropic medications.  6. Hypothyroidism-continue Synthroid.       CBG: Recent Labs  Lab 03/28/19 1638 03/28/19 2100 03/29/19 0735 03/29/19 1114 03/29/19 1608  GLUCAP 230* 210* 211* 331* 255*    CBC: Recent Labs  Lab 02/26/2019 0835 03/28/19 0042 03/29/19 0456  WBC 14.3* 14.5* 15.9*  NEUTROABS 12.4*  --   --   HGB 9.9* 9.7* 8.6*  HCT 32.5* 29.8* 28.3*  MCV 101.9* 97.4 99.6  PLT 199 212 [id number]    Basic Metabolic Panel: Recent Labs  Lab 03/21/2019 0835 03/06/2019 1533 03/28/19 0042 03/29/19 0456  NA 136 137 138 140  K 5.5* 5.5* 4.4 4.4  CL 108 109 107 108  CO2 16* 14* 18* 20*  GLUCOSE 212* 176* 184* 207*  BUN 40* 40* 45* 65*  CREATININE 2.58* 2.59* 2.76* 3.36*  CALCIUM 8.9 8.6* 8.6*  8.2*  MG  --   --   --  2.1     DVT prophylaxis: Heparin  Code Status: Full code  Family Communication: Discussed with patient's daughter at bedside  Disposition Plan: To be decided based on patient's clinical course in the hospital        BMI  Estimated body mass index is 20.87 kg/m as calculated from the following:   Height as of this encounter: 5\' 3"  (1.6 m).   Weight as of this encounter: 53.4 kg.  Scheduled medications:  . aspirin EC  81 mg Oral Daily  . atorvastatin  40 mg Oral q1800  . carvedilol  25 mg Oral BID  . clopidogrel  75 mg Oral q1800  . fluticasone  1 spray Each Nare q1800  . insulin aspart  0-5 Units Subcutaneous QHS  . insulin aspart  0-9 Units Subcutaneous TID WC  . ipratropium-albuterol  3 mL Nebulization TID  . levothyroxine  50 mcg Oral Q0600  . loratadine  10 mg Oral Daily  . methylPREDNISolone (SOLU-MEDROL) injection  60 mg Intravenous Q12H  . OLANZapine  15 mg Oral QHS  . sodium bicarbonate  650 mg Oral BID  . terazosin  5 mg Oral QHS    Consultants:  Cardiology  Procedures:     Antibiotics:   Anti-infectives (From admission, onward)   Start     Dose/Rate Route Frequency Ordered Stop   03/19/2019 0900  cefTRIAXone (ROCEPHIN) 2 g in sodium chloride 0.9 % 100 mL IVPB  2 g 200 mL/hr over 30 Minutes Intravenous Every 24 hours 03/06/2019 0859     03/26/2019 0900  azithromycin (ZITHROMAX) 500 mg in sodium chloride 0.9 % 250 mL IVPB     500 mg 250 mL/hr over 60 Minutes Intravenous Every 24 hours 03/16/2019 0859         Objective   Vitals:   03/29/19 0500 03/29/19 0621 03/29/19 0753 03/29/19 1403  BP:  (!) 145/78  133/68  Pulse:  73  81  Resp:  20  16  Temp:  97.9 F (36.6 C)  98.1 F (36.7 C)  TempSrc:    Oral  SpO2:  100% 99% 99%  Weight: 53.4 kg     Height:        Intake/Output Summary (Last 24 hours) at 03/29/2019 1806 Last data filed at 03/29/2019 1400 Gross per 24 hour  Intake 1023.39 ml  Output 300 ml  Net 723.39  ml   Filed Weights   03/25/2019 1341 03/28/19 0447 03/29/19 0500  Weight: 53.2 kg 52.4 kg 53.4 kg     Physical Examination:    General: Appears in no acute distress  Cardiovascular: S1-S2, regular, no murmur auscultated  Respiratory: Clear to auscultation bilaterally  Abdomen: Soft, nontender, no organomegaly  Extremities: No edema in the lower extremities  Neurologic: Alert, oriented x3, no focal deficit noted     Data Reviewed: I have personally reviewed following labs and imaging studies   Recent Results (from the past 240 hour(s))  Blood Culture (routine x 2)     Status: None (Preliminary result)   Collection Time: 03/16/2019  9:00 AM   Specimen: BLOOD LEFT HAND  Result Value Ref Range Status   Specimen Description   Final    BLOOD LEFT HAND Performed at [facility], [address] [place], [place] [address]    Special Requests   Final    BOTTLES DRAWN AEROBIC AND ANAEROBIC Blood Culture adequate volume Performed at [facility] [address], [place], [place] [address]    Culture   Final    NO GROWTH 2 DAYS Performed at [facility] [address], [place], [place] [address]    Report Status PENDING  Incomplete  SARS Coronavirus 2 [facility] order, Performed in [facility] hospital lab) Nasopharyngeal Nasopharyngeal Swab     Status: None   Collection Time: 03/17/2019  9:00 AM   Specimen: Nasopharyngeal Swab  Result Value Ref Range Status   SARS Coronavirus 2 NEGATIVE NEGATIVE Final    Comment: (NOTE) If result is NEGATIVE SARS-CoV-2 target nucleic acids are NOT DETECTED. The SARS-CoV-2 RNA is generally detectable in upper and lower  respiratory specimens during the acute phase of infection. The lowest  concentration of SARS-CoV-2 viral copies this assay can detect is 250  copies / mL. A negative result does not preclude SARS-CoV-2 infection  and should not be used as the sole basis for treatment or other   patient management decisions.  A negative result may occur with  improper specimen collection / handling, submission of specimen other  than nasopharyngeal swab, presence of viral mutation(s) within the  areas targeted by this assay, and inadequate number of viral copies  (<250 copies / mL). A negative result must be combined with clinical  observations, patient history, and epidemiological information. If result is POSITIVE SARS-CoV-2 target nucleic acids are DETECTED. The SARS-CoV-2 RNA is generally detectable in upper and lower  respiratory specimens dur ing the acute phase of  infection.  Positive  results are indicative of active infection with SARS-CoV-2.  Clinical  correlation with patient history and other diagnostic information is  necessary to determine patient infection status.  Positive results do  not rule out bacterial infection or co-infection with other viruses. If result is PRESUMPTIVE POSTIVE SARS-CoV-2 nucleic acids MAY BE PRESENT.   A presumptive positive result was obtained on the submitted specimen  and confirmed on repeat testing.  While 2019 novel coronavirus  (SARS-CoV-2) nucleic acids may be present in the submitted sample  additional confirmatory testing may be necessary for epidemiological  and / or clinical management purposes  to differentiate between  SARS-CoV-2 and other Sarbecovirus currently known to infect humans.  If clinically indicated additional testing with an alternate test  methodology [phone]) is advised. The SARS-CoV-2 RNA is generally  detectable in upper and lower respiratory sp ecimens during the acute  phase of infection. The expected result is Negative. Fact Sheet for Patients:  StrictlyIdeas.no Fact Sheet for Healthcare Providers: BankingDealers.co.za This test is not yet approved or cleared by the [place] FDA and has been authorized for detection and/or diagnosis of SARS-CoV-2  by FDA under an Emergency Use Authorization (EUA).  This EUA will remain in effect (meaning this test can be used) for the duration of the COVID-19 declaration under Section 564(b)(1) of the Act, 21 U.S.C. section 360bbb-3(b)(1), unless the authorization is terminated or revoked sooner. Performed at [facility], [place] [address], [place], [place] [address]   Blood Culture (routine x 2)     Status: None (Preliminary result)   Collection Time: 03/18/2019  2:13 PM   Specimen: BLOOD  Result Value Ref Range Status   Specimen Description   Final    BLOOD LEFT ARM Performed at [facility] [address] [place], [place] [address]    Special Requests   Final    BOTTLES DRAWN AEROBIC ONLY Blood Culture adequate volume Performed at [facility] [address] [place], [place] [address]    Culture   Final    NO GROWTH 2 DAYS Performed at [facility] [address] [place],  [address]    Report Status PENDING  Incomplete     Liver Function Tests: No results for input(s): AST, ALT, ALKPHOS, BILITOT, PROT, ALBUMIN in the last 168 hours. No results for input(s): LIPASE, AMYLASE in the last 168 hours. No results for input(s): AMMONIA in the last 168 hours.  Cardiac Enzymes: No results for input(s): CKTOTAL, CKMB, CKMBINDEX, TROPONINI in the last 168 hours. BNP (last 3 results) Recent Labs    03/25/2019 0835  BNP 1,026.3*     Admission status: Inpatient: Based on patients clinical presentation and evaluation of above clinical data, I have made determination that patient meets Inpatient criteria at this time.  Time spent: 20 minutes  [facility] Pager [phone]. If 7PM-7AM, please contact night-coverage at [email or web address], Office  [phone]  password [email or web address]  03/29/2019, 6:06 PM  LOS: 2 days

## 2019-03-29 NOTE — TOC Progression Note (Signed)
Transition of Care [facility]) - Progression Note    Patient Details  Name: Ashley Velez MRN: [id number] Date of Birth: March 30, 1935  Transition of Care [facility] CM/SW [place], LCSW Phone Number: 03/29/2019, 11:04 AM  Clinical Narrative:   Patient is a long term care resident at [facility] and will return once medically stable. Facility is requesting a negative covid test that within 3 days. CSW will continue to follow     Expected Discharge Plan: [place] Barriers to Discharge: Continued Medical Work up  Expected Discharge Plan and Services Expected Discharge Plan: [place]   Discharge Planning Services: CM Consult   Living arrangements for the past 2 months: Post-Acute Facility Expected Discharge Date: (unknown)                                     Social Determinants of Health (SDOH) Interventions    Readmission Risk Interventions No flowsheet data found.

## 2019-03-29 NOTE — Progress Notes (Addendum)
Progress Note  Patient Name: Ashley Velez Date of Encounter: 03/29/2019  Primary Cardiologist: New   Subjective   Breathing improved today. No chest pain or SOB. Remains mildly confused   Inpatient Medications    Scheduled Meds:  aspirin EC  81 mg Oral Daily   atorvastatin  40 mg Oral q1800   carvedilol  25 mg Oral BID   clopidogrel  75 mg Oral q1800   fluticasone  1 spray Each Nare q1800   insulin aspart  0-5 Units Subcutaneous QHS   insulin aspart  0-9 Units Subcutaneous TID WC   ipratropium-albuterol  3 mL Nebulization TID   levothyroxine  50 mcg Oral Q0600   loratadine  10 mg Oral Daily   methylPREDNISolone (SOLU-MEDROL) injection  60 mg Intravenous Q12H   OLANZapine  15 mg Oral QHS   sodium bicarbonate  650 mg Oral BID   terazosin  5 mg Oral QHS   Continuous Infusions:  azithromycin Stopped (03/28/19 1114)   cefTRIAXone (ROCEPHIN)  IV 2 g (03/29/19 0851)   heparin 600 Units/hr (03/28/19 1400)   PRN Meds:    Vital Signs    Vitals:   03/28/19 2058 03/29/19 0500 03/29/19 0621 03/29/19 0753  BP: 128/70  (!) 145/78   Pulse: 75  73   Resp: 18  20   Temp: 98.6 F (37 C)  97.9 F (36.6 C)   TempSrc:      SpO2: 100%  100% 99%  Weight:  53.4 kg    Height:        Intake/Output Summary (Last 24 hours) at 03/29/2019 0938 Last data filed at 03/29/2019 0600 Gross per 24 hour  Intake 1017.88 ml  Output 500 ml  Net 517.88 ml   Filed Weights   03/07/2019 1341 03/28/19 0447 03/29/19 0500  Weight: 53.2 kg 52.4 kg 53.4 kg    Physical Exam   General: Elderly, NAD Neck: Negative for carotid bruits. No JVD Lungs:Clear to ausculation bilaterally. No wheezes, rales, or rhonchi. Breathing is unlabored. Cardiovascular: RRR with S1 S2. No murmur Abdomen: Soft, non-tender, non-distended. No obvious abdominal masses. Extremities: No edema. No clubbing or cyanosis. DP pulses 1+ bilaterally Neuro: Alert and oriented to self and time only. No focal  deficits. No facial asymmetry. MAE spontaneously. Psych: Responds to questions somewhat appropriately with normal affect.    Labs    Chemistry Recent Labs  Lab 03/16/2019 1533 03/28/19 0042 03/29/19 0456  NA 137 138 140  K 5.5* 4.4 4.4  CL 109 107 108  CO2 14* 18* 20*  GLUCOSE 176* 184* 207*  BUN 40* 45* 65*  CREATININE 2.59* 2.76* 3.36*  CALCIUM 8.6* 8.6* 8.2*  GFRNONAA 17* 15* 12*  GFRAA 19* 18* 14*  ANIONGAP 14 13 12      Hematology Recent Labs  Lab 03/25/2019 0835 02/28/2019 1413 03/28/19 0042 03/29/19 0456  WBC 14.3*  --  14.5* 15.9*  RBC 3.19* 3.19* 3.06* 2.84*  HGB 9.9*  --  9.7* 8.6*  HCT 32.5*  --  29.8* 28.3*  MCV 101.9*  --  97.4 99.6  MCH 31.0  --  31.7 30.3  MCHC 30.5  --  32.6 30.4  RDW 13.7  --  13.7 13.8  PLT 199  --  212 198    Cardiac EnzymesNo results for input(s): TROPONINI in the last 168 hours. No results for input(s): TROPIPOC in the last 168 hours.   BNP Recent Labs  Lab 03/12/2019 0835  BNP 1,026.3*  DDimer No results for input(s): DDIMER in the last 168 hours.   Radiology    Ct Chest Wo Contrast  Result Date: 03/09/2019 CLINICAL DATA:  84 year old female with acute shortness of breath. EXAM: CT CHEST WITHOUT CONTRAST TECHNIQUE: Multidetector CT imaging of the chest was performed following the standard protocol without IV contrast. COMPARISON:  03/02/2019 radiograph, 12/30/2016 chest CT and prior studies FINDINGS: Cardiovascular: Mild cardiomegaly noted. Heavy coronary artery atherosclerotic calcifications are present. Aortic atherosclerotic calcifications are noted without thoracic aortic aneurysm. A small pericardial effusion is present. Mediastinum/Nodes: No mediastinal mass or enlarged lymph nodes. The visualized esophagus is unremarkable. Thyroid calcifications are unchanged. Lungs/Pleura: Moderate bilateral pleural effusions are noted. Central ground-glass/airspace opacities bilaterally are present. Bilateral LOWER lung atelectasis is  noted. No definite mass identified. No evidence of pneumothorax. Upper Abdomen: No acute abnormality identified. Musculoskeletal: No acute or suspicious bony abnormality. IMPRESSION: 1. Moderate bilateral central ground-glass/airspace opacities, favor edema over infection. 2. Moderate bilateral pleural effusions with bibasilar atelectasis. 3. Cardiomegaly and coronary disease. 4.  Aortic Atherosclerosis (ICD10-I70.0). Electronically Signed   By: Margarette Canada M.D.   On: 03/08/2019 15:02   US Renal  Result Date: 03/02/2019 CLINICAL DATA:  84 year old female with acute kidney injury. EXAM: RENAL / URINARY TRACT ULTRASOUND COMPLETE COMPARISON:  01/09/2017 ultrasound FINDINGS: Right Kidney: Renal measurements: 8.7 x 3.8 x 3.7 cm = volume: 65 mL. Increased echogenicity noted. Scattered cysts are present. No hydronephrosis. Left Kidney: Renal measurements: 7.4 x 3.5 x 3.7 cm = volume: 47 mL. Increased echogenicity noted. Scattered cysts are present. No hydronephrosis. Bladder: Appears normal for degree of bladder distention. IMPRESSION: 1. Small hyperechoic kidneys compatible with chronic medical renal disease. No evidence of hydronephrosis. 2. Bilateral renal cysts. Electronically Signed   By: Margarette Canada M.D.   On: 03/06/2019 16:47   Telemetry    03/29/2019 NSR 90s  - Personally Reviewed  ECG    No new tracing as of 03/29/2019- Personally Reviewed  Cardiac Studies   Echo 03/28/2019:   1. Mild dyskinesis of the left ventricular, entire apical segment.  2. Severe hypokinesis of the left ventricular, mid-apical anteroseptal wall and anterior wall.  3. The left ventricle has moderate-severely reduced systolic function, with an ejection fraction of 30-35%. The cavity size was normal. Left ventricular diastolic Doppler parameters are consistent with pseudonormalization. Elevated mean left atrial  pressure.  4. The right ventricle has normal systolic function. The cavity was normal. There is no increase in  right ventricular wall thickness. Right ventricular systolic pressure is mildly elevated.  5. Left atrial size was mildly dilated.  6. Trivial pericardial effusion is present.  7. The mitral valve is grossly normal. Mild thickening of the mitral valve leaflet. Mitral valve regurgitation is moderate by color flow Doppler. The MR jet is centrally-directed.  8. The aortic valve is tricuspid. Mild thickening of the aortic valve. Aortic valve regurgitation is trivial by color flow Doppler.  9. The aorta is normal unless otherwise noted. 10. The inferior vena cava was dilated in size with <50% respiratory variability. 11. No intracardiac thrombi or masses were visualized with Definity contrast imaging.  Echo 05/2013   Study Conclusions   - Left ventricle: The cavity size was normal. Wall thickness  was increased in a pattern of mild LVH. There was mild  focal basal hypertrophy of the septum. Systolic function  was normal. The estimated ejection fraction was in the  range of 55% to 65%. Wall motion was normal; there were no  regional  wall motion abnormalities. Doppler parameters are  consistent with abnormal left ventricular relaxation  (grade 1 diastolic dysfunction).  - Aortic valve: Trivial regurgitation.  - Mitral valve: Calcified annulus. Mild regurgitation.  - Pericardium, extracardiac: A trivial pericardial effusion  was identified.   Patient Profile     84 y.o. female with a hx of DM, HTN, HLD, CKD-4, schizoaffective disorder, hypothyroidism, and stroke who is being seen today for the evaluation of elevated troponin after admit for hypoxia,  CHF with BNP of 1026 at the request of Dr. Posey Pronto.  Assessment & Plan    1. NSTEMI: -Late presenting NSTEMI with HsT >3000 -Echocardiogram from 03/28/2019 with mild dyskinesis of the left ventricular and entire apical segment, severe hypokinesis of the left ventricular, mid-apical anteroseptal wall and anterior wall with an LV EF  of 30-35% -Pt comfortable, continues to be mildly confused>>not a candidate for further invasive testing in the setting of disorientation, frailty and sever AKI>>medical management  -Likely palliative care per primary team notes  -Risk stratify with statin, BB and ASA>>statin initiated  -On Plavix for hx of CVA -On IV Hep gtt -HsT down-trending>>3696>3588  2. Presumed ischemic cardiomyopathy: -Echo results as above with LVEF of 25-30% -IV diuretics d/ced in the setting of acute on chronic kidney disease with minimal UO  -Breathing better>>does not appear to be fluid volume overloaded on exam  -May need nephrology consultation given AKI -Weight, 117lb today  -I&O, net positive 464ml  -No ACE/ARB in the setting of AKI -Continue carvedilol 25mg  BID  3. Acute on chronic kidney disease Stage IV: -Creatinine, 3.36, up from 2.76 yesterday  -Baseline appears to be in the 1.5 range -Primary team dicussed with family who want to pursue palliative care and DNR status   Signed, Kathyrn Drown NP-C [place] Pager: [phone] 03/29/2019, 9:38 AM    For questions or updates, please contact   Please consult [email or web address] for contact info under Cardiology/STEMI.  Patient seen and examined.  Agree with above documentation.  On exam, patient is oriented to person only but answers questions appropriately, diminished breath sounds, regular rate and rhythm no murmur.  She denies any chest pain.  Telemetry personally reviewed shows sinus rhythm with rate in the 80s, occasional PVCs.  Renal function continues to worsen up to 3.36 today from 2.76 yesterday.  Palliative medicine was consulted.  I had a conversation with patient and her daughter.  They both reaffirmed that she would not want any invasive treatments at this point.  Specifically, she would not want a heart catheterization or hemodialysis.  Her daughter does want to continue medical therapies.  Discussed that with multiorgan failure her prognosis  is very poor.  She does not appear particularly volume overloaded on exam, but given her worsening renal function despite being net positive the last few days and considering dilated non-collapsible IVC on echo and BNP over 1000, suspect she is volume overloaded will attempt more aggressive diuresis today with Lasix plus metolazone.  For her NSTEMI plan to continue heparin drip for 48 hours.  In regards to her heart failure, he is on carvedilol.  Not a candidate for ACE or ARB but in setting of AKI.

## 2019-03-29 NOTE — Progress Notes (Signed)
Inpatient Diabetes Program Recommendations  AACE/ADA: New Consensus Statement on Inpatient Glycemic Control (2015)  Target Ranges:  Prepandial:   less than 140 mg/dL      Peak postprandial:   less than 180 mg/dL (1-2 hours)      Critically ill patients:  140 - 180 mg/dL   Results for KEARAH, IFTIKHAR (MRN [id number]) as of 03/29/2019 11:51  Ref. Range 03/28/2019 07:59 03/28/2019 11:12 03/28/2019 16:38 03/28/2019 21:00  Glucose-Capillary Latest Ref Range: 70 - 99 mg/dL 199 (H) 249 (H) 230 (H) 210 (H)   Results for AALINA, SNARSKI (MRN [id number]) as of 03/29/2019 11:51  Ref. Range 03/29/2019 07:35 03/29/2019 11:14  Glucose-Capillary Latest Ref Range: 70 - 99 mg/dL 211 (H) 331 (H)      Admit NSTEMI/ SOB/ CHF  History: DM, CKD 4, Schizoaffective d/o, CVA   Home DM Meds: Glipizide 5 mg Daily  Current Orders: Novolog Sensitive Correction Scale/ SSI (0-9 units) TID AC + HS       Getting Solumedrol 60 mg BID.  CBGs elevated.    MD- If patient to remain on IV Steroids, please consider the following if within goals of care:  1. Start Lantus 5 units QHS (0.1 units/kg)  2. Increase Novolog SSI to Moderate scale (0-15 units) TID AC + HS     --Will follow patient during hospitalization--  Wyn Quaker RN, MSN, CDE Diabetes Coordinator Inpatient Glycemic Control Team Team Pager: [phone] (8a-5p)

## 2019-03-29 NOTE — Consult Note (Signed)
Consultation Note Date: 03/29/2019   Patient Name: Ashley Velez  DOB: 04/20/1935  MRN: [id number]  Age / Sex: 84 y.o., female  PCP: Crist Infante, MD Referring Physician: Oswald Hillock, MD  Reason for Consultation: Establishing goals of care and Psychosocial/spiritual support  HPI/Patient Profile: 84 y.o. female  admitted on 03/25/2019 with past  medical history significant of DM, hypertension, hyperlipidemia, stage 4 CKD, schizoaffective disorder, hypothyroidism, stroke, was sent from [facility] ( has lived at [facility] 2 yesrs) for SOB, wheezing and hypoxia.    ED Course: on arrival to ED, pt is alert and slightly tachycardia, tachypneic, hypoxic on RA, and required 2lit of [place] oxygen to keep sats greater than 90% labs were significant fr creatinine at 2.58, BUN of 40, bicarb of 16, potassium of 5.5, BNP of 1026, lactic acid of 1.1, wbc count of 14.3, hemoglobin of 9.9, covid 19 negative and CXR showed New bibasilar pulmonary infiltrates question pneumonia versus aspiration.  Currently patient remains weak with worsening renal function.  Family face treatment option decisions, advanced directive decisions and anticipatory care needs.    Clinical Assessment and Goals of Care:   This NP Wadie Lessen reviewed medical records, received report from team, assessed the patient and then meet at the patient's bedside along with her daughter/Lisa Burt Knack and Nonah Mattes  to discuss diagnosis, prognosis, GOC, EOL wishes disposition and options.  We discussed concept specific to adult failure to thrive and the limitations of medical interventions to prolong life when a body begins to decline.   Concept of Hospice and Palliative Care were discussed  A  discussion was had today regarding advanced directives.  Concepts specific to code status, artifical feeding and hydration, continued IV antibiotics and  rehospitalization was had.  The difference between a aggressive medical intervention path  and a palliative comfort care path for this patient at this time was had.  Values and goals of care important to patient and family were attempted to be elicited.  MOST form introduced, Hard Choices left for review  Natural trajectory and expectations at EOL were discussed.  Questions and concerns addressed.   Family encouraged to call with questions or concerns.    PMT will continue to support holistically.     Daughter is only child and main caregiver for her mother for most of her life secondary to psychiatric conditions   SUMMARY OF RECOMMENDATIONS    Code Status/Advance Care Planning:  Full code-encouraged family strongly to consider DNR/DNI status knowing poor outcomes in similar patients.   Palliative Prophylaxis:   Aspiration, Delirium Protocol and Oral Care  Additional Recommendations (Limitations, Scope, Preferences):  Full Scope Treatment--Family is open to all offered and available medical interventions to prolong life at this time.  They remain hopeful for improvement.  They tell me that they have seen her "down and in worse shape than this many times and  has bounced back"  Discussed with family  the importance of continued conversation with each other  and the medical providers regarding overall  plan of care and treatment options,  ensuring decisions are within the context of the patients values and GOCs.   Psycho-social/Spiritual:   Desire for further Chaplaincy support:no  Additional Recommendations: Education on Hospice  Prognosis:   < 6 months  Discharge Planning: To Be Determined      Primary Diagnoses: Present on Admission: . AKI (acute kidney injury) ([place])   I have reviewed the medical record, interviewed the patient and family, and examined the patient. The following aspects are pertinent.  Past Medical History:  Diagnosis Date  . Acute respiratory  failure ([facility]) 09/2016  . Diabetes mellitus without complication ([place])   . Epilepsy ([place])   . Hyperlipidemia   . Hypertension   . Renal disorder    Stage 4 CKD  . Schizoaffective disorder ([place])   . Stroke ([place])   . Thyroid disease    hypothyroid  . Vitamin D deficiency    Social History   Socioeconomic History  . Marital status: Married    Spouse name: Not on file  . Number of children: Not on file  . Years of education: Not on file  . Highest education level: Not on file  Occupational History  . Not on file  Social Needs  . Financial resource strain: Not on file  . Food insecurity    Worry: Not on file    Inability: Not on file  . Transportation needs    Medical: Not on file    Non-medical: Not on file  Tobacco Use  . Smoking status: Never Smoker  . Smokeless tobacco: Never Used  Substance and Sexual Activity  . Alcohol use: No  . Drug use: No  . Sexual activity: Not on file  Lifestyle  . Physical activity    Days per week: Not on file    Minutes per session: Not on file  . Stress: Not on file  Relationships  . Social Herbalist on phone: Not on file    Gets together: Not on file    Attends religious service: Not on file    Active member of club or organization: Not on file    Attends meetings of clubs or organizations: Not on file    Relationship status: Not on file  Other Topics Concern  . Not on file  Social History Narrative  . Not on file   No family history on file. Scheduled Meds: . aspirin EC  81 mg Oral Daily  . atorvastatin  40 mg Oral q1800  . carvedilol  25 mg Oral BID  . clopidogrel  75 mg Oral q1800  . fluticasone  1 spray Each Nare q1800  . insulin aspart  0-5 Units Subcutaneous QHS  . insulin aspart  0-9 Units Subcutaneous TID WC  . ipratropium-albuterol  3 mL Nebulization TID  . levothyroxine  50 mcg Oral Q0600  . loratadine  10 mg Oral Daily  . methylPREDNISolone (SOLU-MEDROL) injection  60 mg Intravenous Q12H  .  OLANZapine  15 mg Oral QHS  . sodium bicarbonate  650 mg Oral BID  . terazosin  5 mg Oral QHS   Continuous Infusions: . azithromycin Stopped (03/28/19 1114)  . cefTRIAXone (ROCEPHIN)  IV 2 g (03/29/19 0851)  . heparin 600 Units/hr (03/28/19 1400)   PRN Meds:. Medications Prior to Admission:  Prior to Admission medications   Medication Sig Start Date End Date Taking? Authorizing Provider  carvedilol (COREG) 25 MG tablet Take 25 mg by mouth 2 (two) times daily.  03/05/19  Yes [provider]  clopidogrel (PLAVIX) 75 MG tablet Take 75 mg by mouth daily.  08/22/15  Yes [provider]  cyanocobalamin (,VITAMIN B-12,) 1000 MCG/ML injection Inject 1,000 mcg into the muscle every 30 (thirty) days. On the 28th   Yes [provider]  felodipine (PLENDIL) 5 MG 24 hr tablet Take 5 mg by mouth daily. 03/26/19  Yes [provider]  ferrous sulfate 325 (65 FE) MG tablet Take 325 mg by mouth daily with breakfast.   Yes [provider]  fluticasone (FLONASE) 50 MCG/ACT nasal spray Place 1 spray into both nostrils daily.   Yes [provider]  glipiZIDE (GLUCOTROL) 5 MG tablet Take 5 mg by mouth daily. 03/10/19  Yes [provider]  hydrALAZINE (APRESOLINE) 25 MG tablet Take 25 mg by mouth 3 (three) times daily.   Yes [provider]  ketoconazole (NIZORAL) 2 % shampoo Apply 1 application topically as directed. Tuesday and fridays with showers 03/09/19  Yes [provider]  levothyroxine (LEVOXYL) 50 MCG tablet Take 50 mcg by mouth daily before breakfast.   Yes [provider]  lisinopril (ZESTRIL) 20 MG tablet Take 20 mg by mouth 2 (two) times daily. 03/16/19  Yes [provider]  loratadine (CLARITIN) 10 MG tablet Take 10 mg by mouth daily.   Yes [provider]  Multiple Vitamin (MULTIVITAMIN WITH MINERALS) TABS tablet Take 1 tablet by mouth daily.   Yes [provider]  OLANZapine (ZYPREXA) 15 MG  tablet Take 15 mg by mouth at bedtime. 03/26/19  Yes [provider]  sodium bicarbonate 650 MG tablet Take 650 mg by mouth 2 (two) times daily.   Yes [provider]  terazosin (HYTRIN) 5 MG capsule Take 5 mg by mouth at bedtime. 02/28/19  Yes [provider]  vitamin C (ASCORBIC ACID) 500 MG tablet Take 500 mg by mouth daily.   Yes [provider]  Vitamin D, Ergocalciferol, (DRISDOL) 50000 UNITS CAPS capsule Take 50,000 Units by mouth as directed. Monthly on the 19th 12/31/14  Yes [provider]  felodipine (PLENDIL) 10 MG 24 hr tablet Take 1 tablet (10 mg total) by mouth daily. Patient not taking: Reported on 03/26/2019 01/12/17   Dhungel, Flonnie Overman, MD  hydrALAZINE (APRESOLINE) 50 MG tablet Take 1 tablet (50 mg total) by mouth every 8 (eight) hours. Patient not taking: Reported on 03/17/2019 03/27/17   Thurnell Lose, MD  Maltodextrin-Xanthan Gum (RESOURCE THICKENUP CLEAR) POWD Take 120 g by mouth as needed (with all liquids). Patient not taking: Reported on 03/26/2017 01/12/17   Louellen Molder, MD   Allergies  Allergen Reactions  . Fosamax [Alendronate] Other (See Comments)    Noted to be an allergy on the patient's paperwork from facility, but no reaction is recorded  . Macrodantin [Nitrofurantoin] Other (See Comments)    Noted to be an allergy on the patient's paperwork from facility, but no reaction is recorded   Review of Systems  Unable to perform ROS: Acuity of condition    Physical Exam Constitutional:      Appearance: She is underweight.  Cardiovascular:     Rate and Rhythm: Normal rate.  Pulmonary:     Effort: Pulmonary effort is normal.  Skin:    General: Skin is warm and dry.  Neurological:     Mental Status: She is alert. She is confused.     Vital Signs: BP (!) 145/78 (BP Location: Right Arm)   Pulse 73  Temp 97.9 F (36.6 C)   Resp 20   Ht 5\' 3"  (1.6 m)   Wt 53.4 kg   SpO2 99%   BMI 20.87 kg/m  Pain Scale: 0-10    Pain Score: 0-No pain   SpO2: SpO2: 99 % O2 Device:SpO2: 99 % O2 Flow Rate: .O2 Flow Rate (L/min): 2 L/min  IO: Intake/output summary:   Intake/Output Summary (Last 24 hours) at 03/29/2019 0951 Last data filed at 03/29/2019 0600 Gross per 24 hour  Intake 1017.88 ml  Output 500 ml  Net 517.88 ml    LBM: Last BM Date: 03/29/19 Baseline Weight: Weight: 49 kg Most recent weight: Weight: 53.4 kg     Palliative Assessment/Data:   Discussed with Dr Darrick Meigs    PMT will continue to support holistically  Time In: 1045 Time Out: 1200 Time Total: 70 minutes Greater than 50%  of this time was spent counseling and coordinating care related to the above assessment and plan.  Signed by: Wadie Lessen, NP   Please contact Palliative Medicine Team phone at [phone] for questions and concerns.  For individual provider: See Shea Evans

## 2019-03-30 ENCOUNTER — Encounter (HOSPITAL_COMMUNITY): Payer: Self-pay | Admitting: Nephrology

## 2019-03-30 DIAGNOSIS — I5021 Acute systolic (congestive) heart failure: Secondary | ICD-10-CM

## 2019-03-30 DIAGNOSIS — N184 Chronic kidney disease, stage 4 (severe): Secondary | ICD-10-CM

## 2019-03-30 DIAGNOSIS — I214 Non-ST elevation (NSTEMI) myocardial infarction: Secondary | ICD-10-CM

## 2019-03-30 DIAGNOSIS — N179 Acute kidney failure, unspecified: Secondary | ICD-10-CM

## 2019-03-30 LAB — BASIC METABOLIC PANEL
Anion gap: 15 (ref 5–15)
BUN: 81 mg/dL — ABNORMAL HIGH (ref 8–23)
CO2: 17 mmol/L — ABNORMAL LOW (ref 22–32)
Calcium: 7.9 mg/dL — ABNORMAL LOW (ref 8.9–10.3)
Chloride: 105 mmol/L (ref 98–111)
Creatinine, Ser: 3.85 mg/dL — ABNORMAL HIGH (ref 0.44–1.00)
GFR calc Af Amer: 12 mL/min — ABNORMAL LOW (ref >60)
GFR calc non Af Amer: 10 mL/min — ABNORMAL LOW (ref >60)
Glucose, Bld: 202 mg/dL — ABNORMAL HIGH (ref 70–99)
Potassium: 4.4 mmol/L (ref 3.5–5.1)
Sodium: 137 mmol/L (ref 135–145)

## 2019-03-30 LAB — CBC
HCT: 29.1 % — ABNORMAL LOW (ref 36.0–46.0)
Hemoglobin: 9.1 g/dL — ABNORMAL LOW (ref 12.0–15.0)
MCH: 31.6 pg (ref 26.0–34.0)
MCHC: 31.3 g/dL (ref 30.0–36.0)
MCV: 101 fL — ABNORMAL HIGH (ref 80.0–100.0)
Platelets: 213 10*3/uL (ref 150–400)
RBC: 2.88 MIL/uL — ABNORMAL LOW (ref 3.87–5.11)
RDW: 13.5 % (ref 11.5–15.5)
WBC: 15.2 10*3/uL — ABNORMAL HIGH (ref 4.0–10.5)
nRBC: 0 % (ref 0.0–0.2)

## 2019-03-30 LAB — GLUCOSE, CAPILLARY
Glucose-Capillary: 210 mg/dL — ABNORMAL HIGH (ref 70–99)
Glucose-Capillary: 314 mg/dL — ABNORMAL HIGH (ref 70–99)
Glucose-Capillary: 221 mg/dL — ABNORMAL HIGH (ref 70–99)
Glucose-Capillary: 181 mg/dL — ABNORMAL HIGH (ref 70–99)

## 2019-03-30 LAB — HEPARIN LEVEL (UNFRACTIONATED): Heparin Unfractionated: 0.46 IU/mL (ref 0.30–0.70)

## 2019-03-30 MED ORDER — HEPARIN SODIUM (PORCINE) 5000 UNIT/ML IJ SOLN
5000.0000 [IU] | Freq: Three times a day (TID) | INTRAMUSCULAR | Status: DC
  Administered 2019-03-30 – 2019-04-07 (×24): 5000 [IU] via SUBCUTANEOUS
  Filled 2019-03-30 (×23): qty 1

## 2019-03-30 MED ORDER — HEPARIN SODIUM (PORCINE) 5000 UNIT/ML IJ SOLN
5000.0000 [IU] | Freq: Three times a day (TID) | INTRAMUSCULAR | Status: DC
  Filled 2019-03-30: qty 1

## 2019-03-30 MED ORDER — SODIUM CHLORIDE 0.9 % IV SOLN
INTRAVENOUS | Status: DC
  Administered 2019-03-30 – 2019-03-31 (×3): via INTRAVENOUS

## 2019-03-30 NOTE — Progress Notes (Signed)
[place] for IV heparin Indication: chest pain/ACS  Allergies  Allergen Reactions  . Fosamax [Alendronate] Other (See Comments)    Noted to be an allergy on the patient's paperwork from facility, but no reaction is recorded  . Macrodantin [Nitrofurantoin] Other (See Comments)    Noted to be an allergy on the patient's paperwork from facility, but no reaction is recorded    Patient Measurements: Height: 5\' 3"  (160 cm) Weight: 127 lb 14.4 oz (58 kg) IBW/kg (Calculated) : 52.4 Heparin Dosing Weight: 52 kg  Vital Signs: Temp: 98 F (36.7 C) (09/03 0500) Temp Source: Oral (09/03 0500) BP: 144/80 (09/03 0500) Pulse Rate: 79 (09/03 0500)  Labs: Recent Labs    03/14/2019 1533 03/28/19 0042 03/28/19 0538 03/28/19 1902 03/29/19 0456 03/30/19 0512  HGB  --  9.7*  --   --  8.6* 9.1*  HCT  --  29.8*  --   --  28.3* 29.1*  PLT  --  212  --   --  198 213  HEPARINUNFRC  --   --   --  0.45 0.45 0.46  CREATININE 2.59* 2.76*  --   --  3.36* 3.85*  TROPONINIHS 3,608* 3,696* 3,588*  --   --   --     Estimated Creatinine Clearance: 9.3 mL/min (A) (by C-G formula based on SCr of 3.85 mg/dL (H)).  Medications:  Medications Prior to Admission  Medication Sig Dispense Refill Last Dose  . carvedilol (COREG) 25 MG tablet Take 25 mg by mouth 2 (two) times daily.   03/26/2019 at 830pm  . clopidogrel (PLAVIX) 75 MG tablet Take 75 mg by mouth daily.   1 03/26/2019 at 5pm  . cyanocobalamin (,VITAMIN B-12,) 1000 MCG/ML injection Inject 1,000 mcg into the muscle every 30 (thirty) days. On the 28th   03/24/2019 at 7pm  . felodipine (PLENDIL) 5 MG 24 hr tablet Take 5 mg by mouth daily.   03/26/2019 at 930am  . ferrous sulfate 325 (65 FE) MG tablet Take 325 mg by mouth daily with breakfast.   03/26/2019 at 930am  . fluticasone (FLONASE) 50 MCG/ACT nasal spray Place 1 spray into both nostrils daily.   03/26/2019 at 5pm  . glipiZIDE (GLUCOTROL) 5 MG tablet Take 5 mg by  mouth daily.   03/26/2019 at 530am  . hydrALAZINE (APRESOLINE) 25 MG tablet Take 25 mg by mouth 3 (three) times daily.   03/26/2019 at 830pm  . ketoconazole (NIZORAL) 2 % shampoo Apply 1 application topically as directed. Tuesday and fridays with showers   03/24/2019 at 830am  . levothyroxine (LEVOXYL) 50 MCG tablet Take 50 mcg by mouth daily before breakfast.   03/26/2019 at 530am  . lisinopril (ZESTRIL) 20 MG tablet Take 20 mg by mouth 2 (two) times daily.   03/26/2019 at 830pm  . loratadine (CLARITIN) 10 MG tablet Take 10 mg by mouth daily.   03/26/2019 at 930am  . Multiple Vitamin (MULTIVITAMIN WITH MINERALS) TABS tablet Take 1 tablet by mouth daily.   03/26/2019 at 5pm  . OLANZapine (ZYPREXA) 15 MG tablet Take 15 mg by mouth at bedtime.   03/26/2019 at 830pm  . sodium bicarbonate 650 MG tablet Take 650 mg by mouth 2 (two) times daily.   03/26/2019 at 830pm  . terazosin (HYTRIN) 5 MG capsule Take 5 mg by mouth at bedtime.   03/26/2019 at 930pm  . vitamin C (ASCORBIC ACID) 500 MG tablet Take 500 mg by mouth daily.   03/26/2019  at 930am  . Vitamin D, Ergocalciferol, (DRISDOL) 50000 UNITS CAPS capsule Take 50,000 Units by mouth as directed. Monthly on the 19th  1 03/15/2019 at 8am  . felodipine (PLENDIL) 10 MG 24 hr tablet Take 1 tablet (10 mg total) by mouth daily. (Patient not taking: Reported on 03/04/2019) 30 tablet 0 Not Taking at Unknown time  . hydrALAZINE (APRESOLINE) 50 MG tablet Take 1 tablet (50 mg total) by mouth every 8 (eight) hours. (Patient not taking: Reported on 03/05/2019) 90 tablet 0 Not Taking at Unknown time  . Maltodextrin-Xanthan Gum (RESOURCE THICKENUP CLEAR) POWD Take 120 g by mouth as needed (with all liquids). (Patient not taking: Reported on 03/26/2017) 10 Can 0 Not Taking at Unknown time   Scheduled:  . aspirin EC  81 mg Oral Daily  . atorvastatin  40 mg Oral q1800  . carvedilol  25 mg Oral BID  . clopidogrel  75 mg Oral q1800  . fluticasone  1 spray Each Nare q1800  . insulin  aspart  0-5 Units Subcutaneous QHS  . insulin aspart  0-9 Units Subcutaneous TID WC  . ipratropium-albuterol  3 mL Nebulization TID  . levothyroxine  50 mcg Oral Q0600  . loratadine  10 mg Oral Daily  . methylPREDNISolone (SOLU-MEDROL) injection  60 mg Intravenous Q12H  . OLANZapine  15 mg Oral QHS  . sodium bicarbonate  650 mg Oral BID  . terazosin  5 mg Oral QHS    Assessment: 86 yoF with PMH DM2, HTN, HLD, CKD4, hypothyroid, CVA on Plavix, admitted with hypoxia. Now with troponins elevated 100x ULN, Cardiology requesting IV heparin with Pharmacy to dose.   Baseline INR, aPTT: not done  Anticoagulation prior to admission: none  Last dose Lovenox ppx at 10p (8/31)  Plavix PTA, Cards adding ASA 81 now with UFH  Significant events:  Today, 03/30/2019:  CBC: Hgb low but stable; Plt stable WNL  Most recent heparin level therapeutic and stable this AM  SCr elevated above baseline (baseline ~1.5-2.0) and trending up  No bleeding or infusion issues per nursing  Continues on ASA + Plavix  Goal of Therapy: Heparin level 0.3-0.7 units/ml Monitor platelets by anticoagulation protocol: Yes  Plan:  Continue heparin 600 units/hr IV infusion  Daily heparin level and CBC  Monitor for signs of bleeding or thrombosis  Continuing medical management per Cardiology   Reuel Boom, PharmD, BCPS [phone] 03/30/2019, 8:23 AM

## 2019-03-30 NOTE — Progress Notes (Signed)
Results for TRINADEE, RAYBON (MRN [id number]) as of 03/30/2019 13:30  Ref. Range 03/29/2019 11:14 03/29/2019 16:08 03/29/2019 21:50 03/30/2019 07:53 03/30/2019 12:07  Glucose-Capillary Latest Ref Range: 70 - 99 mg/dL 331 (H) 255 (H) 226 (H) 210 (H) 314 (H)  Noted that blood sugars continue to be elevated. Remains on steroids.   Recommend adding Lantus 5 units daily and increasing Novolog correction scale to MODERATE (0-15 units ) TID & HS scale, especially while on steroids.   Harvel Ricks RN BSN CDE Diabetes Coordinator Pager: [phone]  8am-5pm

## 2019-03-30 NOTE — Progress Notes (Signed)
[facility]  PROGRESS NOTE  Joelyn Andreason [id number] DOB: 1935-06-20 DOA: 03/22/2019 PCP: Crist Infante, MD   Brief HPI:   84 year old female with history of diabetes mellitus type 2, hypertension, hyperlipidemia, stage IV CKD, schizoaffective disorder, hypothyroidism admitted on 02/25/2019 complains of shortness of breath.  Found to have non-STEMI and acute systolic CHF.  Cardiology was consulted.    Subjective   Patient seen and examined, breathing better this morning.  Denies chest pain.   Assessment/Plan:     1. Acute systolic CHF-EF 30 to [id number] on echocardiogram with hypokinesis of the left ventricle and apical area as well as dyskinesis of the left apical segment.  Patient on IV diuresis with Lasix.  Cardiology following. Will discontinue Solu-Medrol.  2. Question community-acquired pneumonia-patient was empirically started on ceftriaxone and Zithromax at the time of admission for possible CAP.  At this time, does not appear patient has infectious process.  Will discontinue antibiotics at this time.  3. Acute kidney injury on CKD stage IV-patient baseline creatinine 1.5, creatinine is rising today is 3.85.  Palliative care was consulted and family wants to continue with medical management for now.  Patient is not a good candidate for hemodialysis.  Will consult nephrology for further recommendations.  4. Hypertension-blood pressures controlled, continue home medications.  5. Type 2 diabetes mellitus-continue sliding scale insulin with NovoLog.  6. Schizoaffective disorder-patient is on multiple psychotropic medications.  7. Hypothyroidism-continue Synthroid.    CBG: Recent Labs  Lab 03/29/19 1114 03/29/19 1608 03/29/19 2150 03/30/19 0753 03/30/19 1207  GLUCAP 331* 255* 226* 210* 314*    CBC: Recent Labs  Lab 03/03/2019 0835 03/28/19 0042 03/29/19 0456 03/30/19 0512  WBC 14.3* 14.5* 15.9* 15.2*  NEUTROABS 12.4*  --   --   --   HGB 9.9* 9.7* 8.6*  9.1*  HCT 32.5* 29.8* 28.3* 29.1*  MCV 101.9* 97.4 99.6 101.0*  PLT 199 212 198 [id number]    Basic Metabolic Panel: Recent Labs  Lab 03/11/2019 0835 03/12/2019 1533 03/28/19 0042 03/29/19 0456 03/30/19 0512  NA 136 137 138 140 137  K 5.5* 5.5* 4.4 4.4 4.4  CL 108 109 107 108 105  CO2 16* 14* 18* 20* 17*  GLUCOSE 212* 176* 184* 207* 202*  BUN 40* 40* 45* 65* 81*  CREATININE 2.58* 2.59* 2.76* 3.36* 3.85*  CALCIUM 8.9 8.6* 8.6* 8.2* 7.9*  MG  --   --   --  2.1  --      DVT prophylaxis: Heparin  Code Status: Full code  Family Communication: Discussed with patient's daughter at bedside  Disposition Plan: To be decided based on patient's clinical course in the hospital     BMI  Estimated body mass index is 22.66 kg/m as calculated from the following:   Height as of this encounter: 5\' 3"  (1.6 m).   Weight as of this encounter: 58 kg.  Scheduled medications:  . aspirin EC  81 mg Oral Daily  . atorvastatin  40 mg Oral q1800  . carvedilol  25 mg Oral BID  . clopidogrel  75 mg Oral q1800  . fluticasone  1 spray Each Nare q1800  . heparin  5,000 Units Subcutaneous Q8H  . insulin aspart  0-5 Units Subcutaneous QHS  . insulin aspart  0-9 Units Subcutaneous TID WC  . ipratropium-albuterol  3 mL Nebulization TID  . levothyroxine  50 mcg Oral Q0600  . loratadine  10 mg Oral Daily  . methylPREDNISolone (SOLU-MEDROL) injection  60 mg Intravenous Q12H  .  OLANZapine  15 mg Oral QHS  . sodium bicarbonate  650 mg Oral BID  . terazosin  5 mg Oral QHS    Consultants:  Cardiology  Procedures:     Antibiotics:   Anti-infectives (From admission, onward)   Start     Dose/Rate Route Frequency Ordered Stop   03/11/2019 0900  cefTRIAXone (ROCEPHIN) 2 g in sodium chloride 0.9 % 100 mL IVPB  Status:  Discontinued     2 g 200 mL/hr over 30 Minutes Intravenous Every 24 hours 03/06/2019 0859 03/30/19 0838   03/04/2019 0900  azithromycin (ZITHROMAX) 500 mg in sodium chloride 0.9 % 250 mL IVPB   Status:  Discontinued     500 mg 250 mL/hr over 60 Minutes Intravenous Every 24 hours 02/27/2019 0859 03/30/19 0838       Objective   Vitals:   03/30/19 0240 03/30/19 0500 03/30/19 0726 03/30/19 1336  BP:  (!) 144/80  134/75  Pulse:  79  77  Resp:  16  18  Temp:  98 F (36.7 C)  98.4 F (36.9 C)  TempSrc:  Oral  Oral  SpO2:  98% 96% 98%  Weight: 58 kg     Height:        Intake/Output Summary (Last 24 hours) at 03/30/2019 1506 Last data filed at 03/30/2019 1500 Gross per 24 hour  Intake 308.41 ml  Output 800 ml  Net -491.59 ml   Filed Weights   03/28/19 0447 03/29/19 0500 03/30/19 0240  Weight: 52.4 kg 53.4 kg 58 kg     Physical Examination:    General: Appears in no acute distress  Cardiovascular: S1-S2, regular, no murmur auscultated  Respiratory: Clear to auscultation bilaterally  Abdomen: Soft, nontender, no organomegaly  Extremities: No edema in the lower extremities  Neurologic: Alert, oriented x3, no focal deficit noted     Data Reviewed: I have personally reviewed following labs and imaging studies   Recent Results (from the past 240 hour(s))  Blood Culture (routine x 2)     Status: None (Preliminary result)   Collection Time: 03/10/2019  9:00 AM   Specimen: BLOOD LEFT HAND  Result Value Ref Range Status   Specimen Description   Final    BLOOD LEFT HAND Performed at [facility], [address], [place], [place] [address]    Special Requests   Final    BOTTLES DRAWN AEROBIC AND ANAEROBIC Blood Culture adequate volume Performed at [facility] [address], [place], [place] [address]    Culture   Final    NO GROWTH 3 DAYS Performed at [facility], [place] [address], [place], [place] [address]    Report Status PENDING  Incomplete  SARS Coronavirus 2 [facility] order, Performed in [facility] hospital lab) Nasopharyngeal Nasopharyngeal Swab     Status: None   Collection Time: 03/17/2019  9:00 AM    Specimen: Nasopharyngeal Swab  Result Value Ref Range Status   SARS Coronavirus 2 NEGATIVE NEGATIVE Final    Comment: (NOTE) If result is NEGATIVE SARS-CoV-2 target nucleic acids are NOT DETECTED. The SARS-CoV-2 RNA is generally detectable in upper and lower  respiratory specimens during the acute phase of infection. The lowest  concentration of SARS-CoV-2 viral copies this assay can detect is 250  copies / mL. A negative result does not preclude SARS-CoV-2 infection  and should not be used as the sole basis for treatment or other  patient management decisions.  A negative result may  occur with  improper specimen collection / handling, submission of specimen other  than nasopharyngeal swab, presence of viral mutation(s) within the  areas targeted by this assay, and inadequate number of viral copies  (<250 copies / mL). A negative result must be combined with clinical  observations, patient history, and epidemiological information. If result is POSITIVE SARS-CoV-2 target nucleic acids are DETECTED. The SARS-CoV-2 RNA is generally detectable in upper and lower  respiratory specimens dur ing the acute phase of infection.  Positive  results are indicative of active infection with SARS-CoV-2.  Clinical  correlation with patient history and other diagnostic information is  necessary to determine patient infection status.  Positive results do  not rule out bacterial infection or co-infection with other viruses. If result is PRESUMPTIVE POSTIVE SARS-CoV-2 nucleic acids MAY BE PRESENT.   A presumptive positive result was obtained on the submitted specimen  and confirmed on repeat testing.  While 2019 novel coronavirus  (SARS-CoV-2) nucleic acids may be present in the submitted sample  additional confirmatory testing may be necessary for epidemiological  and / or clinical management purposes  to differentiate between  SARS-CoV-2 and other Sarbecovirus currently known to infect humans.  If  clinically indicated additional testing with an alternate test  methodology [phone]) is advised. The SARS-CoV-2 RNA is generally  detectable in upper and lower respiratory sp ecimens during the acute  phase of infection. The expected result is Negative. Fact Sheet for Patients:  StrictlyIdeas.no Fact Sheet for Healthcare Providers: BankingDealers.co.za This test is not yet approved or cleared by the [place] FDA and has been authorized for detection and/or diagnosis of SARS-CoV-2 by FDA under an Emergency Use Authorization (EUA).  This EUA will remain in effect (meaning this test can be used) for the duration of the COVID-19 declaration under Section 564(b)(1) of the Act, 21 U.S.C. section 360bbb-3(b)(1), unless the authorization is terminated or revoked sooner. Performed at [facility], [place] [address] [place], [place] [address]   Blood Culture (routine x 2)     Status: None (Preliminary result)   Collection Time: 03/16/2019  2:13 PM   Specimen: BLOOD  Result Value Ref Range Status   Specimen Description   Final    BLOOD LEFT ARM Performed at [facility] [address] [place], [place] [address]    Special Requests   Final    BOTTLES DRAWN AEROBIC ONLY Blood Culture adequate volume Performed at [facility] [address], [place],  [place] [address]    Culture   Final    NO GROWTH 3 DAYS Performed at [facility], [place] [address], [place], [place] [address]    Report Status PENDING  Incomplete      BNP (last 3 results) Recent Labs    03/05/2019 0835  BNP 1,026.3*     Admission status: Inpatient: Based on patients clinical presentation and evaluation of above clinical data, I have made determination that patient meets Inpatient criteria at this time.  Time spent: 20 minutes  [facility] Pager [phone]. If 7PM-7AM, please  contact night-coverage at [email or web address], Office  [phone]  password [email or web address]  03/30/2019, 3:06 PM  LOS: 3 days

## 2019-03-30 NOTE — Care Management Important Message (Signed)
Important Message  Patient Details IM Letter given to Dessa Phi RN to present to the Patient Name: Ashley Velez MRN: [id number] Date of Birth: 06-11-1935   Medicare Important Message Given:  Yes     Kerin Salen 03/30/2019, 11:57 AM

## 2019-03-30 NOTE — Consult Note (Signed)
Renal Service Consult Note [facility]  Liridona Mashaw 03/30/2019 [place] Requesting Physician:  Dr Darrick Meigs  Reason for Consult:  Renal failure HPI: The patient is a 84 y.o. year-old with hx of HTN, HL, CKD 3/4, DM2 admitted on 03/26/2019 for SOB /wheezing and hypoxemia.  CXR showed new basilar infiltrates question PNA/ aspiration. Pt was admitted and started on Nebs, IV steroids and IV lasix. BP's were high requiring home BP meds and prn IV meds. Creat 1.59 on admission is rising up to 2.5 yest and 3.8 today. Asked to see for renal failure.   Pt got IV lasix 20- 80 mg a few doses since admission. Recorded UOP was 400cc 8/31, then 500 cc 9/1 then 900 cc yest.  BP's were normal to high on admission and today are wnl. Pt is a vague historian and is very tired but Ox 3.      Date   Creat  eGFR  2018  1.40- 1.85 21- 32, stage IIIb-IV  03/11/2019 1.59  03/28/19  2.58  03/29/19  3.36  03/30/19  3.85   ROS  denies CP  no joint pain   no HA  no blurry vision  no rash  no diarrhea  no nausea/ vomiting  no dysuria  no difficulty voiding  no change in urine color    Past Medical History  Past Medical History:  Diagnosis Date  . Acute respiratory failure ([place]) 09/2016  . Diabetes mellitus without complication ([place])   . Epilepsy ([place])   . Hyperlipidemia   . Hypertension   . Renal disorder    Stage 4 CKD  . Schizoaffective disorder ([place])   . Stroke ([place])   . Thyroid disease    hypothyroid  . Vitamin D deficiency    Past Surgical History History reviewed. No pertinent surgical history. Family History No family history on file. Social History  reports that she has never smoked. She has never used smokeless tobacco. She reports that she does not drink alcohol or use drugs. Allergies  Allergies  Allergen Reactions  . Fosamax [Alendronate] Other (See Comments)    Noted to be an allergy on the patient's paperwork from facility, but no reaction is recorded  .  Macrodantin [Nitrofurantoin] Other (See Comments)    Noted to be an allergy on the patient's paperwork from facility, but no reaction is recorded   Home medications Prior to Admission medications   Medication Sig Start Date End Date Taking? Authorizing Provider  carvedilol (COREG) 25 MG tablet Take 25 mg by mouth 2 (two) times daily. 03/05/19  Yes [provider]  clopidogrel (PLAVIX) 75 MG tablet Take 75 mg by mouth daily.  08/22/15  Yes [provider]  cyanocobalamin (,VITAMIN B-12,) 1000 MCG/ML injection Inject 1,000 mcg into the muscle every 30 (thirty) days. On the 28th   Yes [provider]  felodipine (PLENDIL) 5 MG 24 hr tablet Take 5 mg by mouth daily. 03/26/19  Yes [provider]  ferrous sulfate 325 (65 FE) MG tablet Take 325 mg by mouth daily with breakfast.   Yes [provider]  fluticasone (FLONASE) 50 MCG/ACT nasal spray Place 1 spray into both nostrils daily.   Yes [provider]  glipiZIDE (GLUCOTROL) 5 MG tablet Take 5 mg by mouth daily. 03/10/19  Yes [provider]  hydrALAZINE (APRESOLINE) 25 MG tablet Take 25 mg by mouth 3 (three) times daily.   Yes [provider]  ketoconazole (NIZORAL) 2 % shampoo Apply  1 application topically as directed. Tuesday and fridays with showers 03/09/19  Yes [provider]  levothyroxine (LEVOXYL) 50 MCG tablet Take 50 mcg by mouth daily before breakfast.   Yes [provider]  lisinopril (ZESTRIL) 20 MG tablet Take 20 mg by mouth 2 (two) times daily. 03/16/19  Yes [provider]  loratadine (CLARITIN) 10 MG tablet Take 10 mg by mouth daily.   Yes [provider]  Multiple Vitamin (MULTIVITAMIN WITH MINERALS) TABS tablet Take 1 tablet by mouth daily.   Yes [provider]  OLANZapine (ZYPREXA) 15 MG tablet Take 15 mg by mouth at bedtime. 03/26/19  Yes [provider]  sodium bicarbonate 650 MG tablet Take 650 mg by mouth 2  (two) times daily.   Yes [provider]  terazosin (HYTRIN) 5 MG capsule Take 5 mg by mouth at bedtime. 02/28/19  Yes [provider]  vitamin C (ASCORBIC ACID) 500 MG tablet Take 500 mg by mouth daily.   Yes [provider]  Vitamin D, Ergocalciferol, (DRISDOL) 50000 UNITS CAPS capsule Take 50,000 Units by mouth as directed. Monthly on the 19th 12/31/14  Yes [provider]  felodipine (PLENDIL) 10 MG 24 hr tablet Take 1 tablet (10 mg total) by mouth daily. Patient not taking: Reported on 03/11/2019 01/12/17   Dhungel, Flonnie Overman, MD  hydrALAZINE (APRESOLINE) 50 MG tablet Take 1 tablet (50 mg total) by mouth every 8 (eight) hours. Patient not taking: Reported on 03/06/2019 03/27/17   Thurnell Lose, MD  Maltodextrin-Xanthan Gum (RESOURCE THICKENUP CLEAR) POWD Take 120 g by mouth as needed (with all liquids). Patient not taking: Reported on 08/29/202018 01/12/17   Dhungel, Flonnie Overman, MD   Liver Function Tests No results for input(s): AST, ALT, ALKPHOS, BILITOT, PROT, ALBUMIN in the last 168 hours. No results for input(s): LIPASE, AMYLASE in the last 168 hours. CBC Recent Labs  Lab 03/03/2019 0835 03/28/19 0042 03/29/19 0456 03/30/19 0512  WBC 14.3* 14.5* 15.9* 15.2*  NEUTROABS 12.4*  --   --   --   HGB 9.9* 9.7* 8.6* 9.1*  HCT 32.5* 29.8* 28.3* 29.1*  MCV 101.9* 97.4 99.6 101.0*  PLT 199 212 198 798   Basic Metabolic Panel Recent Labs  Lab 03/21/2019 0835 03/18/2019 1533 03/28/19 0042 03/29/19 0456 03/30/19 0512  NA 136 137 138 140 137  K 5.5* 5.5* 4.4 4.4 4.4  CL 108 109 107 108 105  CO2 16* 14* 18* 20* 17*  GLUCOSE 212* 176* 184* 207* 202*  BUN 40* 40* 45* 65* 81*  CREATININE 2.58* 2.59* 2.76* 3.36* 3.85*  CALCIUM 8.9 8.6* 8.6* 8.2* 7.9*   Iron/TIBC/Ferritin/ %Sat    Component Value Date/Time   IRON 19 (L) 03/17/2019 1413   TIBC 246 (L) 03/20/2019 1413   FERRITIN 244 03/17/2019 1413   IRONPCTSAT 8 (L) 03/26/2019 1413    Vitals:   03/30/19 0240  03/30/19 0500 03/30/19 0726 03/30/19 1336  BP:  (!) 144/80  134/75  Pulse:  79  77  Resp:  16  18  Temp:  98 F (36.7 C)  98.4 F (36.9 C)  TempSrc:  Oral  Oral  SpO2:  98% 96% 98%  Weight: 58 kg     Height:        Exam Gen lethargic elderly WF but Ox 3, slurred speech No rash, cyanosis or gangrene Sclera anicteric, throat clear  No jvd or bruits Chest some scattered basilar crackles bilat, no wheezing RRR no MRG Abd soft ntnd  no mass or ascites +bs GU foley draining clear urine in good amts MS no joint effusions or deformity Ext no sig pitting edema, no wounds or ulcers Neuro is somnolent , arouses easily, gen'd very weak in bed    Home meds:  - carvedilol 25 bid/ felodipine 5 / hydralazine 25 tid / lisinopril 20 bid/ terazosin 5 hs  - olanzapine 34m hs  - clopidogrel 75   - levothyroxine 50 am  - glipizide 5 qd  - prn's/ vitamins/ supplements   UA 8/31 > negative except 100 prot and rare bact  UNa 94 , UCr 63   Renal UKoreashowed 8.7 and 7.4 cm kidneys w/o hydro , and +^'d echo CXR on 8/31 shows question RLL infiltrate, no edeam   Assessment/ Plan: 1. AKI on CKD 3/4 - underlying renal fxn is poor w/ small kidneys on UKoreaconsistent w/ advanced renal failure.  No nephrotoxins rec'd, no contrast or ACEi (lisinopril held on admission).  Possibly due to diuresis. On exam today does not appear vol overloaded. Agree poor HD candidate. Will start her on IVF's at 51-75 cc/ hr. Cont to hold her ACEi for now. Will follow.  2. HTN - BP's well controlled here w/ coreg and Hytrin 3. RLL asp PNA - on IV abx 4. DM2 per primary      RKelly Splinter MD 03/30/2019, 2:13 PM

## 2019-03-30 NOTE — Plan of Care (Signed)

## 2019-03-30 NOTE — Progress Notes (Signed)
Assumed care of pt from [facility]. No changes in initial AM assessment at this time. Pt with c/o at this. Cont with plan of care

## 2019-03-30 NOTE — Progress Notes (Signed)
Patient ID: Ashley Velez, female   DOB: 1934-08-25, 84 y.o.   MRN: [id number]  This NP visited patient at the bedside as a follow up to  yesterday's [place].  Spoke to daughter and her husband  by phone for continued conversation regarding current medical situation.  We discussed her worsening renal function.  We discussed the intervention of dialysis; risks and benefits, and the importance of overall wellness for a successful dialysis therapy.    We again discussed concepts specific to human mortality and the limitations of medical interventions to prolong quality of life when the body begins to fail to thrive.  Daughter verbalizes an understanding of the seriousness of her mother's medical condition but at this point in time continues to remain optimistic for improvement.  At this time daughter is open to considering all life prolonging measures.   Questions and concerns addressed    Emotional support offered.  This nurse practitioner informed  the patient/family and the attending that I will be out of the hospital until Monday morning.  If the patient is still hospitalized I will follow-up at that time.  Call palliative medicine team phone # [phone] with questions or concerns.  Total time spent on the unit was 35 minutes     Discussed with Dr Darrick Meigs   Greater than 50% of the time was spent in counseling and coordination of care  Wadie Lessen NP  Palliative Medicine Team Team Phone # [phone] Pager [phone]

## 2019-03-30 NOTE — Progress Notes (Addendum)
Progress Note  Patient Name: Ashley Velez Date of Encounter: 03/30/2019  Primary Cardiologist: New   Subjective   Reports feeling better today. Remains mild confused. Denies chest pain or SOB.   Inpatient Medications    Scheduled Meds: . aspirin EC  81 mg Oral Daily  . atorvastatin  40 mg Oral q1800  . carvedilol  25 mg Oral BID  . clopidogrel  75 mg Oral q1800  . fluticasone  1 spray Each Nare q1800  . insulin aspart  0-5 Units Subcutaneous QHS  . insulin aspart  0-9 Units Subcutaneous TID WC  . ipratropium-albuterol  3 mL Nebulization TID  . levothyroxine  50 mcg Oral Q0600  . loratadine  10 mg Oral Daily  . methylPREDNISolone (SOLU-MEDROL) injection  60 mg Intravenous Q12H  . OLANZapine  15 mg Oral QHS  . sodium bicarbonate  650 mg Oral BID  . terazosin  5 mg Oral QHS   Continuous Infusions: . azithromycin 500 mg (03/29/19 1048)  . cefTRIAXone (ROCEPHIN)  IV 2 g (03/29/19 0851)  . heparin 600 Units/hr (03/30/19 0600)   PRN Meds: acetaminophen, HYDROcodone-acetaminophen   Vital Signs    Vitals:   03/29/19 2147 03/30/19 0240 03/30/19 0500 03/30/19 0726  BP: 140/83  (!) 144/80   Pulse: 86  79   Resp: 20  16   Temp: 98.5 F (36.9 C)  98 F (36.7 C)   TempSrc: Oral  Oral   SpO2: 100%  98% 96%  Weight:  58 kg    Height:        Intake/Output Summary (Last 24 hours) at 03/30/2019 0754 Last data filed at 03/30/2019 0630 Gross per 24 hour  Intake 732.49 ml  Output 900 ml  Net -167.51 ml   Filed Weights   03/28/19 0447 03/29/19 0500 03/30/19 0240  Weight: 52.4 kg 53.4 kg 58 kg    Physical Exam   General: Frail, elderly, NAD Lungs:Clear to ausculation bilaterally. No wheezes, rales, or rhonchi. Breathing is unlabored. Cardiovascular: RRR with S1 S2. No murmurs, rubs, gallops, or LV heave appreciated. Abdomen: Soft, non-tender, non-distended with normoactive bowel sounds. No hepatomegaly, No rebound/guarding. No obvious abdominal masses. MSK:  Strength and tone appear normal for age. 5/5 in all extremities Extremities: No edema. No clubbing or cyanosis. DP/PT pulses 2+ bilaterally Neuro: Alert and oriented. No focal deficits. No facial asymmetry. MAE spontaneously. Psych: Responds to questions appropriately with normal affect.    Labs    Chemistry Recent Labs  Lab 03/28/19 0042 03/29/19 0456 03/30/19 0512  NA 138 140 137  K 4.4 4.4 4.4  CL 107 108 105  CO2 18* 20* 17*  GLUCOSE 184* 207* 202*  BUN 45* 65* 81*  CREATININE 2.76* 3.36* 3.85*  CALCIUM 8.6* 8.2* 7.9*  GFRNONAA 15* 12* 10*  GFRAA 18* 14* 12*  ANIONGAP 13 12 15      Hematology Recent Labs  Lab 03/28/19 0042 03/29/19 0456 03/30/19 0512  WBC 14.5* 15.9* 15.2*  RBC 3.06* 2.84* 2.88*  HGB 9.7* 8.6* 9.1*  HCT 29.8* 28.3* 29.1*  MCV 97.4 99.6 101.0*  MCH 31.7 30.3 31.6  MCHC 32.6 30.4 31.3  RDW 13.7 13.8 13.5  PLT 212 198 213    Cardiac EnzymesNo results for input(s): TROPONINI in the last 168 hours. No results for input(s): TROPIPOC in the last 168 hours.   BNP Recent Labs  Lab 03/26/2019 0835  BNP 1,026.3*     DDimer No results for input(s): DDIMER in the  last 168 hours.   Radiology    No results found.  Telemetry    03/30/2019 NSR  - Personally Reviewed  ECG    No new tracing as of 03/30/2019- Personally Reviewed  Cardiac Studies   Echo 03/28/2019:  1. Mild dyskinesis of the left ventricular, entire apical segment. 2. Severe hypokinesis of the left ventricular, mid-apical anteroseptal wall and anterior wall. 3. The left ventricle has moderate-severely reduced systolic function, with an ejection fraction of 30-35%. The cavity size was normal. Left ventricular diastolic Doppler parameters are consistent with pseudonormalization. Elevated mean left atrial  pressure. 4. The right ventricle has normal systolic function. The cavity was normal. There is no increase in right ventricular wall thickness. Right ventricular systolic  pressure is mildly elevated. 5. Left atrial size was mildly dilated. 6. Trivial pericardial effusion is present. 7. The mitral valve is grossly normal. Mild thickening of the mitral valve leaflet. Mitral valve regurgitation is moderate by color flow Doppler. The MR jet is centrally-directed. 8. The aortic valve is tricuspid. Mild thickening of the aortic valve. Aortic valve regurgitation is trivial by color flow Doppler. 9. The aorta is normal unless otherwise noted. 10. The inferior vena cava was dilated in size with <50% respiratory variability. 11. No intracardiac thrombi or masses were visualized with Definity contrast imaging.  Echo 05/2013 Study Conclusions   - Left ventricle: The cavity size was normal. Wall thickness  was increased in a pattern of mild LVH. There was mild  focal basal hypertrophy of the septum. Systolic function  was normal. The estimated ejection fraction was in the  range of 55% to 65%. Wall motion was normal; there were no  regional wall motion abnormalities. Doppler parameters are  consistent with abnormal left ventricular relaxation  (grade 1 diastolic dysfunction).  - Aortic valve: Trivial regurgitation.  - Mitral valve: Calcified annulus. Mild regurgitation.  - Pericardium, extracardiac: A trivial pericardial effusion  was identified.  Patient Profile     84 y.o. female with a hx of DM, HTN, HLD, CKD-4, schizoaffective disorder, hypothyroidism, and strokewho is being seen today for the evaluation of elevated troponin after admit for hypoxia, CHF with BNP of 1026at the request of Dr. Posey Pronto.  Assessment & Plan    1. NSTEMI: -Late presenting NSTEMI with HsT >3000 -Echocardiogram from 03/28/2019 with mild dyskinesis of the left ventricular and entire apical segment, severe hypokinesis of the left ventricular, mid-apical anteroseptal wall and anterior wall with an LV EF of 30-35% -Pt comfortable, continues to be mildly  confused>>not a candidate for further invasive testing in the setting of disorientation, frailty and severe AKI>>medical management only  -Likely palliative care per primary team notes  -Risk stratify with statin, BB and ASA>>statin initiated  -On Plavix for hx of CVA -Can stop IV Hep gtt today  -HsT down-trending>>3696>3588 -No anginal symptoms   2. Presumed ischemic cardiomyopathy: -Echo results as above with LVEF of 25-30% -Diuretics attempted once again 03/29/2019>>will hold today given worsening creatinine at 3.85 from 3.36 on 09/02 -Weight, 127lb today>>up from 117lb on 09/02>>? Accuracy  -I&O, net positive 222ml>>>poor UO -No ACE/ARB in the setting of AKI -Continue carvedilol 25mg  BID  3. Acute on chronic kidney disease Stage IV: -Creatinine,  2.59>2.76>3.36>3.85  -Baseline appears to be in the 1.5 range -Will hold diuretics today given worsening renal function with diuretic therapy and poor UO    Signed, Kathyrn Drown NP-C [facility] Pager: [phone] 03/30/2019, 7:54 AM     For questions or updates, please contact  Please consult [email or web address] for contact info under Cardiology/STEMI.  Patient seen and examined.  Agree with above documentation.  On exam, patient is somnolent but arousable.  Mental status is improved today, she is oriented x3.  She denies any chest pain or shortness of breath.  Renal function continues to worsen, trending up to 3.85 from 3.36.  She was net -170 cc yesterday on Lasix 80 mg IV and metolazone 2.5 mg.  Telemetry personally reviewed, which shows sinus rhythm with rates 70s to 90s.  She had a 16 beat run of SVT.  In regards to her NSTEMI, she has completed over 48 hours on the heparin drip.  We will discontinue heparin drip.  Continue aspirin and Plavix.  Her renal function continues to worsen, agree with nephrology consult.  For her heart failure with reduced EF, will continue carvedilol 25 mg twice daily.  No ACE or ARB in the setting of her  AKI.

## 2019-03-31 DIAGNOSIS — N184 Chronic kidney disease, stage 4 (severe): Secondary | ICD-10-CM

## 2019-03-31 LAB — GLUCOSE, CAPILLARY
Glucose-Capillary: 207 mg/dL — ABNORMAL HIGH (ref 70–99)
Glucose-Capillary: 176 mg/dL — ABNORMAL HIGH (ref 70–99)
Glucose-Capillary: 147 mg/dL — ABNORMAL HIGH (ref 70–99)
Glucose-Capillary: 182 mg/dL — ABNORMAL HIGH (ref 70–99)

## 2019-03-31 LAB — CBC
HCT: 29.4 % — ABNORMAL LOW (ref 36.0–46.0)
Hemoglobin: 9.4 g/dL — ABNORMAL LOW (ref 12.0–15.0)
MCH: 30.7 pg (ref 26.0–34.0)
MCHC: 32 g/dL (ref 30.0–36.0)
MCV: 96.1 fL (ref 80.0–100.0)
Platelets: 188 10*3/uL (ref 150–400)
RBC: 3.06 MIL/uL — ABNORMAL LOW (ref 3.87–5.11)
RDW: 13.2 % (ref 11.5–15.5)
WBC: 13.7 10*3/uL — ABNORMAL HIGH (ref 4.0–10.5)
nRBC: 0 % (ref 0.0–0.2)

## 2019-03-31 LAB — BASIC METABOLIC PANEL
Anion gap: 14 (ref 5–15)
BUN: 89 mg/dL — ABNORMAL HIGH (ref 8–23)
CO2: 17 mmol/L — ABNORMAL LOW (ref 22–32)
Calcium: 7.9 mg/dL — ABNORMAL LOW (ref 8.9–10.3)
Chloride: 105 mmol/L (ref 98–111)
Creatinine, Ser: 3.84 mg/dL — ABNORMAL HIGH (ref 0.44–1.00)
GFR calc Af Amer: 12 mL/min — ABNORMAL LOW (ref >60)
GFR calc non Af Amer: 10 mL/min — ABNORMAL LOW (ref >60)
Glucose, Bld: 205 mg/dL — ABNORMAL HIGH (ref 70–99)
Potassium: 5 mmol/L (ref 3.5–5.1)
Sodium: 136 mmol/L (ref 135–145)

## 2019-03-31 NOTE — Progress Notes (Signed)
[facility] Progress Note  Subjective: no c/o  Vitals:   03/31/19 0406 03/31/19 0406 03/31/19 0720 03/31/19 1310  BP:  (!) 154/80    Pulse:  72    Resp:  17    Temp:  (!) 97.5 F (36.4 C)    TempSrc:  Oral    SpO2:  100% 98% 99%  Weight: 57.1 kg     Height:        Inpatient medications: . aspirin EC  81 mg Oral Daily  . atorvastatin  40 mg Oral q1800  . carvedilol  25 mg Oral BID  . clopidogrel  75 mg Oral q1800  . fluticasone  1 spray Each Nare q1800  . heparin  5,000 Units Subcutaneous Q8H  . insulin aspart  0-5 Units Subcutaneous QHS  . insulin aspart  0-9 Units Subcutaneous TID WC  . ipratropium-albuterol  3 mL Nebulization TID  . levothyroxine  50 mcg Oral Q0600  . loratadine  10 mg Oral Daily  . OLANZapine  15 mg Oral QHS  . sodium bicarbonate  650 mg Oral BID  . terazosin  5 mg Oral QHS   . sodium chloride 65 mL/hr at 03/31/19 7510   acetaminophen, HYDROcodone-acetaminophen    Exam: Gen lethargic elderly WF but Ox 3, slurred speech No rash, cyanosis or gangrene Sclera anicteric, throat clear  No jvd or bruits Chest some scattered basilar crackles bilat, no wheezing RRR no MRG Abd soft ntnd no mass or ascites +bs GU foley draining clear urine in good amts MS no joint effusions or deformity Ext no sig pitting edema, no wounds or ulcers Neuro is somnolent , arouses easily, gen'd very weak in bed  Date                Creat               eGFR  2018               1.40- 1.85        21- 32, stage IIIb-IV  03/10/2019           1.59  03/28/19             2.58  03/29/19             3.36  03/30/19             3.85   Home meds:  - carvedilol 25 bid/ felodipine 5 / hydralazine 25 tid / lisinopril 20 bid/ terazosin 5 hs  - olanzapine 96m hs  - clopidogrel 75   - levothyroxine 50 am  - glipizide 5 qd  - prn's/ vitamins/ supplements   UA 8/31 > negative except 100 prot and rare bact  UNa 94 , UCr 63   Renal UKoreashowed 8.7 and 7.4 cm kidneys w/o  hydro , and +^'d echo CXR on 8/31 shows question RLL infiltrate, no edeam   Assessment/ Plan: # AKI on CKD 3/4 - underlying renal fxn is poor w/ small kidneys on UKoreaconsistent w/ advanced renal failure.  No nephrotoxins rec'd, no contrast or ACEi (lisinopril held on admission). Creat bump due to diuresis. Agree poor HD candidate. Diuretics dc'd, giving back cautiously IVF's. Creat stable today, not rising.  - cont IVF"s at 65 cc/ hr - cont hold ACEi  # HTN - BP's well controlled here w/ coreg and Hytrin - could add felodipine if needed  # RLL asp PNA - on IV abx  #  DM2 per primary     [facility] 03/31/2019, 4:59 PM  Iron/TIBC/Ferritin/ %Sat    Component Value Date/Time   IRON 19 (L) 03/21/2019 1413   TIBC 246 (L) 03/25/2019 1413   FERRITIN 244 03/13/2019 1413   IRONPCTSAT 8 (L) 03/25/2019 1413   Recent Labs  Lab 03/31/19 0832  NA 136  K 5.0  CL 105  CO2 17*  GLUCOSE 205*  BUN 89*  CREATININE 3.84*  CALCIUM 7.9*   No results for input(s): AST, ALT, ALKPHOS, BILITOT, PROT in the last 168 hours. Recent Labs  Lab 03/31/19 0832  WBC 13.7*  HGB 9.4*  HCT 29.4*  PLT 188

## 2019-03-31 NOTE — Progress Notes (Addendum)
[facility]  PROGRESS NOTE  Ashley Velez [id number] DOB: 12/18/1934 DOA: 02/26/2019 PCP: Crist Infante, MD   Brief HPI:   84 year old female with history of diabetes mellitus type 2, hypertension, hyperlipidemia, stage IV CKD, schizoaffective disorder, hypothyroidism admitted on 03/01/2019 complains of shortness of breath.  Found to have non-STEMI and acute systolic CHF.  Cardiology was consulted.    Subjective   Patient seen and examined, somnolent this morning, but arousable.   Assessment/Plan:     1. Acute systolic CHF-EF 30 to [id number] on echocardiogram with hypokinesis of the left ventricle and apical area as well as dyskinesis of the left apical segment.  Patient on IV diuresis with Lasix.  Cardiology following.    2. Question community-acquired pneumonia-patient was empirically started on ceftriaxone and Zithromax at the time of admission for possible CAP.  At this time, does not appear patient has infectious process.  Will discontinue antibiotics at this time.  Solu-Medrol has been discontinued.  3. Acute kidney injury on CKD stage IV-patient baseline creatinine 1.5, creatinine is rising today is 3.85.  Palliative care was consulted and family wants to continue with medical management for now.  Patient is not a good candidate for hemodialysis.  Nephrology consulted.  Started on IV normal saline per nephrology.  Creatinine is stable at 3.84.  Follow BMP in a.m.  4. Hypertension-blood pressures controlled, continue home medications.  5. Type 2 diabetes mellitus-continue sliding scale insulin with NovoLog.  CBG well controlled.  6. Schizoaffective disorder-patient is on multiple psychotropic medications.  7. Hypothyroidism-continue Synthroid.    CBG: Recent Labs  Lab 03/30/19 0753 03/30/19 1207 03/30/19 1649 03/30/19 2217 03/31/19 1153  GLUCAP 210* 314* 221* 181* 176*    CBC: Recent Labs  Lab 03/06/2019 0835 03/28/19 0042 03/29/19 0456 03/30/19 0512  03/31/19 0832  WBC 14.3* 14.5* 15.9* 15.2* 13.7*  NEUTROABS 12.4*  --   --   --   --   HGB 9.9* 9.7* 8.6* 9.1* 9.4*  HCT 32.5* 29.8* 28.3* 29.1* 29.4*  MCV 101.9* 97.4 99.6 101.0* 96.1  PLT 199 212 198 213 0000000    Basic Metabolic Panel: Recent Labs  Lab 02/26/2019 1533 03/28/19 0042 03/29/19 0456 03/30/19 0512 03/31/19 0832  NA 137 138 140 137 136  K 5.5* 4.4 4.4 4.4 5.0  CL 109 107 108 105 105  CO2 14* 18* 20* 17* 17*  GLUCOSE 176* 184* 207* 202* 205*  BUN 40* 45* 65* 81* 89*  CREATININE 2.59* 2.76* 3.36* 3.85* 3.84*  CALCIUM 8.6* 8.6* 8.2* 7.9* 7.9*  MG  --   --  2.1  --   --      DVT prophylaxis: Heparin  Code Status: DNR.  Family Communication: Discussed with patient's daughter at bedside also discussed with patient's son on phone/face time on daughter's phone.  Discussed patient's stable yet worrisome condition, also discussed that patient is not a candidate for aggressive interventions including hemodialysis if her renal function continues to get worse.  Discussed in detail aggressive intervention and the futility of performing CPR, mechanical ventilation considering patient's underlying medical conditions and advanced age.  Patient at this time does not want to be resuscitated and also understands that she is not a candidate for hemodialysis.  Both patient and family members agree with DNR.  Disposition Plan: To be decided based on patient's clinical course in the hospital     BMI  Estimated body mass index is 22.28 kg/m as calculated from the following:   Height as of this  encounter: 5\' 3"  (1.6 m).   Weight as of this encounter: 57.1 kg.  Scheduled medications:  . aspirin EC  81 mg Oral Daily  . atorvastatin  40 mg Oral q1800  . carvedilol  25 mg Oral BID  . clopidogrel  75 mg Oral q1800  . fluticasone  1 spray Each Nare q1800  . heparin  5,000 Units Subcutaneous Q8H  . insulin aspart  0-5 Units Subcutaneous QHS  . insulin aspart  0-9 Units Subcutaneous  TID WC  . ipratropium-albuterol  3 mL Nebulization TID  . levothyroxine  50 mcg Oral Q0600  . loratadine  10 mg Oral Daily  . OLANZapine  15 mg Oral QHS  . sodium bicarbonate  650 mg Oral BID  . terazosin  5 mg Oral QHS    Consultants:  Cardiology  Procedures:     Antibiotics:   Anti-infectives (From admission, onward)   Start     Dose/Rate Route Frequency Ordered Stop   03/26/2019 0900  cefTRIAXone (ROCEPHIN) 2 g in sodium chloride 0.9 % 100 mL IVPB  Status:  Discontinued     2 g 200 mL/hr over 30 Minutes Intravenous Every 24 hours 03/16/2019 0859 03/30/19 0838   03/02/2019 0900  azithromycin (ZITHROMAX) 500 mg in sodium chloride 0.9 % 250 mL IVPB  Status:  Discontinued     500 mg 250 mL/hr over 60 Minutes Intravenous Every 24 hours 03/10/2019 0859 03/30/19 0838       Objective   Vitals:   03/31/19 0406 03/31/19 0406 03/31/19 0720 03/31/19 1310  BP:  (!) 154/80    Pulse:  72    Resp:  17    Temp:  (!) 97.5 F (36.4 C)    TempSrc:  Oral    SpO2:  100% 98% 99%  Weight: 57.1 kg     Height:        Intake/Output Summary (Last 24 hours) at 03/31/2019 1349 Last data filed at 03/31/2019 [id number] Gross per 24 hour  Intake 1220.49 ml  Output 975 ml  Net 245.49 ml   Filed Weights   03/29/19 0500 03/30/19 0240 03/31/19 0406  Weight: 53.4 kg 58 kg 57.1 kg     Physical Examination:  General-appears in no acute distress Heart-S1-S2, regular, no murmur auscultated Lungs-clear to auscultation bilaterally, no wheezing or crackles auscultated Abdomen-soft, nontender, no organomegaly Extremities-trace edema in the lower extremities Neuro-alert, oriented x3, no focal deficit noted    Data Reviewed: I have personally reviewed following labs and imaging studies   Recent Results (from the past 240 hour(s))  Blood Culture (routine x 2)     Status: None (Preliminary result)   Collection Time: 03/23/2019  9:00 AM   Specimen: BLOOD LEFT HAND  Result Value Ref Range Status   Specimen  Description   Final    BLOOD LEFT HAND Performed at [facility], [place] [address] [place], [place] [address]    Special Requests   Final    BOTTLES DRAWN AEROBIC AND ANAEROBIC Blood Culture adequate volume Performed at [facility] [address], [place], [place] [address]    Culture   Final    NO GROWTH 4 DAYS Performed at [facility], [place] [address] [place], [place] [address]    Report Status PENDING  Incomplete  SARS Coronavirus 2 [facility] order, Performed in [facility] hospital lab) Nasopharyngeal Nasopharyngeal Swab     Status: None   Collection Time: 02/25/2019  9:00 AM  Specimen: Nasopharyngeal Swab  Result Value Ref Range Status   SARS Coronavirus 2 NEGATIVE NEGATIVE Final    Comment: (NOTE) If result is NEGATIVE SARS-CoV-2 target nucleic acids are NOT DETECTED. The SARS-CoV-2 RNA is generally detectable in upper and lower  respiratory specimens during the acute phase of infection. The lowest  concentration of SARS-CoV-2 viral copies this assay can detect is 250  copies / mL. A negative result does not preclude SARS-CoV-2 infection  and should not be used as the sole basis for treatment or other  patient management decisions.  A negative result may occur with  improper specimen collection / handling, submission of specimen other  than nasopharyngeal swab, presence of viral mutation(s) within the  areas targeted by this assay, and inadequate number of viral copies  (<250 copies / mL). A negative result must be combined with clinical  observations, patient history, and epidemiological information. If result is POSITIVE SARS-CoV-2 target nucleic acids are DETECTED. The SARS-CoV-2 RNA is generally detectable in upper and lower  respiratory specimens dur ing the acute phase of infection.  Positive  results are indicative of active infection with SARS-CoV-2.  Clinical  correlation with patient history and other diagnostic  information is  necessary to determine patient infection status.  Positive results do  not rule out bacterial infection or co-infection with other viruses. If result is PRESUMPTIVE POSTIVE SARS-CoV-2 nucleic acids MAY BE PRESENT.   A presumptive positive result was obtained on the submitted specimen  and confirmed on repeat testing.  While 2019 novel coronavirus  (SARS-CoV-2) nucleic acids may be present in the submitted sample  additional confirmatory testing may be necessary for epidemiological  and / or clinical management purposes  to differentiate between  SARS-CoV-2 and other Sarbecovirus currently known to infect humans.  If clinically indicated additional testing with an alternate test  methodology [phone]) is advised. The SARS-CoV-2 RNA is generally  detectable in upper and lower respiratory sp ecimens during the acute  phase of infection. The expected result is Negative. Fact Sheet for Patients:  StrictlyIdeas.no Fact Sheet for Healthcare Providers: BankingDealers.co.za This test is not yet approved or cleared by the [place] FDA and has been authorized for detection and/or diagnosis of SARS-CoV-2 by FDA under an Emergency Use Authorization (EUA).  This EUA will remain in effect (meaning this test can be used) for the duration of the COVID-19 declaration under Section 564(b)(1) of the Act, 21 U.S.C. section 360bbb-3(b)(1), unless the authorization is terminated or revoked sooner. Performed at [facility], [place] [address] [place], [place] [address]   Blood Culture (routine x 2)     Status: None (Preliminary result)   Collection Time: 02/25/2019  2:13 PM   Specimen: BLOOD  Result Value Ref Range Status   Specimen Description   Final    BLOOD LEFT ARM Performed at [facility] [address], [place], [place] [address]    Special Requests   Final    BOTTLES DRAWN AEROBIC ONLY Blood  Culture adequate volume Performed at [facility] [address], [place], [place] [address]    Culture   Final    NO GROWTH 4 DAYS Performed at [facility], [place] [address], [place], [place] [address]    Report Status PENDING  Incomplete      BNP (last 3 results) Recent Labs    03/14/2019 0835  BNP 1,026.3*     Admission status: Inpatient: Based on patients clinical presentation and  evaluation of above clinical data, I have made determination that patient meets Inpatient criteria at this time.  Time spent: 20 minutes  [facility] Pager [phone]. If 7PM-7AM, please contact night-coverage at [email or web address], Office  [phone]  password [place]  03/31/2019, 1:49 PM  LOS: 4 days

## 2019-03-31 NOTE — Plan of Care (Signed)
  Problem: Education: Goal: Knowledge of General Education information will improve Description: Including pain rating scale, medication(s)/side effects and non-pharmacologic comfort measures Outcome: Completed/Met   Problem: Pain Managment: Goal: General experience of comfort will improve Outcome: Completed/Met   Problem: Safety: Goal: Ability to remain free from injury will improve Outcome: Completed/Met

## 2019-03-31 NOTE — Progress Notes (Addendum)
Progress Note  Patient Name: Ashley Velez Date of Encounter: 03/31/2019  Primary Cardiologist: New   Subjective   Sleepy this AM. Denies chest pain or SOB.   Inpatient Medications    Scheduled Meds: . aspirin EC  81 mg Oral Daily  . atorvastatin  40 mg Oral q1800  . carvedilol  25 mg Oral BID  . clopidogrel  75 mg Oral q1800  . fluticasone  1 spray Each Nare q1800  . heparin  5,000 Units Subcutaneous Q8H  . insulin aspart  0-5 Units Subcutaneous QHS  . insulin aspart  0-9 Units Subcutaneous TID WC  . ipratropium-albuterol  3 mL Nebulization TID  . levothyroxine  50 mcg Oral Q0600  . loratadine  10 mg Oral Daily  . OLANZapine  15 mg Oral QHS  . sodium bicarbonate  650 mg Oral BID  . terazosin  5 mg Oral QHS   Continuous Infusions: . sodium chloride 65 mL/hr at 03/31/19 0652   PRN Meds: acetaminophen, HYDROcodone-acetaminophen   Vital Signs    Vitals:   03/30/19 2145 03/31/19 0406 03/31/19 0406 03/31/19 0720  BP:   (!) 154/80   Pulse:   72   Resp:   17   Temp:   (!) 97.5 F (36.4 C)   TempSrc:   Oral   SpO2: 98%  100% 98%  Weight:  57.1 kg    Height:        Intake/Output Summary (Last 24 hours) at 03/31/2019 0834 Last data filed at 03/31/2019 0742 Gross per 24 hour  Intake 1304.49 ml  Output 975 ml  Net 329.49 ml   Filed Weights   03/29/19 0500 03/30/19 0240 03/31/19 0406  Weight: 53.4 kg 58 kg 57.1 kg    Physical Exam   General: Frail, elderly, NAD Lungs:Clear to ausculation bilaterally. No wheezes, rales, or rhonchi. Breathing is unlabored. Cardiovascular: RRR with S1 S2. No murmur Abdomen: Soft, non-tender, non-distended. No obvious abdominal masses. Extremities: No edema. No clubbing or cyanosis. DP/PT pulses 2+ bilaterally Neuro: Alert and oriented to person. No focal deficits. No facial asymmetry. MAE spontaneously. Psych: Responds to questions somewhat appropriately with normal affect.    Labs    Chemistry Recent Labs  Lab  03/28/19 0042 03/29/19 0456 03/30/19 0512  NA 138 140 137  K 4.4 4.4 4.4  CL 107 108 105  CO2 18* 20* 17*  GLUCOSE 184* 207* 202*  BUN 45* 65* 81*  CREATININE 2.76* 3.36* 3.85*  CALCIUM 8.6* 8.2* 7.9*  GFRNONAA 15* 12* 10*  GFRAA 18* 14* 12*  ANIONGAP 13 12 15      Hematology Recent Labs  Lab 03/28/19 0042 03/29/19 0456 03/30/19 0512  WBC 14.5* 15.9* 15.2*  RBC 3.06* 2.84* 2.88*  HGB 9.7* 8.6* 9.1*  HCT 29.8* 28.3* 29.1*  MCV 97.4 99.6 101.0*  MCH 31.7 30.3 31.6  MCHC 32.6 30.4 31.3  RDW 13.7 13.8 13.5  PLT 212 198 213    Cardiac EnzymesNo results for input(s): TROPONINI in the last 168 hours. No results for input(s): TROPIPOC in the last 168 hours.   BNP Recent Labs  Lab 03/26/2019 0835  BNP 1,026.3*     DDimer No results for input(s): DDIMER in the last 168 hours.   Radiology    No results found.  Telemetry    03/31/2019 NSR - Personally Reviewed  ECG    No new tracing as of 03/31/2019- Personally Reviewed  Cardiac Studies   Echo 03/28/2019:  1. Mild dyskinesis  of the left ventricular, entire apical segment. 2. Severe hypokinesis of the left ventricular, mid-apical anteroseptal wall and anterior wall. 3. The left ventricle has moderate-severely reduced systolic function, with an ejection fraction of 30-35%. The cavity size was normal. Left ventricular diastolic Doppler parameters are consistent with pseudonormalization. Elevated mean left atrial  pressure. 4. The right ventricle has normal systolic function. The cavity was normal. There is no increase in right ventricular wall thickness. Right ventricular systolic pressure is mildly elevated. 5. Left atrial size was mildly dilated. 6. Trivial pericardial effusion is present. 7. The mitral valve is grossly normal. Mild thickening of the mitral valve leaflet. Mitral valve regurgitation is moderate by color flow Doppler. The MR jet is centrally-directed. 8. The aortic valve is tricuspid. Mild  thickening of the aortic valve. Aortic valve regurgitation is trivial by color flow Doppler. 9. The aorta is normal unless otherwise noted. 10. The inferior vena cava was dilated in size with <50% respiratory variability. 11. No intracardiac thrombi or masses were visualized with Definity contrast imaging.  Echo 05/2013 Study Conclusions   - Left ventricle: The cavity size was normal. Wall thickness  was increased in a pattern of mild LVH. There was mild  focal basal hypertrophy of the septum. Systolic function  was normal. The estimated ejection fraction was in the  range of 55% to 65%. Wall motion was normal; there were no  regional wall motion abnormalities. Doppler parameters are  consistent with abnormal left ventricular relaxation  (grade 1 diastolic dysfunction).  - Aortic valve: Trivial regurgitation.  - Mitral valve: Calcified annulus. Mild regurgitation.  - Pericardium, extracardiac: A trivial pericardial effusion  was identified.  Patient Profile     84 y.o. female with a hx of DM, HTN, HLD, CKD-4, schizoaffective disorder, hypothyroidism, and strokewho is being seen today for the evaluation of elevated troponin after admit for hypoxia, CHF with BNP of 1026at the request of Dr. Posey Pronto.  Assessment & Plan    1. NSTEMI: -Late presenting NSTEMI with HsT >3000 -Echocardiogram from 03/28/2019 with mild dyskinesis of the left ventricularandentire apical segment, severe hypokinesis of the left ventricular, mid-apical anteroseptal wall and anterior wallwith an LV EF of30-35% -Pt comfortable, continues to be mildly confused>>not a candidate for further invasive testing in the setting of disorientation, frailty and severe AKI>>medical management only  -Likely palliative care per primary team notes  -Risk stratify with statin, BB and ASA>>statin initiated  -On Plavix for hx of CVA -HsT down-trending>>3696>3588 -No anginal symptoms   2. Presumed  ischemic cardiomyopathy: -Echo results as above with LVEF of 25-30% -Diuretics attempted once again 03/29/2019>>will hold today given worsening creatinine at 3.85 from 3.36 on 09/02 -Weight, 125lb today>>up from 117lb on 09/02>>? Accuracy  -I&O, net positive 639ml>>>poor UO -Nephrology consulted 03/30/2019>>US consistent w/ advanced renal failure>>not a good HD candidate. Started on IVF for hydration  -No ACE/ARB in the setting of AKI -Continue carvedilol 25mg  BID  3. Acute on chronic kidney disease Stage IV: -Creatinine,  2.59>2.76>3.36>3.85>3.84 -Baseline appears to be in the 1.5 range -IVF hydration per nephrology    Signed, Kathyrn Drown NP-C [place] Pager: [phone] 03/31/2019, 8:34 AM     For questions or updates, please contact   Please consult [email or web address] for contact info under Cardiology/STEMI.  Patient seen and examined.  Agree with above documentation.  On exam, she is more somnolent today but arousable, oriented to person only, lungs are clear to auscultation bilaterally, no lower extremity edema or JVD, regular rate and rhythm, no  murmurs.  She denies shortness of breath.  Telemetry personally reviewed, which shows sinus rhythm, rates 60s to 70s.  Completed 48 hours heparin drip.  She is off anticoagulation now, but is on aspirin and Plavix.  Creatinine appears to have stabilized at 3.84 from 3.85 yesterday.  She was given IV fluids per nephrology.  Agree with holding diuretics today, will defer to nephrology concerning IV fluids.

## 2019-04-01 LAB — BASIC METABOLIC PANEL
Anion gap: 13 (ref 5–15)
BUN: 96 mg/dL — ABNORMAL HIGH (ref 8–23)
CO2: 17 mmol/L — ABNORMAL LOW (ref 22–32)
Calcium: 7.5 mg/dL — ABNORMAL LOW (ref 8.9–10.3)
Chloride: 106 mmol/L (ref 98–111)
Creatinine, Ser: 3.57 mg/dL — ABNORMAL HIGH (ref 0.44–1.00)
GFR calc Af Amer: 13 mL/min — ABNORMAL LOW (ref >60)
GFR calc non Af Amer: 11 mL/min — ABNORMAL LOW (ref >60)
Glucose, Bld: 183 mg/dL — ABNORMAL HIGH (ref 70–99)
Potassium: 4.3 mmol/L (ref 3.5–5.1)
Sodium: 136 mmol/L (ref 135–145)

## 2019-04-01 LAB — GLUCOSE, CAPILLARY
Glucose-Capillary: 153 mg/dL — ABNORMAL HIGH (ref 70–99)
Glucose-Capillary: 251 mg/dL — ABNORMAL HIGH (ref 70–99)
Glucose-Capillary: 209 mg/dL — ABNORMAL HIGH (ref 70–99)
Glucose-Capillary: 191 mg/dL — ABNORMAL HIGH (ref 70–99)

## 2019-04-01 LAB — CULTURE, BLOOD (ROUTINE X 2)
Culture: NO GROWTH
Culture: NO GROWTH
Special Requests: ADEQUATE
Special Requests: ADEQUATE

## 2019-04-01 MED ORDER — LIP MEDEX EX OINT
TOPICAL_OINTMENT | CUTANEOUS | Status: AC
  Administered 2019-04-01: 13:00:00
  Filled 2019-04-01: qty 7

## 2019-04-01 NOTE — Progress Notes (Signed)
[facility]  PROGRESS NOTE  Ashley Velez [id number] DOB: Aug 15, 1934 DOA: 03/07/2019 PCP: Crist Infante, MD   Brief HPI:   84 year old female with history of diabetes mellitus type 2, hypertension, hyperlipidemia, stage IV CKD, schizoaffective disorder, hypothyroidism admitted on 02/27/2019 complains of shortness of breath.  Found to have non-STEMI and acute systolic CHF.  Cardiology was consulted.    Subjective   Patient seen and examined, lethargic this morning.     Assessment/Plan:     1. Acute systolic CHF-EF 30 to [id number] on echocardiogram with hypokinesis of the left ventricle and apical area as well as dyskinesis of the left apical segment.  Patient on IV diuresis with Lasix.  Cardiology following.    2. Question community-acquired pneumonia-patient was empirically started on ceftriaxone and Zithromax at the time of admission for possible CAP.  At this time, does not appear patient has infectious process.  Will discontinue antibiotics at this time.  Solu-Medrol has been discontinued.  3. Acute kidney injury on CKD stage IV-patient baseline creatinine 1.5, creatinine is rising today is 3.85.  Palliative care was consulted and family wants to continue with medical management for now.  Patient is not a good candidate for hemodialysis.  Nephrology consulted.  Started on IV normal saline per nephrology.  Creatinine is improving, today creatinine is 3.54.  Follow BMP in a.m.  4. Hypertension-blood pressures controlled, continue home medications.  5. Type 2 diabetes mellitus-continue sliding scale insulin with NovoLog.  CBG well controlled.  6. Schizoaffective disorder-patient is on multiple psychotropic medications.  7. Hypothyroidism-continue Synthroid.    CBG: Recent Labs  Lab 03/31/19 1153 03/31/19 1716 03/31/19 2216 04/01/19 0808 04/01/19 1249  GLUCAP 176* 147* 182* 153* 251*    CBC: Recent Labs  Lab 03/02/2019 0835 03/28/19 0042 03/29/19 0456  03/30/19 0512 03/31/19 0832  WBC 14.3* 14.5* 15.9* 15.2* 13.7*  NEUTROABS 12.4*  --   --   --   --   HGB 9.9* 9.7* 8.6* 9.1* 9.4*  HCT 32.5* 29.8* 28.3* 29.1* 29.4*  MCV 101.9* 97.4 99.6 101.0* 96.1  PLT 199 212 198 213 0000000    Basic Metabolic Panel: Recent Labs  Lab 03/28/19 0042 03/29/19 0456 03/30/19 0512 03/31/19 0832 04/01/19 0443  NA 138 140 137 136 136  K 4.4 4.4 4.4 5.0 4.3  CL 107 108 105 105 106  CO2 18* 20* 17* 17* 17*  GLUCOSE 184* 207* 202* 205* 183*  BUN 45* 65* 81* 89* 96*  CREATININE 2.76* 3.36* 3.85* 3.84* 3.57*  CALCIUM 8.6* 8.2* 7.9* 7.9* 7.5*  MG  --  2.1  --   --   --      DVT prophylaxis: Heparin  Code Status: DNR.  Family Communication: Discussed with patient's daughter at bedside also discussed with patient's son on phone/face time on daughter's phone.  Discussed patient's stable yet worrisome condition, also discussed that patient is not a candidate for aggressive interventions including hemodialysis if her renal function continues to get worse.  Discussed in detail aggressive intervention and the futility of performing CPR, mechanical ventilation considering patient's underlying medical conditions and advanced age.  Patient at this time does not want to be resuscitated and also understands that she is not a candidate for hemodialysis.  Both patient and family members agree with DNR.  Disposition Plan: To be decided based on patient's clinical course in the hospital     BMI  Estimated body mass index is 22.67 kg/m as calculated from the following:   Height as  of this encounter: 5\' 3"  (1.6 m).   Weight as of this encounter: 58.1 kg.  Scheduled medications:  . aspirin EC  81 mg Oral Daily  . atorvastatin  40 mg Oral q1800  . carvedilol  25 mg Oral BID  . clopidogrel  75 mg Oral q1800  . fluticasone  1 spray Each Nare q1800  . heparin  5,000 Units Subcutaneous Q8H  . insulin aspart  0-5 Units Subcutaneous QHS  . insulin aspart  0-9 Units  Subcutaneous TID WC  . ipratropium-albuterol  3 mL Nebulization TID  . levothyroxine  50 mcg Oral Q0600  . loratadine  10 mg Oral Daily  . OLANZapine  15 mg Oral QHS  . sodium bicarbonate  650 mg Oral BID  . terazosin  5 mg Oral QHS    Consultants:  Cardiology  Procedures:     Antibiotics:   Anti-infectives (From admission, onward)   Start     Dose/Rate Route Frequency Ordered Stop   03/22/2019 0900  cefTRIAXone (ROCEPHIN) 2 g in sodium chloride 0.9 % 100 mL IVPB  Status:  Discontinued     2 g 200 mL/hr over 30 Minutes Intravenous Every 24 hours 03/23/2019 0859 03/30/19 0838   03/21/2019 0900  azithromycin (ZITHROMAX) 500 mg in sodium chloride 0.9 % 250 mL IVPB  Status:  Discontinued     500 mg 250 mL/hr over 60 Minutes Intravenous Every 24 hours 03/18/2019 0859 03/30/19 0838       Objective   Vitals:   03/31/19 2137 04/01/19 0433 04/01/19 0435 04/01/19 0803  BP: 127/70 136/67    Pulse: 76 68    Resp: 19 20    Temp: 97.8 F (36.6 C) 98 F (36.7 C)    TempSrc: Oral Oral    SpO2: 96% 97%  99%  Weight:   58.1 kg   Height:        Intake/Output Summary (Last 24 hours) at 04/01/2019 1354 Last data filed at 04/01/2019 1300 Gross per 24 hour  Intake 1693.5 ml  Output 1150 ml  Net 543.5 ml   Filed Weights   03/30/19 0240 03/31/19 0406 04/01/19 0435  Weight: 58 kg 57.1 kg 58.1 kg     Physical Examination:  General-appears in no acute distress Heart-S1-S2, regular, no murmur auscultated Lungs-clear to auscultation bilaterally, no wheezing or crackles auscultated Abdomen-soft, nontender, no organomegaly Extremities-no edema in the lower extremities Neuro-alert, oriented x3, no focal deficit noted   Data Reviewed: I have personally reviewed following labs and imaging studies   Recent Results (from the past 240 hour(s))  Blood Culture (routine x 2)     Status: None (Preliminary result)   Collection Time: 03/23/2019  9:00 AM   Specimen: BLOOD LEFT HAND  Result Value  Ref Range Status   Specimen Description   Final    BLOOD LEFT HAND Performed at [facility], [place] [address], [place], [place] [address]    Special Requests   Final    BOTTLES DRAWN AEROBIC AND ANAEROBIC Blood Culture adequate volume Performed at [place] [address] [place], [place] [address]    Culture   Final    NO GROWTH 4 DAYS Performed at [facility], [place] [address], [place],  [address]    Report Status PENDING  Incomplete  SARS Coronavirus 2 [facility] order, Performed in [facility] hospital lab) Nasopharyngeal Nasopharyngeal Swab     Status: None   Collection Time: 03/07/2019  9:00 AM  Specimen: Nasopharyngeal Swab  Result Value Ref Range Status   SARS Coronavirus 2 NEGATIVE NEGATIVE Final    Comment: (NOTE) If result is NEGATIVE SARS-CoV-2 target nucleic acids are NOT DETECTED. The SARS-CoV-2 RNA is generally detectable in upper and lower  respiratory specimens during the acute phase of infection. The lowest  concentration of SARS-CoV-2 viral copies this assay can detect is 250  copies / mL. A negative result does not preclude SARS-CoV-2 infection  and should not be used as the sole basis for treatment or other  patient management decisions.  A negative result may occur with  improper specimen collection / handling, submission of specimen other  than nasopharyngeal swab, presence of viral mutation(s) within the  areas targeted by this assay, and inadequate number of viral copies  (<250 copies / mL). A negative result must be combined with clinical  observations, patient history, and epidemiological information. If result is POSITIVE SARS-CoV-2 target nucleic acids are DETECTED. The SARS-CoV-2 RNA is generally detectable in upper and lower  respiratory specimens dur ing the acute phase of infection.  Positive  results are indicative of active infection with SARS-CoV-2.  Clinical  correlation with patient  history and other diagnostic information is  necessary to determine patient infection status.  Positive results do  not rule out bacterial infection or co-infection with other viruses. If result is PRESUMPTIVE POSTIVE SARS-CoV-2 nucleic acids MAY BE PRESENT.   A presumptive positive result was obtained on the submitted specimen  and confirmed on repeat testing.  While 2019 novel coronavirus  (SARS-CoV-2) nucleic acids may be present in the submitted sample  additional confirmatory testing may be necessary for epidemiological  and / or clinical management purposes  to differentiate between  SARS-CoV-2 and other Sarbecovirus currently known to infect humans.  If clinically indicated additional testing with an alternate test  methodology [phone]) is advised. The SARS-CoV-2 RNA is generally  detectable in upper and lower respiratory sp ecimens during the acute  phase of infection. The expected result is Negative. Fact Sheet for Patients:  StrictlyIdeas.no Fact Sheet for Healthcare Providers: BankingDealers.co.za This test is not yet approved or cleared by the [place] FDA and has been authorized for detection and/or diagnosis of SARS-CoV-2 by FDA under an Emergency Use Authorization (EUA).  This EUA will remain in effect (meaning this test can be used) for the duration of the COVID-19 declaration under Section 564(b)(1) of the Act, 21 U.S.C. section 360bbb-3(b)(1), unless the authorization is terminated or revoked sooner. Performed at [facility], [place] [address] [place], [place] [address]   Blood Culture (routine x 2)     Status: None (Preliminary result)   Collection Time: 03/23/2019  2:13 PM   Specimen: BLOOD  Result Value Ref Range Status   Specimen Description   Final    BLOOD LEFT ARM Performed at [place] [address] [place], [place] [address]    Special Requests   Final     BOTTLES DRAWN AEROBIC ONLY Blood Culture adequate volume Performed at [place] [address], [place], [place] [address]    Culture   Final    NO GROWTH 4 DAYS Performed at [facility], [place] [address] [place], [place] [address]    Report Status PENDING  Incomplete      BNP (last 3 results) Recent Labs    03/09/2019 0835  BNP 1,026.3*     Admission status: Inpatient: Based on patients clinical presentation and  evaluation of above clinical data, I have made determination that patient meets Inpatient criteria at this time.  Time spent: 20 minutes  [facility] Pager [phone]. If 7PM-7AM, please contact night-coverage at [email or web address], Office  [phone]  password [place]  04/01/2019, 1:54 PM  LOS: 5 days

## 2019-04-01 NOTE — Progress Notes (Signed)
[facility] Progress Note  Subjective: no c/o, creat down to 3.5 today, wt's stable around 58kg  Vitals:   03/31/19 2137 04/01/19 0433 04/01/19 0435 04/01/19 0803  BP: 127/70 136/67    Pulse: 76 68    Resp: 19 20    Temp: 97.8 F (36.6 C) 98 F (36.7 C)    TempSrc: Oral Oral    SpO2: 96% 97%  99%  Weight:   58.1 kg   Height:        Inpatient medications: . aspirin EC  81 mg Oral Daily  . atorvastatin  40 mg Oral q1800  . carvedilol  25 mg Oral BID  . clopidogrel  75 mg Oral q1800  . fluticasone  1 spray Each Nare q1800  . heparin  5,000 Units Subcutaneous Q8H  . insulin aspart  0-5 Units Subcutaneous QHS  . insulin aspart  0-9 Units Subcutaneous TID WC  . ipratropium-albuterol  3 mL Nebulization TID  . levothyroxine  50 mcg Oral Q0600  . lip balm      . loratadine  10 mg Oral Daily  . OLANZapine  15 mg Oral QHS  . sodium bicarbonate  650 mg Oral BID  . terazosin  5 mg Oral QHS   . sodium chloride 65 mL/hr at 03/31/19 2244   acetaminophen, HYDROcodone-acetaminophen    Exam: Gen lethargic elderly WF but Ox 3, slurred speech No rash, cyanosis or gangrene Sclera anicteric, throat clear  No jvd or bruits Chest some scattered basilar crackles bilat, no wheezing RRR no MRG Abd soft ntnd no mass or ascites +bs GU foley draining clear urine in good amts MS no joint effusions or deformity Ext no sig pitting edema, no wounds or ulcers Neuro is somnolent , arouses easily, gen'd very weak in bed  Date                Creat               eGFR  2018               1.40- 1.85        21- 32, stage IIIb-IV  03/12/2019           1.59  03/28/19             2.58  03/29/19             3.36  03/30/19             3.85   Home meds:  - carvedilol 25 bid/ felodipine 5 / hydralazine 25 tid / lisinopril 20 bid/ terazosin 5 hs  - olanzapine 4m hs  - clopidogrel 75   - levothyroxine 50 am  - glipizide 5 qd  - prn's/ vitamins/ supplements   UA 8/31 > negative except 100  prot and rare bact  UNa 94 , UCr 63   Renal UKoreashowed 8.7 and 7.4 cm kidneys w/o hydro , and +^'d echo CXR on 8/31 shows question RLL infiltrate, no edeam   Assessment/ Plan: # AKI on CKD 3/4 - underlying renal fxn is poor w/ small kidneys on UKoreaconsistent w/ advanced renal failure.  No nephrotoxins rec'd, no contrast or ACEi (lisinopril held on admission). Creat bump due to diuresis. Agree poor HD candidate. Diuretics dc'd, giving back cautiously IVF's. Creat down 3.8 > 3.5 today, improving. Baseline creat 1.6-2.0. No signs of vol overload on exam.  - dc IVF's - avoid ACEi indefinite - check labs  in am  # HTN - BP's well controlled here w/ coreg and Hytrin - could add felodipine if needed - avoid ACEi/ ARB indefinitely  # RLL asp PNA - on IV abx  # DM2 per primary     [facility] 04/01/2019, 11:10 AM  Iron/TIBC/Ferritin/ %Sat    Component Value Date/Time   IRON 19 (L) 03/23/2019 1413   TIBC 246 (L) 03/08/2019 1413   FERRITIN 244 03/05/2019 1413   IRONPCTSAT 8 (L) 02/27/2019 1413   Recent Labs  Lab 04/01/19 0443  NA 136  K 4.3  CL 106  CO2 17*  GLUCOSE 183*  BUN 96*  CREATININE 3.57*  CALCIUM 7.5*   No results for input(s): AST, ALT, ALKPHOS, BILITOT, PROT in the last 168 hours. Recent Labs  Lab 03/31/19 0832  WBC 13.7*  HGB 9.4*  HCT 29.4*  PLT 188

## 2019-04-01 NOTE — Progress Notes (Signed)
Progress Note  Patient Name: Bedelia Salvucci Date of Encounter: 04/01/2019  Primary Cardiologist: New   Subjective   Diuretics held, receiving gentle IV fluids.  Net positive 800cc yesterday, with improvement in renal function (3.84 ->3.57).  She is alert and sitting in chair this morning.  Inpatient Medications    Scheduled Meds: . aspirin EC  81 mg Oral Daily  . atorvastatin  40 mg Oral q1800  . carvedilol  25 mg Oral BID  . clopidogrel  75 mg Oral q1800  . fluticasone  1 spray Each Nare q1800  . heparin  5,000 Units Subcutaneous Q8H  . insulin aspart  0-5 Units Subcutaneous QHS  . insulin aspart  0-9 Units Subcutaneous TID WC  . ipratropium-albuterol  3 mL Nebulization TID  . levothyroxine  50 mcg Oral Q0600  . loratadine  10 mg Oral Daily  . OLANZapine  15 mg Oral QHS  . sodium bicarbonate  650 mg Oral BID  . terazosin  5 mg Oral QHS   Continuous Infusions: . sodium chloride 65 mL/hr at 03/31/19 2244   PRN Meds: acetaminophen, HYDROcodone-acetaminophen   Vital Signs    Vitals:   03/31/19 1956 03/31/19 2137 04/01/19 0433 04/01/19 0435  BP:  127/70 136/67   Pulse:  76 68   Resp:  19 20   Temp:  97.8 F (36.6 C) 98 F (36.7 C)   TempSrc:  Oral Oral   SpO2: 98% 96% 97%   Weight:    58.1 kg  Height:        Intake/Output Summary (Last 24 hours) at 04/01/2019 0728 Last data filed at 04/01/2019 0647 Gross per 24 hour  Intake 1953.97 ml  Output 1150 ml  Net 803.97 ml   Filed Weights   03/30/19 0240 03/31/19 0406 04/01/19 0435  Weight: 58 kg 57.1 kg 58.1 kg    Physical Exam   General: Frail, elderly, NAD Lungs:Clear to ausculation bilaterally. No wheezes, rales, or rhonchi. Breathing is unlabored. Cardiovascular: RRR with S1 S2. No murmur Abdomen: Soft, non-tender, non-distended. Extremities: No edema.  Neuro: Aler. No focal deficits. No facial asymmetry.  Psych: Responds to questions somewhat appropriately with normal affect.    Labs     Chemistry Recent Labs  Lab 03/30/19 0512 03/31/19 0832 04/01/19 0443  NA 137 136 136  K 4.4 5.0 4.3  CL 105 105 106  CO2 17* 17* 17*  GLUCOSE 202* 205* 183*  BUN 81* 89* 96*  CREATININE 3.85* 3.84* 3.57*  CALCIUM 7.9* 7.9* 7.5*  GFRNONAA 10* 10* 11*  GFRAA 12* 12* 13*  ANIONGAP 15 14 13      Hematology Recent Labs  Lab 03/29/19 0456 03/30/19 0512 03/31/19 0832  WBC 15.9* 15.2* 13.7*  RBC 2.84* 2.88* 3.06*  HGB 8.6* 9.1* 9.4*  HCT 28.3* 29.1* 29.4*  MCV 99.6 101.0* 96.1  MCH 30.3 31.6 30.7  MCHC 30.4 31.3 32.0  RDW 13.8 13.5 13.2  PLT 198 213 188    Cardiac EnzymesNo results for input(s): TROPONINI in the last 168 hours. No results for input(s): TROPIPOC in the last 168 hours.   BNP Recent Labs  Lab 03/11/2019 0835  BNP 1,026.3*     DDimer No results for input(s): DDIMER in the last 168 hours.   Radiology    No results found.  Telemetry    Sinus rhythm with rate 60s-80s, PVCs - Personally Reviewed  ECG    No new tracing - Personally Reviewed  Cardiac Studies  Echo 03/28/2019:  1. Mild dyskinesis of the left ventricular, entire apical segment. 2. Severe hypokinesis of the left ventricular, mid-apical anteroseptal wall and anterior wall. 3. The left ventricle has moderate-severely reduced systolic function, with an ejection fraction of 30-35%. The cavity size was normal. Left ventricular diastolic Doppler parameters are consistent with pseudonormalization. Elevated mean left atrial  pressure. 4. The right ventricle has normal systolic function. The cavity was normal. There is no increase in right ventricular wall thickness. Right ventricular systolic pressure is mildly elevated. 5. Left atrial size was mildly dilated. 6. Trivial pericardial effusion is present. 7. The mitral valve is grossly normal. Mild thickening of the mitral valve leaflet. Mitral valve regurgitation is moderate by color flow Doppler. The MR jet is centrally-directed. 8.  The aortic valve is tricuspid. Mild thickening of the aortic valve. Aortic valve regurgitation is trivial by color flow Doppler. 9. The aorta is normal unless otherwise noted. 10. The inferior vena cava was dilated in size with <50% respiratory variability. 11. No intracardiac thrombi or masses were visualized with Definity contrast imaging.  Echo 05/2013 Study Conclusions   - Left ventricle: The cavity size was normal. Wall thickness  was increased in a pattern of mild LVH. There was mild  focal basal hypertrophy of the septum. Systolic function  was normal. The estimated ejection fraction was in the  range of 55% to 65%. Wall motion was normal; there were no  regional wall motion abnormalities. Doppler parameters are  consistent with abnormal left ventricular relaxation  (grade 1 diastolic dysfunction).  - Aortic valve: Trivial regurgitation.  - Mitral valve: Calcified annulus. Mild regurgitation.  - Pericardium, extracardiac: A trivial pericardial effusion  was identified.  Patient Profile     84 y.o. female with a hx of DM, HTN, HLD, CKD-4, schizoaffective disorder, hypothyroidism, and strokewho is being seen today for the evaluation of elevated troponin after admit for hypoxia, CHF with BNP of 1026at the request of Dr. Posey Pronto.  Assessment & Plan    NSTEMI: likely late presentation of MI, initial HsTn 3824 which was downtrending.  TTE suggestive of an anterior infarct, LV EF 30-35%.  She denies anginal symptoms, though has been disoriented.  Not a candidate for cath in setting of AKI and frailty.  Medical management  - Continue ASA, plavix, statin - Completed 48 hours on heparin gtt, have discontineud  Presumed ischemic cardiomyopathy: LVEF of 25-30% -Worsening renal function with diuresis, have held lasix and giving IV fluids per nephrology  -No ACE/ARB in the setting of AKI -Continue carvedilol 25mg  BID  Acute on chronic kidney disease Stage IV: Cr up  to 3.85 (baseline ~1.5), improving with IV fluids, 3.57 today -IVF hydration per nephrology   HTN: BP stable, would continue carvedilol 25 mg BID   Donato Heinz, MD   For questions or updates, please contact   Please consult [email or web address] for contact info under Cardiology/STEMI.

## 2019-04-02 LAB — GLUCOSE, CAPILLARY
Glucose-Capillary: 128 mg/dL — ABNORMAL HIGH (ref 70–99)
Glucose-Capillary: 136 mg/dL — ABNORMAL HIGH (ref 70–99)
Glucose-Capillary: 147 mg/dL — ABNORMAL HIGH (ref 70–99)
Glucose-Capillary: 194 mg/dL — ABNORMAL HIGH (ref 70–99)

## 2019-04-02 LAB — BASIC METABOLIC PANEL
Anion gap: 14 (ref 5–15)
BUN: 87 mg/dL — ABNORMAL HIGH (ref 8–23)
CO2: 16 mmol/L — ABNORMAL LOW (ref 22–32)
Calcium: 7.7 mg/dL — ABNORMAL LOW (ref 8.9–10.3)
Chloride: 104 mmol/L (ref 98–111)
Creatinine, Ser: 3.34 mg/dL — ABNORMAL HIGH (ref 0.44–1.00)
GFR calc Af Amer: 14 mL/min — ABNORMAL LOW (ref >60)
GFR calc non Af Amer: 12 mL/min — ABNORMAL LOW (ref >60)
Glucose, Bld: 130 mg/dL — ABNORMAL HIGH (ref 70–99)
Potassium: 4.5 mmol/L (ref 3.5–5.1)
Sodium: 134 mmol/L — ABNORMAL LOW (ref 135–145)

## 2019-04-02 NOTE — Progress Notes (Signed)
Progress Note  Patient Name: Ashley Velez Date of Encounter: 04/02/2019  Primary Cardiologist: New   Subjective   IV fluids discontinued, renal function improved today (3.57 ->3.34).  Somnolent but arousable this morning.  Denies any shortness of breath.  Inpatient Medications    Scheduled Meds: . aspirin EC  81 mg Oral Daily  . atorvastatin  40 mg Oral q1800  . carvedilol  25 mg Oral BID  . clopidogrel  75 mg Oral q1800  . fluticasone  1 spray Each Nare q1800  . heparin  5,000 Units Subcutaneous Q8H  . insulin aspart  0-5 Units Subcutaneous QHS  . insulin aspart  0-9 Units Subcutaneous TID WC  . ipratropium-albuterol  3 mL Nebulization TID  . levothyroxine  50 mcg Oral Q0600  . loratadine  10 mg Oral Daily  . OLANZapine  15 mg Oral QHS  . sodium bicarbonate  650 mg Oral BID  . terazosin  5 mg Oral QHS   Continuous Infusions:  PRN Meds: acetaminophen, HYDROcodone-acetaminophen   Vital Signs    Vitals:   04/01/19 0803 04/01/19 1405 04/01/19 2018 04/02/19 0359  BP:   126/74 130/66  Pulse:   73 67  Resp:   18 19  Temp:   98.8 F (37.1 C) 98 F (36.7 C)  TempSrc:   Oral   SpO2: 99% 100% 98% 93%  Weight:      Height:        Intake/Output Summary (Last 24 hours) at 04/02/2019 0729 Last data filed at 04/02/2019 0503 Gross per 24 hour  Intake 600 ml  Output 1975 ml  Net -1375 ml   Filed Weights   03/30/19 0240 03/31/19 0406 04/01/19 0435  Weight: 58 kg 57.1 kg 58.1 kg    Physical Exam   General: Frail, elderly, NAD Lungs:Clear to ausculation bilaterally. No wheezes, rales, or rhonchi. Breathing is unlabored. Cardiovascular: RRR with S1 S2. 2/6 systolic murmur Abdomen: Soft, non-tender, non-distended. Extremities: No edema.  Neuro: Somnolent but arousable. No focal deficits. No facial asymmetry.  Psych: Responds to questions somewhat appropriately with normal affect.    Labs    Chemistry Recent Labs  Lab 03/30/19 0512 03/31/19 0832 04/01/19  0443  NA 137 136 136  K 4.4 5.0 4.3  CL 105 105 106  CO2 17* 17* 17*  GLUCOSE 202* 205* 183*  BUN 81* 89* 96*  CREATININE 3.85* 3.84* 3.57*  CALCIUM 7.9* 7.9* 7.5*  GFRNONAA 10* 10* 11*  GFRAA 12* 12* 13*  ANIONGAP 15 14 13      Hematology Recent Labs  Lab 03/29/19 0456 03/30/19 0512 03/31/19 0832  WBC 15.9* 15.2* 13.7*  RBC 2.84* 2.88* 3.06*  HGB 8.6* 9.1* 9.4*  HCT 28.3* 29.1* 29.4*  MCV 99.6 101.0* 96.1  MCH 30.3 31.6 30.7  MCHC 30.4 31.3 32.0  RDW 13.8 13.5 13.2  PLT 198 213 188    Cardiac EnzymesNo results for input(s): TROPONINI in the last 168 hours. No results for input(s): TROPIPOC in the last 168 hours.   BNP Recent Labs  Lab 03/12/2019 0835  BNP 1,026.3*     DDimer No results for input(s): DDIMER in the last 168 hours.   Radiology    No results found.  Telemetry    Sinus rhythm with rate 60s-70s - Personally Reviewed  ECG    No new tracing - Personally Reviewed  Cardiac Studies   Echo 03/28/2019:  1. Mild dyskinesis of the left ventricular, entire apical segment. 2. Severe  hypokinesis of the left ventricular, mid-apical anteroseptal wall and anterior wall. 3. The left ventricle has moderate-severely reduced systolic function, with an ejection fraction of 30-35%. The cavity size was normal. Left ventricular diastolic Doppler parameters are consistent with pseudonormalization. Elevated mean left atrial  pressure. 4. The right ventricle has normal systolic function. The cavity was normal. There is no increase in right ventricular wall thickness. Right ventricular systolic pressure is mildly elevated. 5. Left atrial size was mildly dilated. 6. Trivial pericardial effusion is present. 7. The mitral valve is grossly normal. Mild thickening of the mitral valve leaflet. Mitral valve regurgitation is moderate by color flow Doppler. The MR jet is centrally-directed. 8. The aortic valve is tricuspid. Mild thickening of the aortic valve. Aortic  valve regurgitation is trivial by color flow Doppler. 9. The aorta is normal unless otherwise noted. 10. The inferior vena cava was dilated in size with <50% respiratory variability. 11. No intracardiac thrombi or masses were visualized with Definity contrast imaging.  Echo 05/2013 Study Conclusions   - Left ventricle: The cavity size was normal. Wall thickness  was increased in a pattern of mild LVH. There was mild  focal basal hypertrophy of the septum. Systolic function  was normal. The estimated ejection fraction was in the  range of 55% to 65%. Wall motion was normal; there were no  regional wall motion abnormalities. Doppler parameters are  consistent with abnormal left ventricular relaxation  (grade 1 diastolic dysfunction).  - Aortic valve: Trivial regurgitation.  - Mitral valve: Calcified annulus. Mild regurgitation.  - Pericardium, extracardiac: A trivial pericardial effusion  was identified.  Patient Profile     84 y.o. female with a hx of DM, HTN, HLD, CKD-4, schizoaffective disorder, hypothyroidism, and strokewho is being seen today for the evaluation of elevated troponin after admit for hypoxia, CHF with BNP of 1026at the request of Dr. Posey Pronto.  Assessment & Plan    NSTEMI: likely late presentation of MI, initial HsTn 3824 which was downtrending.  TTE suggestive of an anterior infarct, LV EF 30-35%.  She denies anginal symptoms, though has been disoriented.  Not a candidate for cath in setting of AKI on CKD and frailty.  Medical management  - Continue ASA, plavix, statin - Completed 48 hours on heparin gtt, have discontineud  Presumed ischemic cardiomyopathy: LVEF of 25-30% -Worsening renal function with diuresis, holding lasix -No ACE/ARB given renal function -Continue carvedilol 25mg  BID  Acute on chronic kidney disease Stage IV: Cr up to 3.85 (baseline ~1.5-2.0), improved with IV fluids, 3.34 today -IVF hydration per nephrology: currently  holding IV fluids  HTN: BP stable, would continue carvedilol 25 mg BID   Donato Heinz, MD   For questions or updates, please contact   Please consult [email or web address] for contact info under Cardiology/STEMI.

## 2019-04-02 NOTE — Progress Notes (Signed)
[facility] Progress Note  Subjective: creat down to 3.2, no c/o , lethargic  Vitals:   04/01/19 1405 04/01/19 2018 04/02/19 0359 04/02/19 0805  BP:  126/74 130/66   Pulse:  73 67   Resp:  18 19   Temp:  98.8 F (37.1 C) 98 F (36.7 C)   TempSrc:  Oral    SpO2: 100% 98% 93% 97%  Weight:      Height:        Inpatient medications: . aspirin EC  81 mg Oral Daily  . atorvastatin  40 mg Oral q1800  . carvedilol  25 mg Oral BID  . clopidogrel  75 mg Oral q1800  . fluticasone  1 spray Each Nare q1800  . heparin  5,000 Units Subcutaneous Q8H  . insulin aspart  0-5 Units Subcutaneous QHS  . insulin aspart  0-9 Units Subcutaneous TID WC  . ipratropium-albuterol  3 mL Nebulization TID  . levothyroxine  50 mcg Oral Q0600  . loratadine  10 mg Oral Daily  . OLANZapine  15 mg Oral QHS  . sodium bicarbonate  650 mg Oral BID  . terazosin  5 mg Oral QHS    acetaminophen, HYDROcodone-acetaminophen    Exam: Gen lethargic elderly WF but Ox 3, slurred speech No rash, cyanosis or gangrene Sclera anicteric, throat clear  No jvd or bruits Chest some scattered basilar crackles bilat, no wheezing RRR no MRG Abd soft ntnd no mass or ascites +bs GU foley draining clear urine in good amts MS no joint effusions or deformity Ext no sig pitting edema, no wounds or ulcers Neuro is somnolent , arouses easily, gen'd very weak in bed  Date                Creat               eGFR  2018               1.40- 1.85        21- 32, stage IIIb-IV  03/18/2019           1.59  03/28/19             2.58  03/29/19             3.36  03/30/19             3.85   Home meds:  - carvedilol 25 bid/ felodipine 5 / hydralazine 25 tid / lisinopril 20 bid/ terazosin 5 hs  - olanzapine 24m hs  - clopidogrel 75   - levothyroxine 50 am  - glipizide 5 qd  - prn's/ vitamins/ supplements   UA 8/31 > negative except 100 prot and rare bact  UNa 94 , UCr 63   Renal UKoreashowed 8.7 and 7.4 cm kidneys w/o hydro  , and +^'d echo CXR on 8/31 shows question RLL infiltrate, no edeam   Assessment/ Plan: 1. AKI on CKD 3/4 - underlying renal fxn is poor w/ small kidneys on UKoreaconsistent w/ advanced renal failure.  No nephrotoxins rec'd, no contrast or ACEi (lisinopril held on admission). Creat bump due to diuresis. Improving now , creat down to 3.2.  Agree poor HD candidate. No signs of vol overload on exam. Will sign off.  - avoid ACEi indefinite  2. HTN - BP's well controlled here w/ coreg and Hytrin - could add back felodipine if needed - avoid ACEi/ ARB indefinite  3. RLL asp PNA - on IV  abx  4. DM2 per primary     [facility] 04/02/2019, 9:26 AM  Iron/TIBC/Ferritin/ %Sat    Component Value Date/Time   IRON 19 (L) 02/25/2019 1413   TIBC 246 (L) 03/12/2019 1413   FERRITIN 244 03/26/2019 1413   IRONPCTSAT 8 (L) 03/18/2019 1413   Recent Labs  Lab 04/02/19 0549  NA 134*  K 4.5  CL 104  CO2 16*  GLUCOSE 130*  BUN 87*  CREATININE 3.34*  CALCIUM 7.7*   No results for input(s): AST, ALT, ALKPHOS, BILITOT, PROT in the last 168 hours. Recent Labs  Lab 03/31/19 0832  WBC 13.7*  HGB 9.4*  HCT 29.4*  PLT 188

## 2019-04-02 NOTE — Progress Notes (Signed)
[facility]  PROGRESS NOTE  Ashley Velez [id number] DOB: May 30, 1935 DOA: 03/07/2019 PCP: Crist Infante, MD   Brief HPI:   84 year old female with history of diabetes mellitus type 2, hypertension, hyperlipidemia, stage IV CKD, schizoaffective disorder, hypothyroidism admitted on 03/04/2019 complains of shortness of breath.  Found to have non-STEMI and acute systolic CHF.  Cardiology was consulted.    Subjective   Patient seen and examined, lethargic this morning, but arousable.  Renal function is slowly improving.   Assessment/Plan:     1. Acute systolic CHF-EF 30 to [id number] on echocardiogram with hypokinesis of the left ventricle and apical area as well as dyskinesis of the left apical segment.  Lasix is currently on hold due to worsening renal function.  2. Non-STEMI-patient had significantly elevated high-sensitivity troponin 3824, TTE suggested anterior infarct. Patient noted cardiac cath candidate considering multiple medical issues.  Cardiology following.  Medical management recommended.  3. ?community-acquired pneumonia-patient was empirically started on ceftriaxone and Zithromax at the time of admission for possible CAP.  At this time, does not appear patient has infectious process.  Antibiotics were discontinued.  Solu-Medrol has been discontinued.  4. Acute kidney injury on CKD stage IV-patient baseline creatinine 1.5, creatinine is rising today is 3.85.  Palliative care was consulted and family wants to continue with medical management for now.  Patient is not a good candidate for hemodialysis.  Nephrology consulted.  Started on IV normal saline per nephrology.  Creatinine is improving, today creatinine is 3.34.  Follow BMP in a.m. Nephrology has signed off.  5. Hypertension-blood pressures controlled, continue home medications.  6. Type 2 diabetes mellitus-continue sliding scale insulin with NovoLog.  CBG well controlled.  7. Schizoaffective disorder-patient is on  multiple psychotropic medications.  8. Hypothyroidism-continue Synthroid.    CBG: Recent Labs  Lab 04/01/19 1249 04/01/19 1659 04/01/19 2218 04/02/19 0722 04/02/19 1216  GLUCAP 251* 209* 191* 128* 136*    CBC: Recent Labs  Lab 03/18/2019 0835 03/28/19 0042 03/29/19 0456 03/30/19 0512 03/31/19 0832  WBC 14.3* 14.5* 15.9* 15.2* 13.7*  NEUTROABS 12.4*  --   --   --   --   HGB 9.9* 9.7* 8.6* 9.1* 9.4*  HCT 32.5* 29.8* 28.3* 29.1* 29.4*  MCV 101.9* 97.4 99.6 101.0* 96.1  PLT 199 212 198 213 0000000    Basic Metabolic Panel: Recent Labs  Lab 03/29/19 0456 03/30/19 0512 03/31/19 0832 04/01/19 0443 04/02/19 0549  NA 140 137 136 136 134*  K 4.4 4.4 5.0 4.3 4.5  CL 108 105 105 106 104  CO2 20* 17* 17* 17* 16*  GLUCOSE 207* 202* 205* 183* 130*  BUN 65* 81* 89* 96* 87*  CREATININE 3.36* 3.85* 3.84* 3.57* 3.34*  CALCIUM 8.2* 7.9* 7.9* 7.5* 7.7*  MG 2.1  --   --   --   --      DVT prophylaxis: Heparin  Code Status: DNR.  Family Communication: Discussed with patient's daughter at bedside also discussed with patient's son on phone/face time on daughter's phone.  Discussed patient's stable yet worrisome condition, also discussed that patient is not a candidate for aggressive interventions including hemodialysis if her renal function continues to get worse.  Discussed in detail aggressive intervention and the futility of performing CPR, mechanical ventilation considering patient's underlying medical conditions and advanced age.  Patient at this time does not want to be resuscitated and also understands that she is not a candidate for hemodialysis.  Both patient and family members agree with DNR.  Disposition  Plan: To be decided based on patient's clinical course in the hospital     BMI  Estimated body mass index is 22.67 kg/m as calculated from the following:   Height as of this encounter: 5\' 3"  (1.6 m).   Weight as of this encounter: 58.1 kg.  Scheduled  medications:  . aspirin EC  81 mg Oral Daily  . atorvastatin  40 mg Oral q1800  . carvedilol  25 mg Oral BID  . clopidogrel  75 mg Oral q1800  . fluticasone  1 spray Each Nare q1800  . heparin  5,000 Units Subcutaneous Q8H  . insulin aspart  0-5 Units Subcutaneous QHS  . insulin aspart  0-9 Units Subcutaneous TID WC  . ipratropium-albuterol  3 mL Nebulization TID  . levothyroxine  50 mcg Oral Q0600  . loratadine  10 mg Oral Daily  . OLANZapine  15 mg Oral QHS  . sodium bicarbonate  650 mg Oral BID  . terazosin  5 mg Oral QHS    Consultants:  Cardiology  Procedures:     Antibiotics:   Anti-infectives (From admission, onward)   Start     Dose/Rate Route Frequency Ordered Stop   03/02/2019 0900  cefTRIAXone (ROCEPHIN) 2 g in sodium chloride 0.9 % 100 mL IVPB  Status:  Discontinued     2 g 200 mL/hr over 30 Minutes Intravenous Every 24 hours 03/23/2019 0859 03/30/19 0838   03/01/2019 0900  azithromycin (ZITHROMAX) 500 mg in sodium chloride 0.9 % 250 mL IVPB  Status:  Discontinued     500 mg 250 mL/hr over 60 Minutes Intravenous Every 24 hours 03/12/2019 0859 03/30/19 0838       Objective   Vitals:   04/02/19 0805 04/02/19 1008 04/02/19 1328 04/02/19 1342  BP:  139/80 140/72   Pulse:  74 79   Resp:   20   Temp:   98.4 F (36.9 C)   TempSrc:   Oral   SpO2: 97%  97% 96%  Weight:      Height:        Intake/Output Summary (Last 24 hours) at 04/02/2019 1453 Last data filed at 04/02/2019 1327 Gross per 24 hour  Intake 360 ml  Output 2500 ml  Net -2140 ml   Filed Weights   03/30/19 0240 03/31/19 0406 04/01/19 0435  Weight: 58 kg 57.1 kg 58.1 kg     Physical Examination:  General-appears in no acute distress Heart-S1-S2, regular, no murmur auscultated Lungs-clear to auscultation bilaterally, no wheezing or crackles auscultated Abdomen-soft, nontender, no organomegaly Extremities-no edema in the lower extremities Neuro-somnolent but arousable Data Reviewed: I have  personally reviewed following labs and imaging studies   Recent Results (from the past 240 hour(s))  Blood Culture (routine x 2)     Status: None   Collection Time: 03/08/2019  9:00 AM   Specimen: BLOOD LEFT HAND  Result Value Ref Range Status   Specimen Description   Final    BLOOD LEFT HAND Performed at [facility], [place] [address], [place], [place] [address]    Special Requests   Final    BOTTLES DRAWN AEROBIC AND ANAEROBIC Blood Culture adequate volume Performed at [place] [address], [place], [place] [address]    Culture   Final    NO GROWTH 5 DAYS Performed at [facility], [place] [address] [place], [place] [address]    Report Status 04/01/2019 FINAL  Final  SARS Coronavirus 2 [facility] order,  Performed in [facility] hospital lab) Nasopharyngeal Nasopharyngeal Swab     Status: None   Collection Time: 03/23/2019  9:00 AM   Specimen: Nasopharyngeal Swab  Result Value Ref Range Status   SARS Coronavirus 2 NEGATIVE NEGATIVE Final    Comment: (NOTE) If result is NEGATIVE SARS-CoV-2 target nucleic acids are NOT DETECTED. The SARS-CoV-2 RNA is generally detectable in upper and lower  respiratory specimens during the acute phase of infection. The lowest  concentration of SARS-CoV-2 viral copies this assay can detect is 250  copies / mL. A negative result does not preclude SARS-CoV-2 infection  and should not be used as the sole basis for treatment or other  patient management decisions.  A negative result may occur with  improper specimen collection / handling, submission of specimen other  than nasopharyngeal swab, presence of viral mutation(s) within the  areas targeted by this assay, and inadequate number of viral copies  (<250 copies / mL). A negative result must be combined with clinical  observations, patient history, and epidemiological information. If result is POSITIVE SARS-CoV-2 target nucleic acids are DETECTED. The  SARS-CoV-2 RNA is generally detectable in upper and lower  respiratory specimens dur ing the acute phase of infection.  Positive  results are indicative of active infection with SARS-CoV-2.  Clinical  correlation with patient history and other diagnostic information is  necessary to determine patient infection status.  Positive results do  not rule out bacterial infection or co-infection with other viruses. If result is PRESUMPTIVE POSTIVE SARS-CoV-2 nucleic acids MAY BE PRESENT.   A presumptive positive result was obtained on the submitted specimen  and confirmed on repeat testing.  While 2019 novel coronavirus  (SARS-CoV-2) nucleic acids may be present in the submitted sample  additional confirmatory testing may be necessary for epidemiological  and / or clinical management purposes  to differentiate between  SARS-CoV-2 and other Sarbecovirus currently known to infect humans.  If clinically indicated additional testing with an alternate test  methodology [phone]) is advised. The SARS-CoV-2 RNA is generally  detectable in upper and lower respiratory sp ecimens during the acute  phase of infection. The expected result is Negative. Fact Sheet for Patients:  StrictlyIdeas.no Fact Sheet for Healthcare Providers: BankingDealers.co.za This test is not yet approved or cleared by the [place] FDA and has been authorized for detection and/or diagnosis of SARS-CoV-2 by FDA under an Emergency Use Authorization (EUA).  This EUA will remain in effect (meaning this test can be used) for the duration of the COVID-19 declaration under Section 564(b)(1) of the Act, 21 U.S.C. section 360bbb-3(b)(1), unless the authorization is terminated or revoked sooner. Performed at [facility], [place] [address], [place], [place] [address]   Blood Culture (routine x 2)     Status: None   Collection Time: 03/07/2019  2:13 PM   Specimen: BLOOD   Result Value Ref Range Status   Specimen Description   Final    BLOOD LEFT ARM Performed at [place] [address], [place], [place] [address]    Special Requests   Final    BOTTLES DRAWN AEROBIC ONLY Blood Culture adequate volume Performed at [place] [address] [place], [place] [address]    Culture   Final    NO GROWTH 5 DAYS Performed at [facility], [place] [address] [place], [place] [address]    Report Status 04/01/2019 FINAL  Final      BNP (last 3  results) Recent Labs    03/26/2019 0835  BNP 1,026.3*     Admission status: Inpatient: Based on patients clinical presentation and evaluation of above clinical data, I have made determination that patient meets Inpatient criteria at this time.  Time spent: 20 minutes  [facility] Pager [phone]. If 7PM-7AM, please contact night-coverage at [email or web address], Office  [phone]  password [email or web address]  04/02/2019, 2:53 PM  LOS: 6 days

## 2019-04-03 ENCOUNTER — Inpatient Hospital Stay (HOSPITAL_COMMUNITY): Payer: Medicare Other

## 2019-04-03 DIAGNOSIS — R627 Adult failure to thrive: Secondary | ICD-10-CM

## 2019-04-03 LAB — BASIC METABOLIC PANEL
Anion gap: 10 (ref 5–15)
BUN: 79 mg/dL — ABNORMAL HIGH (ref 8–23)
CO2: 18 mmol/L — ABNORMAL LOW (ref 22–32)
Calcium: 8 mg/dL — ABNORMAL LOW (ref 8.9–10.3)
Chloride: 109 mmol/L (ref 98–111)
Creatinine, Ser: 3.17 mg/dL — ABNORMAL HIGH (ref 0.44–1.00)
GFR calc Af Amer: 15 mL/min — ABNORMAL LOW (ref >60)
GFR calc non Af Amer: 13 mL/min — ABNORMAL LOW (ref >60)
Glucose, Bld: 204 mg/dL — ABNORMAL HIGH (ref 70–99)
Potassium: 4.6 mmol/L (ref 3.5–5.1)
Sodium: 137 mmol/L (ref 135–145)

## 2019-04-03 LAB — GLUCOSE, CAPILLARY
Glucose-Capillary: 128 mg/dL — ABNORMAL HIGH (ref 70–99)
Glucose-Capillary: 183 mg/dL — ABNORMAL HIGH (ref 70–99)
Glucose-Capillary: 114 mg/dL — ABNORMAL HIGH (ref 70–99)
Glucose-Capillary: 155 mg/dL — ABNORMAL HIGH (ref 70–99)

## 2019-04-03 LAB — URINALYSIS, ROUTINE W REFLEX MICROSCOPIC
Bacteria, UA: NONE SEEN
Bilirubin Urine: NEGATIVE
Glucose, UA: NEGATIVE mg/dL
Hgb urine dipstick: NEGATIVE
Ketones, ur: NEGATIVE mg/dL
Nitrite: NEGATIVE
Protein, ur: NEGATIVE mg/dL
Specific Gravity, Urine: 1.01 (ref 1.005–1.030)
pH: 5 (ref 5.0–8.0)

## 2019-04-03 LAB — CBC
HCT: 33 % — ABNORMAL LOW (ref 36.0–46.0)
Hemoglobin: 10.9 g/dL — ABNORMAL LOW (ref 12.0–15.0)
MCH: 30.7 pg (ref 26.0–34.0)
MCHC: 33 g/dL (ref 30.0–36.0)
MCV: 93 fL (ref 80.0–100.0)
Platelets: 245 10*3/uL (ref 150–400)
RBC: 3.55 MIL/uL — ABNORMAL LOW (ref 3.87–5.11)
RDW: 13.2 % (ref 11.5–15.5)
WBC: 20.6 10*3/uL — ABNORMAL HIGH (ref 4.0–10.5)
nRBC: 0 % (ref 0.0–0.2)

## 2019-04-03 MED ORDER — HYDRALAZINE HCL 25 MG PO TABS
25.0000 mg | ORAL_TABLET | Freq: Three times a day (TID) | ORAL | Status: DC
  Administered 2019-04-03 – 2019-04-11 (×27): 25 mg via ORAL
  Filled 2019-04-03 (×27): qty 1

## 2019-04-03 NOTE — Progress Notes (Signed)
[facility]  PROGRESS NOTE  Greg Cappa [id number] DOB: [birth year] DOA: 03/13/2019 PCP: Crist Infante, MD   Brief HPI:   84 year old female with history of diabetes mellitus type 2, hypertension, hyperlipidemia, stage IV CKD, schizoaffective disorder, hypothyroidism admitted on 02/25/2019 complains of shortness of breath.  Found to have non-STEMI and acute systolic CHF.  Cardiology was consulted.   Subjective   Patient seen, denies any complaints.   Assessment/Plan:    1. Acute systolic CHF-EF 30 to [id number] on echocardiogram with hypokinesis of the left ventricle and apical area as well as dyskinesis of the left apical segment.  Lasix is currently on hold due to worsening renal function.  2. Non-STEMI-patient had significantly elevated high-sensitivity troponin 3824, TTE suggested anterior infarct. Patient noted cardiac cath candidate considering multiple medical issues.  Cardiology following.  Medical management recommended.  3. ?community-acquired pneumonia-patient was empirically started on ceftriaxone and Zithromax at the time of admission for possible CAP.  At this time, does not appear patient has infectious process.  Antibiotics were discontinued.  Solu-Medrol has been discontinued.  4. Leukocytosis-today WBC is 20,000, patient has been afebrile, vitals stable, will check UA and culture, repeat chest x-ray.  Will follow the results.  5. Acute kidney injury on CKD stage IV-patient baseline creatinine 1.5, creatinine is rising today is 3.85.  Palliative care was consulted and family wants to continue with medical management for now.  Patient is not a good candidate for hemodialysis.  Nephrology consulted.  Started on IV normal saline per nephrology.  Creatinine is improving, today creatinine is 3.17.  Follow BMP in a.m. Nephrology has signed off.  6. Hypertension-blood pressures controlled, continue home medications.  7. Type 2 diabetes mellitus-continue sliding scale  insulin with NovoLog.  CBG well controlled.  8. Schizoaffective disorder-patient is on multiple psychotropic medications.  9. Hypothyroidism-continue Synthroid.    CBG: Recent Labs  Lab 04/02/19 1216 04/02/19 1703 04/02/19 2112 04/03/19 0810 04/03/19 1142  GLUCAP 136* 147* 194* 128* 183*    CBC: Recent Labs  Lab 03/28/19 0042 03/29/19 0456 03/30/19 0512 03/31/19 0832 04/03/19 1042  WBC 14.5* 15.9* 15.2* 13.7* 20.6*  HGB 9.7* 8.6* 9.1* 9.4* 10.9*  HCT 29.8* 28.3* 29.1* 29.4* 33.0*  MCV 97.4 99.6 101.0* 96.1 93.0  PLT 212 198 213 188 [id number]    Basic Metabolic Panel: Recent Labs  Lab 03/29/19 0456 03/30/19 0512 03/31/19 0832 04/01/19 0443 04/02/19 0549 04/03/19 1042  NA 140 137 136 136 134* 137  K 4.4 4.4 5.0 4.3 4.5 4.6  CL 108 105 105 106 104 109  CO2 20* 17* 17* 17* 16* 18*  GLUCOSE 207* 202* 205* 183* 130* 204*  BUN 65* 81* 89* 96* 87* 79*  CREATININE 3.36* 3.85* 3.84* 3.57* 3.34* 3.17*  CALCIUM 8.2* 7.9* 7.9* 7.5* 7.7* 8.0*  MG 2.1  --   --   --   --   --      DVT prophylaxis: Heparin  Code Status: DNR.  Family Communication: Discussed with patient's daughter at bedside also discussed with patient's son on phone/face time on daughter's phone.  Discussed patient's stable yet worrisome condition, also discussed that patient is not a candidate for aggressive interventions including hemodialysis if her renal function continues to get worse.  Discussed in detail aggressive intervention and the futility of performing CPR, mechanical ventilation considering patient's underlying medical conditions and advanced age.  Patient at this time does not want to be resuscitated and also understands that she is not a candidate for hemodialysis.  Both patient and family members agree with DNR.  Disposition Plan: To be decided based on patient's clinical course in the hospital     BMI  Estimated body mass index is 22.23 kg/m as calculated from the following:   Height  as of this encounter: 5\' 3"  (1.6 m).   Weight as of this encounter: 56.9 kg.  Scheduled medications:  . aspirin EC  81 mg Oral Daily  . atorvastatin  40 mg Oral q1800  . carvedilol  25 mg Oral BID  . clopidogrel  75 mg Oral q1800  . fluticasone  1 spray Each Nare q1800  . heparin  5,000 Units Subcutaneous Q8H  . hydrALAZINE  25 mg Oral Q8H  . insulin aspart  0-5 Units Subcutaneous QHS  . insulin aspart  0-9 Units Subcutaneous TID WC  . ipratropium-albuterol  3 mL Nebulization TID  . levothyroxine  50 mcg Oral Q0600  . loratadine  10 mg Oral Daily  . OLANZapine  15 mg Oral QHS  . sodium bicarbonate  650 mg Oral BID  . terazosin  5 mg Oral QHS    Consultants:  Cardiology  Procedures:     Antibiotics:   Anti-infectives (From admission, onward)   Start     Dose/Rate Route Frequency Ordered Stop   03/09/2019 0900  cefTRIAXone (ROCEPHIN) 2 g in sodium chloride 0.9 % 100 mL IVPB  Status:  Discontinued     2 g 200 mL/hr over 30 Minutes Intravenous Every 24 hours 03/13/2019 0859 03/30/19 0838   03/20/2019 0900  azithromycin (ZITHROMAX) 500 mg in sodium chloride 0.9 % 250 mL IVPB  Status:  Discontinued     500 mg 250 mL/hr over 60 Minutes Intravenous Every 24 hours 03/21/2019 0859 03/30/19 0838       Objective   Vitals:   04/03/19 0458 04/03/19 0751 04/03/19 1359 04/03/19 1408  BP:   (!) 142/71   Pulse:   75   Resp:   18   Temp:   98.2 F (36.8 C)   TempSrc:   Oral   SpO2:  95%  93%  Weight: 56.9 kg     Height:        Intake/Output Summary (Last 24 hours) at 04/03/2019 1509 Last data filed at 04/03/2019 0458 Gross per 24 hour  Intake 60 ml  Output 1500 ml  Net -1440 ml   Filed Weights   03/31/19 0406 04/01/19 0435 04/03/19 0458  Weight: 57.1 kg 58.1 kg 56.9 kg     Physical Examination:   General-appears in no acute distress Heart-S1-S2, regular, no murmur auscultated Lungs-clear to auscultation bilaterally, no wheezing or crackles auscultated Abdomen-soft,  nontender, no organomegaly Extremities-no edema in the lower extremities Neuro-alert, oriented x3, no focal deficit noted   Data Reviewed: I have personally reviewed following labs and imaging studies   Recent Results (from the past 240 hour(s))  Blood Culture (routine x 2)     Status: None   Collection Time: 02/26/2019  9:00 AM   Specimen: BLOOD LEFT HAND  Result Value Ref Range Status   Specimen Description   Final    BLOOD LEFT HAND Performed at [facility], [place] [address] [place], [place] [address]    Special Requests   Final    BOTTLES DRAWN AEROBIC AND ANAEROBIC Blood Culture adequate volume Performed at [place] [address], [place], [place] [address]    Culture   Final    NO GROWTH 5 DAYS Performed  at [facility], [place] [address], [place], [place] [address]    Report Status 04/01/2019 FINAL  Final  SARS Coronavirus 2 [facility] order, Performed in [facility] hospital lab) Nasopharyngeal Nasopharyngeal Swab     Status: None   Collection Time: 03/22/2019  9:00 AM   Specimen: Nasopharyngeal Swab  Result Value Ref Range Status   SARS Coronavirus 2 NEGATIVE NEGATIVE Final    Comment: (NOTE) If result is NEGATIVE SARS-CoV-2 target nucleic acids are NOT DETECTED. The SARS-CoV-2 RNA is generally detectable in upper and lower  respiratory specimens during the acute phase of infection. The lowest  concentration of SARS-CoV-2 viral copies this assay can detect is 250  copies / mL. A negative result does not preclude SARS-CoV-2 infection  and should not be used as the sole basis for treatment or other  patient management decisions.  A negative result may occur with  improper specimen collection / handling, submission of specimen other  than nasopharyngeal swab, presence of viral mutation(s) within the  areas targeted by this assay, and inadequate number of viral copies  (<250 copies / mL). A negative result must be combined with  clinical  observations, patient history, and epidemiological information. If result is POSITIVE SARS-CoV-2 target nucleic acids are DETECTED. The SARS-CoV-2 RNA is generally detectable in upper and lower  respiratory specimens dur ing the acute phase of infection.  Positive  results are indicative of active infection with SARS-CoV-2.  Clinical  correlation with patient history and other diagnostic information is  necessary to determine patient infection status.  Positive results do  not rule out bacterial infection or co-infection with other viruses. If result is PRESUMPTIVE POSTIVE SARS-CoV-2 nucleic acids MAY BE PRESENT.   A presumptive positive result was obtained on the submitted specimen  and confirmed on repeat testing.  While 2019 novel coronavirus  (SARS-CoV-2) nucleic acids may be present in the submitted sample  additional confirmatory testing may be necessary for epidemiological  and / or clinical management purposes  to differentiate between  SARS-CoV-2 and other Sarbecovirus currently known to infect humans.  If clinically indicated additional testing with an alternate test  methodology [phone]) is advised. The SARS-CoV-2 RNA is generally  detectable in upper and lower respiratory sp ecimens during the acute  phase of infection. The expected result is Negative. Fact Sheet for Patients:  StrictlyIdeas.no Fact Sheet for Healthcare Providers: BankingDealers.co.za This test is not yet approved or cleared by the [place] FDA and has been authorized for detection and/or diagnosis of SARS-CoV-2 by FDA under an Emergency Use Authorization (EUA).  This EUA will remain in effect (meaning this test can be used) for the duration of the COVID-19 declaration under Section 564(b)(1) of the Act, 21 U.S.C. section 360bbb-3(b)(1), unless the authorization is terminated or revoked sooner. Performed at [facility],  [place] [address], [place], [place] [address]   Blood Culture (routine x 2)     Status: None   Collection Time: 03/26/2019  2:13 PM   Specimen: BLOOD  Result Value Ref Range Status   Specimen Description   Final    BLOOD LEFT ARM Performed at [place] [address] [place], [place] [address]    Special Requests   Final    BOTTLES DRAWN AEROBIC ONLY Blood Culture adequate volume Performed at [place] [address], [place], [place] [address]    Culture   Final    NO GROWTH 5 DAYS Performed at [facility], [place] [address], [place], [place] [address]    Report Status 04/01/2019 FINAL  Final      BNP (last 3 results) Recent Labs    03/14/2019 0835  BNP 1,026.3*     Admission status: Inpatient: Based on patients clinical presentation and evaluation of above clinical data, I have made determination that patient meets Inpatient criteria at this time.  Time spent: 20 minutes  [facility] Pager [phone]. If 7PM-7AM, please contact night-coverage at [email or web address], Office  [phone]  password [email or web address]  04/03/2019, 3:09 PM  LOS: 7 days

## 2019-04-03 NOTE — Progress Notes (Signed)
Progress Note  Patient Name: Ashley Velez Date of Encounter: 04/03/2019  Primary Cardiologist: New   Subjective   Denies any chest pain or dyspnea.  Alert and oriented x3 this morning.  Inpatient Medications    Scheduled Meds: . aspirin EC  81 mg Oral Daily  . atorvastatin  40 mg Oral q1800  . carvedilol  25 mg Oral BID  . clopidogrel  75 mg Oral q1800  . fluticasone  1 spray Each Nare q1800  . heparin  5,000 Units Subcutaneous Q8H  . insulin aspart  0-5 Units Subcutaneous QHS  . insulin aspart  0-9 Units Subcutaneous TID WC  . ipratropium-albuterol  3 mL Nebulization TID  . levothyroxine  50 mcg Oral Q0600  . loratadine  10 mg Oral Daily  . OLANZapine  15 mg Oral QHS  . sodium bicarbonate  650 mg Oral BID  . terazosin  5 mg Oral QHS   Continuous Infusions:  PRN Meds: acetaminophen, HYDROcodone-acetaminophen   Vital Signs    Vitals:   04/02/19 2042 04/03/19 0441 04/03/19 0458 04/03/19 0751  BP: (!) 148/74 (!) 158/79    Pulse: 74 75    Resp: 18 18    Temp: (!) 97.5 F (36.4 C) 97.7 F (36.5 C)    TempSrc: Oral Oral    SpO2: 98% 96%  95%  Weight:   56.9 kg   Height:        Intake/Output Summary (Last 24 hours) at 04/03/2019 0838 Last data filed at 04/03/2019 0458 Gross per 24 hour  Intake 60 ml  Output 2300 ml  Net -2240 ml   Filed Weights   03/31/19 0406 04/01/19 0435 04/03/19 0458  Weight: 57.1 kg 58.1 kg 56.9 kg    Physical Exam   General: Frail, elderly, NAD Lungs:Clear to ausculation bilaterally. No wheezes, rales, or rhonchi. Breathing is unlabored. Cardiovascular: RRR with S1 S2. 2/6 systolic murmur Abdomen: Soft, non-tender, non-distended. Extremities: No edema.  Neuro: Alert and oriented x 3  Psych: Responds to questions appropriately with normal affect.    Labs    Chemistry Recent Labs  Lab 03/31/19 0832 04/01/19 0443 04/02/19 0549  NA 136 136 134*  K 5.0 4.3 4.5  CL 105 106 104  CO2 17* 17* 16*  GLUCOSE 205* 183*  130*  BUN 89* 96* 87*  CREATININE 3.84* 3.57* 3.34*  CALCIUM 7.9* 7.5* 7.7*  GFRNONAA 10* 11* 12*  GFRAA 12* 13* 14*  ANIONGAP 14 13 14      Hematology Recent Labs  Lab 03/29/19 0456 03/30/19 0512 03/31/19 0832  WBC 15.9* 15.2* 13.7*  RBC 2.84* 2.88* 3.06*  HGB 8.6* 9.1* 9.4*  HCT 28.3* 29.1* 29.4*  MCV 99.6 101.0* 96.1  MCH 30.3 31.6 30.7  MCHC 30.4 31.3 32.0  RDW 13.8 13.5 13.2  PLT 198 213 188    Cardiac EnzymesNo results for input(s): TROPONINI in the last 168 hours. No results for input(s): TROPIPOC in the last 168 hours.   BNP No results for input(s): BNP, PROBNP in the last 168 hours.   DDimer No results for input(s): DDIMER in the last 168 hours.   Radiology    No results found.  Telemetry    Sinus rhythm with rate 60s-70s. 10 beat run of SVT yesterday - Personally Reviewed  ECG    No new tracing - Personally Reviewed  Cardiac Studies   Echo 03/28/2019:  1. Mild dyskinesis of the left ventricular, entire apical segment. 2. Severe hypokinesis of the  left ventricular, mid-apical anteroseptal wall and anterior wall. 3. The left ventricle has moderate-severely reduced systolic function, with an ejection fraction of 30-35%. The cavity size was normal. Left ventricular diastolic Doppler parameters are consistent with pseudonormalization. Elevated mean left atrial  pressure. 4. The right ventricle has normal systolic function. The cavity was normal. There is no increase in right ventricular wall thickness. Right ventricular systolic pressure is mildly elevated. 5. Left atrial size was mildly dilated. 6. Trivial pericardial effusion is present. 7. The mitral valve is grossly normal. Mild thickening of the mitral valve leaflet. Mitral valve regurgitation is moderate by color flow Doppler. The MR jet is centrally-directed. 8. The aortic valve is tricuspid. Mild thickening of the aortic valve. Aortic valve regurgitation is trivial by color flow Doppler.  9. The aorta is normal unless otherwise noted. 10. The inferior vena cava was dilated in size with <50% respiratory variability. 11. No intracardiac thrombi or masses were visualized with Definity contrast imaging.  Echo 05/2013 Study Conclusions   - Left ventricle: The cavity size was normal. Wall thickness  was increased in a pattern of mild LVH. There was mild  focal basal hypertrophy of the septum. Systolic function  was normal. The estimated ejection fraction was in the  range of 55% to 65%. Wall motion was normal; there were no  regional wall motion abnormalities. Doppler parameters are  consistent with abnormal left ventricular relaxation  (grade 1 diastolic dysfunction).  - Aortic valve: Trivial regurgitation.  - Mitral valve: Calcified annulus. Mild regurgitation.  - Pericardium, extracardiac: A trivial pericardial effusion  was identified.  Patient Profile     84 y.o. female with a hx of DM, HTN, HLD, CKD-4, schizoaffective disorder, hypothyroidism, and strokewho is being seen today for the evaluation of elevated troponin after admit for hypoxia, CHF with BNP of 1026at the request of Dr. Posey Pronto.  Assessment & Plan    NSTEMI: likely late presentation of MI, initial HsTn 3824 which was downtrending.  TTE suggestive of an anterior infarct, LV EF 30-35%.  She denies anginal symptoms.  Not a candidate for cath in setting of AKI on CKD and frailty.  Medical management  - Continue ASA, plavix, statin - Completed 48 hours on heparin gtt, have discontineud  Presumed ischemic cardiomyopathy: LVEF 25-30% -Worsening renal function with diuresis, holding lasix -No ACE/ARB given renal function -Continue carvedilol 25mg  BID -Given elevated BP, will add hydralazine 25 mg TID.  Can also plan to add isordil if tolerating  Acute on chronic kidney disease Stage IV: Cr up to 3.85 (baseline ~1.5-2.0), improved with IV fluids, 3.34 yesterday. -IVF hydration per  nephrology: currently holding IV fluids  HTN: elevated pressures yesterday.  On carvedilol 25 mg BID.  Not a candidate for ACE/ARB given renal function -Will start hydralazine 25 mg TID   Donato Heinz, MD   For questions or updates, please contact   Please consult [email or web address] for contact info under Cardiology/STEMI.

## 2019-04-03 NOTE — Progress Notes (Signed)
Patient ID: [name], female   DOB: 05-May-1935, 84 y.o.   MRN: [id number]  This NP spoke to daughter by phone as follow-up for palliative medicine needs and emotional support.  Daughter tells me that patient and family together made decision for DO NOT RESUSCITATE status and all understand that the patient is not a hemodialysis candidate.   "I am comfortable with that".    Discussed with patient disposition back to skilled nursing facility with hospice versus palliative services depending on patient outcomes.   Daughter is open to treating the treatable and remains hopeful for patient improvement, I shared with her my concern that the patient is high risk for decompensation secondary to her medical co-morbidities.    We again discussed concepts specific to human mortality and the limitations of medical interventions to prolong quality of life when the body begins to fail to thrive.  Daughter verbalizes an understanding of the seriousness of her mother's medical condition but at this point in time continues to remain optimistic for improvement.   Questions and concerns addressed    Emotional support offered.  Total time spent on the unit was 20 minutes     Discussed with bedsdie nurse, Dr Darrick Meigs on the unit making rounds  Greater than 50% of the time was spent in counseling and coordination of care  Wadie Lessen NP  Palliative Medicine Team Team Phone # [phone] Pager [phone]

## 2019-04-04 LAB — CBC
HCT: 31.3 % — ABNORMAL LOW (ref 36.0–46.0)
Hemoglobin: 10.1 g/dL — ABNORMAL LOW (ref 12.0–15.0)
MCH: 30.5 pg (ref 26.0–34.0)
MCHC: 32.3 g/dL (ref 30.0–36.0)
MCV: 94.6 fL (ref 80.0–100.0)
Platelets: 252 10*3/uL (ref 150–400)
RBC: 3.31 MIL/uL — ABNORMAL LOW (ref 3.87–5.11)
RDW: 13.2 % (ref 11.5–15.5)
WBC: 22.7 10*3/uL — ABNORMAL HIGH (ref 4.0–10.5)
nRBC: 0 % (ref 0.0–0.2)

## 2019-04-04 LAB — GLUCOSE, CAPILLARY
Glucose-Capillary: 142 mg/dL — ABNORMAL HIGH (ref 70–99)
Glucose-Capillary: 153 mg/dL — ABNORMAL HIGH (ref 70–99)
Glucose-Capillary: 190 mg/dL — ABNORMAL HIGH (ref 70–99)
Glucose-Capillary: 192 mg/dL — ABNORMAL HIGH (ref 70–99)

## 2019-04-04 LAB — BASIC METABOLIC PANEL
Anion gap: 8 (ref 5–15)
BUN: 76 mg/dL — ABNORMAL HIGH (ref 8–23)
CO2: 19 mmol/L — ABNORMAL LOW (ref 22–32)
Calcium: 8.2 mg/dL — ABNORMAL LOW (ref 8.9–10.3)
Chloride: 110 mmol/L (ref 98–111)
Creatinine, Ser: 3.18 mg/dL — ABNORMAL HIGH (ref 0.44–1.00)
GFR calc Af Amer: 15 mL/min — ABNORMAL LOW (ref >60)
GFR calc non Af Amer: 13 mL/min — ABNORMAL LOW (ref >60)
Glucose, Bld: 169 mg/dL — ABNORMAL HIGH (ref 70–99)
Potassium: 4.7 mmol/L (ref 3.5–5.1)
Sodium: 137 mmol/L (ref 135–145)

## 2019-04-04 LAB — URINE CULTURE: Culture: 80000 — AB

## 2019-04-04 LAB — BRAIN NATRIURETIC PEPTIDE: B Natriuretic Peptide: 1419.4 pg/mL — ABNORMAL HIGH (ref 0.0–100.0)

## 2019-04-04 MED ORDER — INFLUENZA VAC A&B SA ADJ QUAD 0.5 ML IM PRSY
0.5000 mL | PREFILLED_SYRINGE | INTRAMUSCULAR | Status: DC
  Filled 2019-04-04: qty 0.5

## 2019-04-04 MED ORDER — SODIUM CHLORIDE 0.45 % IV SOLN
INTRAVENOUS | Status: AC
  Administered 2019-04-04: 17:00:00 via INTRAVENOUS

## 2019-04-04 MED ORDER — IPRATROPIUM-ALBUTEROL 0.5-2.5 (3) MG/3ML IN SOLN
3.0000 mL | Freq: Two times a day (BID) | RESPIRATORY_TRACT | Status: DC
  Administered 2019-04-04: 3 mL via RESPIRATORY_TRACT
  Filled 2019-04-04: qty 3

## 2019-04-04 MED ORDER — IPRATROPIUM-ALBUTEROL 0.5-2.5 (3) MG/3ML IN SOLN
3.0000 mL | RESPIRATORY_TRACT | Status: DC | PRN
  Administered 2019-04-11 (×2): 3 mL via RESPIRATORY_TRACT
  Filled 2019-04-04 (×2): qty 3

## 2019-04-04 MED ORDER — ZINC OXIDE 40 % EX OINT
TOPICAL_OINTMENT | Freq: Three times a day (TID) | CUTANEOUS | Status: DC | PRN
  Administered 2019-04-04: 1 via TOPICAL
  Administered 2019-04-04 – 2019-04-05 (×2): via TOPICAL
  Filled 2019-04-04 (×2): qty 57

## 2019-04-04 MED ORDER — SODIUM CHLORIDE 0.9 % IV SOLN
1.0000 g | INTRAVENOUS | Status: DC
  Administered 2019-04-04 – 2019-04-10 (×7): 1 g via INTRAVENOUS
  Filled 2019-04-04: qty 1
  Filled 2019-04-04: qty 10
  Filled 2019-04-04 (×4): qty 1
  Filled 2019-04-04: qty 10
  Filled 2019-04-04: qty 1

## 2019-04-04 MED ORDER — NYSTATIN 100000 UNIT/GM EX POWD
Freq: Three times a day (TID) | CUTANEOUS | Status: DC
  Administered 2019-04-04 – 2019-04-11 (×22): via TOPICAL
  Filled 2019-04-04: qty 15

## 2019-04-04 NOTE — Progress Notes (Signed)
Patient with no output since 10pm and foley inplace, C/O lower abd pain, bladder scan >500cc, oncall provider Kirby-NP notified, order written to irrigate foley or replace foley if irrigation was not effective, post foley irrigation only 100cc urine out and patient still C/O lower abd pain foley replaced and 400cc output and patient expressed relief of lower abd pain. Will continue to assess patient.

## 2019-04-04 NOTE — Care Management Important Message (Signed)
Important Message  Patient Details  Name: Ashley Velez MRN: [id number] Date of Birth: 1935-01-15   Medicare Important Message Given:  Yes. CMA printed out IM for the Case Management Nurse or CSW to give to patient.      [name] 04/04/2019, 10:45 AM

## 2019-04-04 NOTE — Progress Notes (Signed)
[facility]  PROGRESS NOTE  Ashley Velez [id number] DOB: 06/09/1935 DOA: 03/26/2019 PCP: Crist Infante, MD   Brief HPI:   84 year old female with history of diabetes mellitus type 2, hypertension, hyperlipidemia, stage IV CKD, schizoaffective disorder, hypothyroidism admitted on 02/28/2019 complains of shortness of breath.  Found to have non-STEMI and acute systolic CHF.  Cardiology was consulted.   Subjective   Patient seen and examined, complains of pain in the pelvic area   Assessment/Plan:    1. Acute systolic CHF-EF 30 to [id number] on echocardiogram with hypokinesis of the left ventricle and apical area as well as dyskinesis of the left apical segment.  Lasix is currently on hold due to worsening renal function.  Cardiology has signed off  2. Tinea cruris/candidiasis-we will start nystatin powder 3 times daily, Desitin cream.  3. ?  UTI-patient has abnormal UA, complains of dysuria.  Urine culture has been obtained.  Will start ceftriaxone 1 g IV daily.  WBC is elevated 22,000.  She has been afebrile.  Follow CBC in a.m.  4. Non-STEMI-patient had significantly elevated high-sensitivity troponin 3824, TTE suggested anterior infarct. Patient noted cardiac cath candidate considering multiple medical issues.  Cardiology following.  Medical management recommended.  5. ?community-acquired pneumonia-patient was empirically started on ceftriaxone and Zithromax at the time of admission for possible CAP.  At this time, does not appear patient has infectious process.  Antibiotics were discontinued.  Solu-Medrol has been discontinued.  6. Acute kidney injury on CKD stage IV-patient baseline creatinine 1.5, creatinine is rising today is 3.85.  Palliative care was consulted and family wants to continue with medical management for now.  Patient is not a good candidate for hemodialysis.  Nephrology consulted.  Started on IV normal saline per nephrology.  Creatinine is improving, today  creatinine is 3.18.  We will continue with gentle IV hydration with 0.45 normal saline at 50 mill per hour for next 48 hours follow BMP in a.m. Nephrology has signed off.  7. Hypertension-blood pressures controlled, continue home medications.  8. Type 2 diabetes mellitus-continue sliding scale insulin with NovoLog.  CBG well controlled.  9. Schizoaffective disorder-patient is on multiple psychotropic medications.  10. Hypothyroidism-continue Synthroid.    CBG: Recent Labs  Lab 04/03/19 1142 04/03/19 1700 04/03/19 2151 04/04/19 0815 04/04/19 1151  GLUCAP 183* 114* 155* 142* 153*    CBC: Recent Labs  Lab 03/29/19 0456 03/30/19 0512 03/31/19 0832 04/03/19 1042 04/04/19 0419  WBC 15.9* 15.2* 13.7* 20.6* 22.7*  HGB 8.6* 9.1* 9.4* 10.9* 10.1*  HCT 28.3* 29.1* 29.4* 33.0* 31.3*  MCV 99.6 101.0* 96.1 93.0 94.6  PLT 198 213 188 245 [id number]    Basic Metabolic Panel: Recent Labs  Lab 03/29/19 0456  03/31/19 0832 04/01/19 0443 04/02/19 0549 04/03/19 1042 04/04/19 0419  NA 140   < > 136 136 134* 137 137  K 4.4   < > 5.0 4.3 4.5 4.6 4.7  CL 108   < > 105 106 104 109 110  CO2 20*   < > 17* 17* 16* 18* 19*  GLUCOSE 207*   < > 205* 183* 130* 204* 169*  BUN 65*   < > 89* 96* 87* 79* 76*  CREATININE 3.36*   < > 3.84* 3.57* 3.34* 3.17* 3.18*  CALCIUM 8.2*   < > 7.9* 7.5* 7.7* 8.0* 8.2*  MG 2.1  --   --   --   --   --   --    < > = values in this  interval not displayed.     DVT prophylaxis: Heparin  Code Status: DNR.  Family Communication: Discussed with patient's daughter at bedside also discussed with patient's son on phone/face time on daughter's phone.  Discussed patient's stable yet worrisome condition, also discussed that patient is not a candidate for aggressive interventions including hemodialysis if her renal function continues to get worse.  Discussed in detail aggressive intervention and the futility of performing CPR, mechanical ventilation considering patient's  underlying medical conditions and advanced age.  Patient at this time does not want to be resuscitated and also understands that she is not a candidate for hemodialysis.  Both patient and family members agree with DNR.  Disposition Plan: To be decided based on patient's clinical course in the hospital     BMI  Estimated body mass index is 23.01 kg/m as calculated from the following:   Height as of this encounter: 5\' 3"  (1.6 m).   Weight as of this encounter: 58.9 kg.  Scheduled medications:  . aspirin EC  81 mg Oral Daily  . atorvastatin  40 mg Oral q1800  . carvedilol  25 mg Oral BID  . clopidogrel  75 mg Oral q1800  . fluticasone  1 spray Each Nare q1800  . heparin  5,000 Units Subcutaneous Q8H  . hydrALAZINE  25 mg Oral Q8H  . [START ON 04/05/2019] influenza vaccine adjuvanted  0.5 mL Intramuscular Tomorrow-1000  . insulin aspart  0-5 Units Subcutaneous QHS  . insulin aspart  0-9 Units Subcutaneous TID WC  . ipratropium-albuterol  3 mL Nebulization BID  . levothyroxine  50 mcg Oral Q0600  . loratadine  10 mg Oral Daily  . OLANZapine  15 mg Oral QHS  . sodium bicarbonate  650 mg Oral BID  . terazosin  5 mg Oral QHS    Consultants:  Cardiology  Procedures:     Antibiotics:   Anti-infectives (From admission, onward)   Start     Dose/Rate Route Frequency Ordered Stop   04/04/19 1100  cefTRIAXone (ROCEPHIN) 1 g in sodium chloride 0.9 % 100 mL IVPB     1 g 200 mL/hr over 30 Minutes Intravenous Every 24 hours 04/04/19 1037     03/17/2019 0900  cefTRIAXone (ROCEPHIN) 2 g in sodium chloride 0.9 % 100 mL IVPB  Status:  Discontinued     2 g 200 mL/hr over 30 Minutes Intravenous Every 24 hours 02/27/2019 0859 03/30/19 0838   03/04/2019 0900  azithromycin (ZITHROMAX) 500 mg in sodium chloride 0.9 % 250 mL IVPB  Status:  Discontinued     500 mg 250 mL/hr over 60 Minutes Intravenous Every 24 hours 03/17/2019 0859 03/30/19 0838       Objective   Vitals:   04/03/19 2153 04/04/19  0505 04/04/19 0745 04/04/19 1400  BP: (!) 150/76 (!) 155/74  (!) 123/59  Pulse: 86 86  80  Resp: 18 20  18   Temp: 98.9 F (37.2 C) 98.8 F (37.1 C)  97.7 F (36.5 C)  TempSrc: Oral Oral  Oral  SpO2: 93% 94% 98% 97%  Weight:  58.9 kg    Height:        Intake/Output Summary (Last 24 hours) at 04/04/2019 1640 Last data filed at 04/04/2019 1300 Gross per 24 hour  Intake 700 ml  Output 1000 ml  Net -300 ml   Filed Weights   04/01/19 0435 04/03/19 0458 04/04/19 0505  Weight: 58.1 kg 56.9 kg 58.9 kg     Physical Examination:  General-appears in no acute distress Heart-S1-S2, regular, no murmur auscultated Lungs-clear to auscultation bilaterally, no wheezing or crackles auscultated Abdomen-soft, nontender, no organomegaly Extremities-no edema in the lower extremities Neuro-alert, oriented x3, no focal deficit noted Skin-significant erythema noted in the groinsextending up to the buttocks  Data Reviewed: I have personally reviewed following labs and imaging studies   Recent Results (from the past 240 hour(s))  Blood Culture (routine x 2)     Status: None   Collection Time: 03/20/2019  9:00 AM   Specimen: BLOOD LEFT HAND  Result Value Ref Range Status   Specimen Description   Final    BLOOD LEFT HAND Performed at [facility], [place] [address], [place], [place] [address]    Special Requests   Final    BOTTLES DRAWN AEROBIC AND ANAEROBIC Blood Culture adequate volume Performed at [facility] [address], [place], [place] [address]    Culture   Final    NO GROWTH 5 DAYS Performed at [facility], [place] [address], [place], [place] [address]    Report Status 04/01/2019 FINAL  Final  SARS Coronavirus 2 [facility] order, Performed in [facility], In hospital lab) Nasopharyngeal Nasopharyngeal Swab     Status: None   Collection Time: 03/24/2019  9:00 AM   Specimen: Nasopharyngeal Swab  Result Value Ref Range Status   SARS Coronavirus 2  NEGATIVE NEGATIVE Final    Comment: (NOTE) If result is NEGATIVE SARS-CoV-2 target nucleic acids are NOT DETECTED. The SARS-CoV-2 RNA is generally detectable in upper and lower  respiratory specimens during the acute phase of infection. The lowest  concentration of SARS-CoV-2 viral copies this assay can detect is 250  copies / mL. A negative result does not preclude SARS-CoV-2 infection  and should not be used as the sole basis for treatment or other  patient management decisions.  A negative result may occur with  improper specimen collection / handling, submission of specimen other  than nasopharyngeal swab, presence of viral mutation(s) within the  areas targeted by this assay, and inadequate number of viral copies  (<250 copies / mL). A negative result must be combined with clinical  observations, patient history, and epidemiological information. If result is POSITIVE SARS-CoV-2 target nucleic acids are DETECTED. The SARS-CoV-2 RNA is generally detectable in upper and lower  respiratory specimens dur ing the acute phase of infection.  Positive  results are indicative of active infection with SARS-CoV-2.  Clinical  correlation with patient history and other diagnostic information is  necessary to determine patient infection status.  Positive results do  not rule out bacterial infection or co-infection with other viruses. If result is PRESUMPTIVE POSTIVE SARS-CoV-2 nucleic acids MAY BE PRESENT.   A presumptive positive result was obtained on the submitted specimen  and confirmed on repeat testing.  While 2019 novel coronavirus  (SARS-CoV-2) nucleic acids may be present in the submitted sample  additional confirmatory testing may be necessary for epidemiological  and / or clinical management purposes  to differentiate between  SARS-CoV-2 and other Sarbecovirus currently known to infect humans.  If clinically indicated additional testing with an alternate test  methodology [phone])  is advised. The SARS-CoV-2 RNA is generally  detectable in upper and lower respiratory sp ecimens during the acute  phase of infection. The expected result is Negative. Fact Sheet for Patients:  StrictlyIdeas.no Fact Sheet for Healthcare Providers: BankingDealers.co.za This test is not yet approved or cleared by the [place] FDA and has  been authorized for detection and/or diagnosis of SARS-CoV-2 by FDA under an Emergency Use Authorization (EUA).  This EUA will remain in effect (meaning this test can be used) for the duration of the COVID-19 declaration under Section 564(b)(1) of the Act, 21 U.S.C. section 360bbb-3(b)(1), unless the authorization is terminated or revoked sooner. Performed at [facility], [place] [address], [place], [place] [address]   Blood Culture (routine x 2)     Status: None   Collection Time: 02/26/2019  2:13 PM   Specimen: BLOOD  Result Value Ref Range Status   Specimen Description   Final    BLOOD LEFT ARM Performed at [facility] [address] [place], [place] [address]    Special Requests   Final    BOTTLES DRAWN AEROBIC ONLY Blood Culture adequate volume Performed at [facility] [address] [place], [place] [address]    Culture   Final    NO GROWTH 5 DAYS Performed at [facility], [place] [address] [place], [place] [address]    Report Status 04/01/2019 FINAL  Final  Culture, Urine     Status: Abnormal   Collection Time: 04/03/19  2:58 PM   Specimen: Urine, Catheterized  Result Value Ref Range Status   Specimen Description   Final    URINE, CATHETERIZED Performed at [facility] [address], [place], [place] [address]    Special Requests   Final    NONE Performed at [facility], [place] [address] [place], [place] [address]    Culture 80,000 COLONIES/mL YEAST (A)  Final   Report Status  04/04/2019 FINAL  Final      BNP (last 3 results) Recent Labs    03/06/2019 0835 04/04/19 0419  BNP 1,026.3* 1,419.4*     Admission status: Inpatient: Based on patients clinical presentation and evaluation of above clinical data, I have made determination that patient meets Inpatient criteria at this time.  Time spent: 20 minutes  [facility] Pager [phone]. If 7PM-7AM, please contact night-coverage at [email or web address], Office  [phone]  password [email or web address]  04/04/2019, 4:40 PM  LOS: 8 days

## 2019-04-04 NOTE — Progress Notes (Addendum)
Progress Note  Patient Name: Ashley Velez Date of Encounter: 04/04/2019  Primary Cardiologist: Donato Heinz, MD   Subjective   Sleepy this morning. Having difficulty with deep breath.   Inpatient Medications    Scheduled Meds: . aspirin EC  81 mg Oral Daily  . atorvastatin  40 mg Oral q1800  . carvedilol  25 mg Oral BID  . clopidogrel  75 mg Oral q1800  . fluticasone  1 spray Each Nare q1800  . heparin  5,000 Units Subcutaneous Q8H  . hydrALAZINE  25 mg Oral Q8H  . insulin aspart  0-5 Units Subcutaneous QHS  . insulin aspart  0-9 Units Subcutaneous TID WC  . ipratropium-albuterol  3 mL Nebulization TID  . levothyroxine  50 mcg Oral Q0600  . loratadine  10 mg Oral Daily  . OLANZapine  15 mg Oral QHS  . sodium bicarbonate  650 mg Oral BID  . terazosin  5 mg Oral QHS   Continuous Infusions:  PRN Meds: acetaminophen, HYDROcodone-acetaminophen   Vital Signs    Vitals:   04/03/19 2045 04/03/19 2153 04/04/19 0505 04/04/19 0745  BP:  (!) 150/76 (!) 155/74   Pulse:  86 86   Resp:  18 20   Temp:  98.9 F (37.2 C) 98.8 F (37.1 C)   TempSrc:  Oral Oral   SpO2: 93% 93% 94% 98%  Weight:   58.9 kg   Height:        Intake/Output Summary (Last 24 hours) at 04/04/2019 0820 Last data filed at 04/04/2019 0530 Gross per 24 hour  Intake 100 ml  Output 1000 ml  Net -900 ml   Last 3 Weights 04/04/2019 04/03/2019 04/01/2019  Weight (lbs) 129 lb 14.4 oz 125 lb 8 oz 128 lb  Weight (kg) 58.922 kg 56.926 kg 58.06 kg      Telemetry    SR at rate of 80s - Personally Reviewed  ECG    No new tracing   Physical Exam   GEN: Thin frail ill appearing female in no acute distress.   Neck: No JVD Cardiac: RRR, 2/6 systolic murmurs, rubs, or gallops.  Respiratory: Clear to auscultation bilaterally. GI: Soft, nontender, non-distended  MS: No edema; No deformity. Neuro:  Nonfocal  Psych: Normal affect   Labs    High Sensitivity Troponin:   Recent Labs  Lab 03/25/2019  1413 03/24/2019 1533 03/28/19 0042 03/28/19 0538  TROPONINIHS 3,824* 3,608* 3,696* 3,588*      Chemistry Recent Labs  Lab 04/02/19 0549 04/03/19 1042 04/04/19 0419  NA 134* 137 137  K 4.5 4.6 4.7  CL 104 109 110  CO2 16* 18* 19*  GLUCOSE 130* 204* 169*  BUN 87* 79* 76*  CREATININE 3.34* 3.17* 3.18*  CALCIUM 7.7* 8.0* 8.2*  GFRNONAA 12* 13* 13*  GFRAA 14* 15* 15*  ANIONGAP 14 10 8      Hematology Recent Labs  Lab 03/31/19 0832 04/03/19 1042 04/04/19 0419  WBC 13.7* 20.6* 22.7*  RBC 3.06* 3.55* 3.31*  HGB 9.4* 10.9* 10.1*  HCT 29.4* 33.0* 31.3*  MCV 96.1 93.0 94.6  MCH 30.7 30.7 30.5  MCHC 32.0 33.0 32.3  RDW 13.2 13.2 13.2  PLT 188 245 252    BNP Recent Labs  Lab 04/04/19 0419  BNP 1,419.4*     Radiology    Dg Chest Port 1v Same Day  Result Date: 04/03/2019 CLINICAL DATA:  Per order request- PNA. SOB. Pt admitted due to hypoxia. Has h/o stroke, DM, HTN.  Nonsmoker. EXAM: PORTABLE CHEST 1 VIEW COMPARISON:  03/03/2019 FINDINGS: The heart is enlarged. There are bilateral pleural effusions. Bibasilar opacities now obscure the hemidiaphragms on both sides. Lung apices are spared. IMPRESSION: Bibasilar opacities and pleural effusions favoring pulmonary edema. Multifocal pneumonia could have a similar appearance. Electronically Signed   By: Nolon Nations M.D.   On: 04/03/2019 15:37    Cardiac Studies   Echo 03/28/2019:  1. Mild dyskinesis of the left ventricular, entire apical segment. 2. Severe hypokinesis of the left ventricular, mid-apical anteroseptal wall and anterior wall. 3. The left ventricle has moderate-severely reduced systolic function, with an ejection fraction of 30-35%. The cavity size was normal. Left ventricular diastolic Doppler parameters are consistent with pseudonormalization. Elevated mean left atrial  pressure. 4. The right ventricle has normal systolic function. The cavity was normal. There is no increase in right ventricular wall  thickness. Right ventricular systolic pressure is mildly elevated. 5. Left atrial size was mildly dilated. 6. Trivial pericardial effusion is present. 7. The mitral valve is grossly normal. Mild thickening of the mitral valve leaflet. Mitral valve regurgitation is moderate by color flow Doppler. The MR jet is centrally-directed. 8. The aortic valve is tricuspid. Mild thickening of the aortic valve. Aortic valve regurgitation is trivial by color flow Doppler. 9. The aorta is normal unless otherwise noted. 10. The inferior vena cava was dilated in size with <50% respiratory variability. 11. No intracardiac thrombi or masses were visualized with Definity contrast imaging.  Patient Profile     84 y.o. female with a hx of DM, HTN, HLD, CKD-4, schizoaffective disorder, hypothyroidism, and strokewho is being seen  for the evaluation of elevated troponin after admit for hypoxia,CHF with BNP of 1026at the request of Dr. Posey Pronto.  Assessment & Plan    1. NSTEMI with likely late presenting MI -Hs-troponin 3824 on admit then trended down. Echo showed LVEF of 30-35% with possible anterior infract. No chest pain. Treated with IV heparin for 48 hours. Recommended medication therapy due to renal function and fragility. Continue ASA, statin and BB.   2. Presumed ICM - LVEF of 30-35%. BNP of 1026 initially>>> 1419 today.  - Worsen renal function with diuresis>> held>> now improving after hydration.  - No ACE/ARB given renal function -Continue carvedilol 25mg  BID and hydralazine 25mg  TID - Consider adding Isordil  3. Acute on CKD stage IV - Baseline Scr of 1.5-2.0 - Scr increased to 3.85>>now improving, 3.18 today  4. HTN - BP improving.  - AS above  For questions or updates, please contact [place] Please consult [email or web address] for contact info under        Signed, Leanor Kail, PA  04/04/2019, 8:20 AM    Patient examined chart reviewed SEMI with stable hemodynamics and no  chest pain ACE held due to renal function Not a candidate for cath due to ARF/age and co morbidities Exam with basilar atelectasis Cardiology has nothing further to add  Jenkins Rouge

## 2019-04-05 LAB — CBC
HCT: 26.1 % — ABNORMAL LOW (ref 36.0–46.0)
Hemoglobin: 8.5 g/dL — ABNORMAL LOW (ref 12.0–15.0)
MCH: 31.6 pg (ref 26.0–34.0)
MCHC: 32.6 g/dL (ref 30.0–36.0)
MCV: 97 fL (ref 80.0–100.0)
Platelets: 237 10*3/uL (ref 150–400)
RBC: 2.69 MIL/uL — ABNORMAL LOW (ref 3.87–5.11)
RDW: 13.5 % (ref 11.5–15.5)
WBC: 16.2 10*3/uL — ABNORMAL HIGH (ref 4.0–10.5)
nRBC: 0 % (ref 0.0–0.2)

## 2019-04-05 LAB — GLUCOSE, CAPILLARY
Glucose-Capillary: 138 mg/dL — ABNORMAL HIGH (ref 70–99)
Glucose-Capillary: 246 mg/dL — ABNORMAL HIGH (ref 70–99)
Glucose-Capillary: 219 mg/dL — ABNORMAL HIGH (ref 70–99)
Glucose-Capillary: 111 mg/dL — ABNORMAL HIGH (ref 70–99)

## 2019-04-05 LAB — BASIC METABOLIC PANEL
Anion gap: 6 (ref 5–15)
BUN: 69 mg/dL — ABNORMAL HIGH (ref 8–23)
CO2: 20 mmol/L — ABNORMAL LOW (ref 22–32)
Calcium: 7.8 mg/dL — ABNORMAL LOW (ref 8.9–10.3)
Chloride: 110 mmol/L (ref 98–111)
Creatinine, Ser: 3.14 mg/dL — ABNORMAL HIGH (ref 0.44–1.00)
GFR calc Af Amer: 15 mL/min — ABNORMAL LOW (ref >60)
GFR calc non Af Amer: 13 mL/min — ABNORMAL LOW (ref >60)
Glucose, Bld: 145 mg/dL — ABNORMAL HIGH (ref 70–99)
Potassium: 4.3 mmol/L (ref 3.5–5.1)
Sodium: 136 mmol/L (ref 135–145)

## 2019-04-05 NOTE — Progress Notes (Signed)
PROGRESS NOTE    Ashley Velez  [id number] DOB: July 08, 1935 DOA: 03/04/2019 PCP: Crist Infante, MD    Brief Narrative:  84 year old female with history of diabetes mellitus type 2, hypertension, hyperlipidemia, stage IV CKD, schizoaffective disorder, hypothyroidism admitted on 03/01/2019 complains of shortness of breath.  Found to have non-STEMI and acute systolic CHF.  Cardiology was consulted.  Assessment & Plan:   Active Problems:   AKI (acute kidney injury) ([place])   Palliative care by specialist   DNR (do not resuscitate) discussion   Non-ST elevation (NSTEMI) myocardial infarction ([place])   Acute systolic heart failure ([place])   Chronic kidney disease (CKD), stage IV (severe) ([place])   Adult failure to thrive   1. Acute systolic CHF-EF 30 to [id number] on echocardiogram with hypokinesis of the left ventricle and apical area as well as dyskinesis of the left apical segment. Cardiology has signed off, since recommending torsemide, tolerating  2. Tinea cruris/candidiasis-we will start nystatin powder 3 times daily, Desitin cream.  3. ?  UTI-patient has abnormal UA, complains of dysuria.  Urine culture has been obtained.  Will start ceftriaxone 1 g IV daily.  WBC is elevated 22,000.  She has been afebrile.  WBC improved. Would cont abx. Repeat CBC in AM.  4. Non-STEMI-patient had significantly elevated high-sensitivity troponin 3824, TTE suggested anterior infarct. Patient noted cardiac cath candidate considering multiple medical issues.  Cardiology following.  Medical management recommended.  5. ?community-acquired pneumonia-patient was empirically started on ceftriaxone and Zithromax at the time of admission for possible CAP.  At this time, does not appear patient has infectious process.  PNA specific antibiotics were discontinued.  Solu-Medrol has been discontinued.  6. Acute kidney injury on CKD stage IV-patient baseline creatinine 1.5, creatinine is rising today is 3.85.   Palliative care was consulted and family wants to continue with medical management for now.  Patient is not a good candidate for hemodialysis.  Nephrology consulted.  Started on IV normal saline per nephrology.  Creatinine is improving, today creatinine is 3.18.  We will continue with gentle IV hydration with 0.45 normal saline at 50 mill per hour for next 48 hours follow BMP in a.m. Nephrology has signed off.  7. Hypertension-blood pressures controlled, continue home medications. Stable  8. Type 2 diabetes mellitus-continue sliding scale insulin with NovoLog.  CBG well controlled at this time  9. Schizoaffective disorder-patient is on multiple psychotropic medications. Stable at this time  10. Hypothyroidism-continue Synthroid.  DVT prophylaxis: Heparin subq Code Status: DNR Family Communication: Pt in room, family not at bedside Disposition Plan: Uncertain at this time  Consultants:     Procedures:     Antimicrobials: Anti-infectives (From admission, onward)   Start     Dose/Rate Route Frequency Ordered Stop   04/04/19 1100  cefTRIAXone (ROCEPHIN) 1 g in sodium chloride 0.9 % 100 mL IVPB     1 g 200 mL/hr over 30 Minutes Intravenous Every 24 hours 04/04/19 1037     03/08/2019 0900  cefTRIAXone (ROCEPHIN) 2 g in sodium chloride 0.9 % 100 mL IVPB  Status:  Discontinued     2 g 200 mL/hr over 30 Minutes Intravenous Every 24 hours 03/16/2019 0859 03/30/19 0838   03/26/2019 0900  azithromycin (ZITHROMAX) 500 mg in sodium chloride 0.9 % 250 mL IVPB  Status:  Discontinued     500 mg 250 mL/hr over 60 Minutes Intravenous Every 24 hours 03/06/2019 0859 03/30/19 0838       Subjective: Without complaints this AM  Objective:  Vitals:   04/04/19 1945 04/04/19 2148 04/05/19 0449 04/05/19 1356  BP:  137/61 124/61 128/71  Pulse:  80 70 77  Resp:  16 20 19   Temp:  98.6 F (37 C) 98.8 F (37.1 C) 98.8 F (37.1 C)  TempSrc:  Oral Oral Oral  SpO2: 97% 96% 97% 97%  Weight:       Height:        Intake/Output Summary (Last 24 hours) at 04/05/2019 1808 Last data filed at 04/05/2019 1500 Gross per 24 hour  Intake 1529.07 ml  Output -  Net 1529.07 ml   Filed Weights   04/01/19 0435 04/03/19 0458 04/04/19 0505  Weight: 58.1 kg 56.9 kg 58.9 kg    Examination:  General exam: Appears calm and comfortable  Respiratory system: Clear to auscultation. Respiratory effort normal. Cardiovascular system: S1 & S2 heard, RRR Gastrointestinal system: Abdomen is nondistended, soft and nontender. No organomegaly or masses felt. Normal bowel sounds heard. Central nervous system: Alert and oriented. No focal neurological deficits. Extremities: Symmetric 5 x 5 power. Skin: No rashes, lesions Psychiatry: Judgement and insight appear normal. Mood & affect appropriate.   Data Reviewed: I have personally reviewed following labs and imaging studies  CBC: Recent Labs  Lab 03/30/19 0512 03/31/19 0832 04/03/19 1042 04/04/19 0419 04/05/19 0408  WBC 15.2* 13.7* 20.6* 22.7* 16.2*  HGB 9.1* 9.4* 10.9* 10.1* 8.5*  HCT 29.1* 29.4* 33.0* 31.3* 26.1*  MCV 101.0* 96.1 93.0 94.6 97.0  PLT 213 188 245 252 [id number]   Basic Metabolic Panel: Recent Labs  Lab 04/01/19 0443 04/02/19 0549 04/03/19 1042 04/04/19 0419 04/05/19 0408  NA 136 134* 137 137 136  K 4.3 4.5 4.6 4.7 4.3  CL 106 104 109 110 110  CO2 17* 16* 18* 19* 20*  GLUCOSE 183* 130* 204* 169* 145*  BUN 96* 87* 79* 76* 69*  CREATININE 3.57* 3.34* 3.17* 3.18* 3.14*  CALCIUM 7.5* 7.7* 8.0* 8.2* 7.8*   GFR: Estimated Creatinine Clearance: 11.4 mL/min (A) (by C-G formula based on SCr of 3.14 mg/dL (H)). Liver Function Tests: No results for input(s): AST, ALT, ALKPHOS, BILITOT, PROT, ALBUMIN in the last 168 hours. No results for input(s): LIPASE, AMYLASE in the last 168 hours. No results for input(s): AMMONIA in the last 168 hours. Coagulation Profile: No results for input(s): INR, PROTIME in the last 168 hours. Cardiac  Enzymes: No results for input(s): CKTOTAL, CKMB, CKMBINDEX, TROPONINI in the last 168 hours. BNP (last 3 results) No results for input(s): PROBNP in the last 8760 hours. HbA1C: No results for input(s): HGBA1C in the last 72 hours. CBG: Recent Labs  Lab 04/04/19 1642 04/04/19 2145 04/05/19 0814 04/05/19 1144 04/05/19 1607  GLUCAP 190* 192* 138* 246* 219*   Lipid Profile: No results for input(s): CHOL, HDL, LDLCALC, TRIG, CHOLHDL, LDLDIRECT in the last 72 hours. Thyroid Function Tests: No results for input(s): TSH, T4TOTAL, FREET4, T3FREE, THYROIDAB in the last 72 hours. Anemia Panel: No results for input(s): VITAMINB12, FOLATE, FERRITIN, TIBC, IRON, RETICCTPCT in the last 72 hours. Sepsis Labs: No results for input(s): PROCALCITON, LATICACIDVEN in the last 168 hours.  Recent Results (from the past 240 hour(s))  Blood Culture (routine x 2)     Status: None   Collection Time: 03/12/2019  9:00 AM   Specimen: BLOOD LEFT HAND  Result Value Ref Range Status   Specimen Description   Final    BLOOD LEFT HAND Performed at [facility], [place] [address] [place], [place] [address]  Special Requests   Final    BOTTLES DRAWN AEROBIC AND ANAEROBIC Blood Culture adequate volume Performed at [facility] [address] [place], [place] [address]    Culture   Final    NO GROWTH 5 DAYS Performed at [facility], [place] [address], [place], [place] [address]    Report Status 04/01/2019 FINAL  Final  SARS Coronavirus 2 [facility] order, Performed in [facility] hospital lab) Nasopharyngeal Nasopharyngeal Swab     Status: None   Collection Time: 03/05/2019  9:00 AM   Specimen: Nasopharyngeal Swab  Result Value Ref Range Status   SARS Coronavirus 2 NEGATIVE NEGATIVE Final    Comment: (NOTE) If result is NEGATIVE SARS-CoV-2 target nucleic acids are NOT DETECTED. The SARS-CoV-2 RNA is generally detectable in upper and lower  respiratory specimens  during the acute phase of infection. The lowest  concentration of SARS-CoV-2 viral copies this assay can detect is 250  copies / mL. A negative result does not preclude SARS-CoV-2 infection  and should not be used as the sole basis for treatment or other  patient management decisions.  A negative result may occur with  improper specimen collection / handling, submission of specimen other  than nasopharyngeal swab, presence of viral mutation(s) within the  areas targeted by this assay, and inadequate number of viral copies  (<250 copies / mL). A negative result must be combined with clinical  observations, patient history, and epidemiological information. If result is POSITIVE SARS-CoV-2 target nucleic acids are DETECTED. The SARS-CoV-2 RNA is generally detectable in upper and lower  respiratory specimens dur ing the acute phase of infection.  Positive  results are indicative of active infection with SARS-CoV-2.  Clinical  correlation with patient history and other diagnostic information is  necessary to determine patient infection status.  Positive results do  not rule out bacterial infection or co-infection with other viruses. If result is PRESUMPTIVE POSTIVE SARS-CoV-2 nucleic acids MAY BE PRESENT.   A presumptive positive result was obtained on the submitted specimen  and confirmed on repeat testing.  While 2019 novel coronavirus  (SARS-CoV-2) nucleic acids may be present in the submitted sample  additional confirmatory testing may be necessary for epidemiological  and / or clinical management purposes  to differentiate between  SARS-CoV-2 and other Sarbecovirus currently known to infect humans.  If clinically indicated additional testing with an alternate test  methodology [phone]) is advised. The SARS-CoV-2 RNA is generally  detectable in upper and lower respiratory sp ecimens during the acute  phase of infection. The expected result is Negative. Fact Sheet for Patients:   StrictlyIdeas.no Fact Sheet for Healthcare Providers: BankingDealers.co.za This test is not yet approved or cleared by the [place] FDA and has been authorized for detection and/or diagnosis of SARS-CoV-2 by FDA under an Emergency Use Authorization (EUA).  This EUA will remain in effect (meaning this test can be used) for the duration of the COVID-19 declaration under Section 564(b)(1) of the Act, 21 U.S.C. section 360bbb-3(b)(1), unless the authorization is terminated or revoked sooner. Performed at [facility], [place] [address], [place], [place] [address]   Blood Culture (routine x 2)     Status: None   Collection Time: 03/11/2019  2:13 PM   Specimen: BLOOD  Result Value Ref Range Status   Specimen Description   Final    BLOOD LEFT ARM Performed at [facility] [address], [place],  [address]    Special  Requests   Final    BOTTLES DRAWN AEROBIC ONLY Blood Culture adequate volume Performed at [facility] [address], [place], [place] [address]    Culture   Final    NO GROWTH 5 DAYS Performed at [facility], [place] [address], [place], [place] [address]    Report Status 04/01/2019 FINAL  Final  Culture, Urine     Status: Abnormal   Collection Time: 04/03/19  2:58 PM   Specimen: Urine, Catheterized  Result Value Ref Range Status   Specimen Description   Final    URINE, CATHETERIZED Performed at [facility] [address] [place], [place] [address]    Special Requests   Final    NONE Performed at [facility], [place] [address] [place],  [address]    Culture 80,000 COLONIES/mL YEAST (A)  Final   Report Status 04/04/2019 FINAL  Final     Radiology Studies: No results found.  Scheduled Meds: . aspirin EC  81 mg Oral Daily  . atorvastatin  40 mg Oral q1800  . carvedilol  25 mg Oral BID  . clopidogrel   75 mg Oral q1800  . fluticasone  1 spray Each Nare q1800  . heparin  5,000 Units Subcutaneous Q8H  . hydrALAZINE  25 mg Oral Q8H  . influenza vaccine adjuvanted  0.5 mL Intramuscular Tomorrow-1000  . insulin aspart  0-5 Units Subcutaneous QHS  . insulin aspart  0-9 Units Subcutaneous TID WC  . levothyroxine  50 mcg Oral Q0600  . loratadine  10 mg Oral Daily  . nystatin   Topical TID  . OLANZapine  15 mg Oral QHS  . sodium bicarbonate  650 mg Oral BID  . terazosin  5 mg Oral QHS   Continuous Infusions: . sodium chloride Stopped (04/05/19 1500)  . cefTRIAXone (ROCEPHIN)  IV 1 g (04/05/19 1200)     LOS: 9 days   Marylu Lund, MD [facility] Pager On Amion  If 7PM-7AM, please contact night-coverage 04/05/2019, 6:08 PM

## 2019-04-05 NOTE — Significant Event (Signed)
Rapid Response Event Note  Overview: Time Called: 2145 Arrival Time: 2150 Event Type: Neurologic, Respiratory  Initial Focused Assessment:  Admitted 8/31 with Respiratory Failure with Hypoxia  Medical Hx: DM, hypertension, hyperlipidemia, stage 4 CKD, schizoaffective disorder,  Hypothyroidism, stroke.  Admission History: Sent from [place] place for sob, wheezing and hypoxia, In ED, patient confused, but alert and slightly tachycardia, tachypneic, hypoxic on RA, and required 2L via [place] oxygen to keep sats greater than 90% labs were significant fr creatinine at 2.58, BUN of 40, bicarb of 16, potassium of 5.5, BNP of 1026, lactic acid of 1.1, wbc count of 14.3, hemoglobin of 9.9, CXR showed New bibasilar pulmonary infiltrates question pneumonia versus aspiration.  Rapid Response Event:  Called to bedside d/t nurse having concern d/t on her assessment patient had unequal pupils and tachypnea.  On my arrival patient resting in bed, Alert, Oriented, following commands.  Pupils on my assessment were equal but sluggish at 59mm.  NIH stroke scale done with no deficits noted other than some slight intermittent slurring of words.  VS-WNL, CBG also WNL.  Forrest Moron, NP notified and arrived at bedside as I was finishing up my assessment.  No rapid response orders given.  Advised to re notify rapid response @ [phone] if any signs of deterioration.  Event Summary: Name of Physician Notified: Forrest Moron, NP at 2100    at    Outcome: Stayed in room and stabalized  Event End Time: [place] ICU/SD [facility] / [place] / Rapid Response Nurse Rapid Response Number:  [phone] ICU Charge Nurse Number:  [phone]

## 2019-04-05 NOTE — Progress Notes (Signed)
[facility] will sign off.   Medication Recommendations: see yesterday's note, will need PRN vs low dose lasix based on status at discharge  Other recommendations (labs, testing, etc):  N/a Follow up as an outpatient:  Will arrange follow up with Dr. Harrell Gave

## 2019-04-06 DIAGNOSIS — R627 Adult failure to thrive: Secondary | ICD-10-CM

## 2019-04-06 LAB — BASIC METABOLIC PANEL
Anion gap: 10 (ref 5–15)
BUN: 62 mg/dL — ABNORMAL HIGH (ref 8–23)
CO2: 18 mmol/L — ABNORMAL LOW (ref 22–32)
Calcium: 7.5 mg/dL — ABNORMAL LOW (ref 8.9–10.3)
Chloride: 111 mmol/L (ref 98–111)
Creatinine, Ser: 3.25 mg/dL — ABNORMAL HIGH (ref 0.44–1.00)
GFR calc Af Amer: 15 mL/min — ABNORMAL LOW (ref >60)
GFR calc non Af Amer: 13 mL/min — ABNORMAL LOW (ref >60)
Glucose, Bld: 118 mg/dL — ABNORMAL HIGH (ref 70–99)
Potassium: 4.4 mmol/L (ref 3.5–5.1)
Sodium: 139 mmol/L (ref 135–145)

## 2019-04-06 LAB — CBC
HCT: 26.3 % — ABNORMAL LOW (ref 36.0–46.0)
Hemoglobin: 8.3 g/dL — ABNORMAL LOW (ref 12.0–15.0)
MCH: 30.5 pg (ref 26.0–34.0)
MCHC: 31.6 g/dL (ref 30.0–36.0)
MCV: 96.7 fL (ref 80.0–100.0)
Platelets: 212 10*3/uL (ref 150–400)
RBC: 2.72 MIL/uL — ABNORMAL LOW (ref 3.87–5.11)
RDW: 13.6 % (ref 11.5–15.5)
WBC: 15.5 10*3/uL — ABNORMAL HIGH (ref 4.0–10.5)
nRBC: 0 % (ref 0.0–0.2)

## 2019-04-06 LAB — GLUCOSE, CAPILLARY
Glucose-Capillary: 113 mg/dL — ABNORMAL HIGH (ref 70–99)
Glucose-Capillary: 115 mg/dL — ABNORMAL HIGH (ref 70–99)
Glucose-Capillary: 154 mg/dL — ABNORMAL HIGH (ref 70–99)
Glucose-Capillary: 146 mg/dL — ABNORMAL HIGH (ref 70–99)

## 2019-04-06 NOTE — Progress Notes (Signed)
   04/05/19 2128  Neurological  R Pupil Size (mm) 2  R Pupil Shape Round  R Pupil Reaction Sluggish  L Pupil Size (mm) 2  L Pupil Shape Round  L Pupil Reaction Sluggish   Rapid RN and Tylene Fantasia, NP Made aware. NIH scale completed by Jennye Moccasin, Rapid RN. NP assessed patient as well.  No new orders.

## 2019-04-06 NOTE — Progress Notes (Signed)
   04/05/19 2138  Rapid Response Notification  Name of Rapid Response RN Notified Jennye Moccasin,   Date Rapid Response Notified 04/05/19  Time Rapid Response Notified [id number]  Provider Notification  Provider Name/Title Tylene Fantasia, NP  Date Provider Notified 04/05/19  Time Provider Notified 2159  Notification Type Page  Notification Reason Change in status  Response Other (Comment) Tylene Fantasia, NP assessed the Pt. )  Date of Provider Response 04/05/19  Time of Provider Response 2210   Patient was presenting with intermittent expressive aphasia, and sluggish pupils. RN notified Rpaid response and NP. Both assessed the patient.   Orders Received: RN continue to monitor patient. If anything changes, notify NP and RRRN

## 2019-04-06 NOTE — Progress Notes (Signed)
PROGRESS NOTE    Ashley Velez  [id number] DOB: October 02, 1934 DOA: 03/05/2019 PCP: Crist Infante, MD    Brief Narrative:  84 year old female with history of diabetes mellitus type 2, hypertension, hyperlipidemia, stage IV CKD, schizoaffective disorder, hypothyroidism admitted on 03/13/2019 complains of shortness of breath.  Found to have non-STEMI and acute systolic CHF.  Cardiology was consulted.  Assessment & Plan:   Active Problems:   AKI (acute kidney injury) ([place])   Palliative care by specialist   DNR (do not resuscitate) discussion   Non-ST elevation (NSTEMI) myocardial infarction ([place])   Acute systolic heart failure ([place])   Chronic kidney disease (CKD), stage IV (severe) ([place])   Adult failure to thrive   1. Acute systolic CHF-EF 30 to [id number] on echocardiogram with hypokinesis of the left ventricle and apical area as well as dyskinesis of the left apical segment. Cardiology has signed off. Currently off diuretic  2. Tinea cruris/candidiasis-Pt was started on nystatin powder 3 times daily, Desitin cream.  3. ?  UTI-patient has abnormal UA, complains of dysuria.  Urine culture has been obtained.  Will start ceftriaxone 1 g IV daily.  WBC is elevated 22,000.  She has been afebrile.  WBC improved. Would cont abx. Repeat CBC in AM.  4. Non-STEMI-patient had significantly elevated high-sensitivity troponin 3824, TTE suggested anterior infarct. Patient noted cardiac cath candidate considering multiple medical issues.  Cardiology following.  Medical management recommended.  5. ?community-acquired pneumonia-patient was empirically started on ceftriaxone and Zithromax at the time of admission for possible CAP.  At this time, does not appear patient has infectious process.  PNA specific antibiotics were discontinued.  Solu-Medrol has been discontinued.  6. Acute kidney injury on CKD stage IV-patient baseline creatinine 1.5, creatinine is rising today is 3.85.  Palliative care was  consulted and family wants to continue with medical management for now.  Patient is not a good candidate for hemodialysis.  Nephrology consulted.  Started on IV normal saline per nephrology.  Creatinine is improving, today creatinine is 3.18.  We will continue with gentle IV hydration with 0.45 normal saline at 50 mill per hour for next 48 hours follow BMP in a.m. Nephrology has signed off.  7. Hypertension-blood pressures controlled, continue home medications. Stable  8. Type 2 diabetes mellitus-continue sliding scale insulin with NovoLog.  CBG well controlled at this time  9. Schizoaffective disorder-patient is on multiple psychotropic medications. Stable at this time  10. Hypothyroidism-continue Synthroid.  DVT prophylaxis: Heparin subq Code Status: DNR Family Communication: Pt in room, family not at bedside Disposition Plan: Uncertain at this time  Consultants:     Procedures:     Antimicrobials: Anti-infectives (From admission, onward)   Start     Dose/Rate Route Frequency Ordered Stop   04/04/19 1100  cefTRIAXone (ROCEPHIN) 1 g in sodium chloride 0.9 % 100 mL IVPB     1 g 200 mL/hr over 30 Minutes Intravenous Every 24 hours 04/04/19 1037     03/13/2019 0900  cefTRIAXone (ROCEPHIN) 2 g in sodium chloride 0.9 % 100 mL IVPB  Status:  Discontinued     2 g 200 mL/hr over 30 Minutes Intravenous Every 24 hours 03/11/2019 0859 03/30/19 0838   03/21/2019 0900  azithromycin (ZITHROMAX) 500 mg in sodium chloride 0.9 % 250 mL IVPB  Status:  Discontinued     500 mg 250 mL/hr over 60 Minutes Intravenous Every 24 hours 03/09/2019 0859 03/30/19 0838      Subjective: Without complaints this AM  Objective: Vitals:  04/06/19 0609 04/06/19 0609 04/06/19 0913 04/06/19 1352  BP: 135/62 135/62 136/71 137/68  Pulse:  81 90 78  Resp:  (!) 24 20 20   Temp:   98.4 F (36.9 C) 98.4 F (36.9 C)  TempSrc:   Oral Oral  SpO2:  94% 96% 96%  Weight:      Height:        Intake/Output Summary  (Last 24 hours) at 04/06/2019 1735 Last data filed at 04/06/2019 1354 Gross per 24 hour  Intake 1095.28 ml  Output 200 ml  Net 895.28 ml   Filed Weights   04/01/19 0435 04/03/19 0458 04/04/19 0505  Weight: 58.1 kg 56.9 kg 58.9 kg    Examination: General exam: Awake, laying in bed, in nad Respiratory system: Normal respiratory effort, no wheezing  Data Reviewed: I have personally reviewed following labs and imaging studies  CBC: Recent Labs  Lab 03/31/19 0832 04/03/19 1042 04/04/19 0419 04/05/19 0408 04/06/19 0436  WBC 13.7* 20.6* 22.7* 16.2* 15.5*  HGB 9.4* 10.9* 10.1* 8.5* 8.3*  HCT 29.4* 33.0* 31.3* 26.1* 26.3*  MCV 96.1 93.0 94.6 97.0 96.7  PLT 188 245 252 237 [id number]   Basic Metabolic Panel: Recent Labs  Lab 04/02/19 0549 04/03/19 1042 04/04/19 0419 04/05/19 0408 04/06/19 0436  NA 134* 137 137 136 139  K 4.5 4.6 4.7 4.3 4.4  CL 104 109 110 110 111  CO2 16* 18* 19* 20* 18*  GLUCOSE 130* 204* 169* 145* 118*  BUN 87* 79* 76* 69* 62*  CREATININE 3.34* 3.17* 3.18* 3.14* 3.25*  CALCIUM 7.7* 8.0* 8.2* 7.8* 7.5*   GFR: Estimated Creatinine Clearance: 11 mL/min (A) (by C-G formula based on SCr of 3.25 mg/dL (H)). Liver Function Tests: No results for input(s): AST, ALT, ALKPHOS, BILITOT, PROT, ALBUMIN in the last 168 hours. No results for input(s): LIPASE, AMYLASE in the last 168 hours. No results for input(s): AMMONIA in the last 168 hours. Coagulation Profile: No results for input(s): INR, PROTIME in the last 168 hours. Cardiac Enzymes: No results for input(s): CKTOTAL, CKMB, CKMBINDEX, TROPONINI in the last 168 hours. BNP (last 3 results) No results for input(s): PROBNP in the last 8760 hours. HbA1C: No results for input(s): HGBA1C in the last 72 hours. CBG: Recent Labs  Lab 04/05/19 2114 04/05/19 2148 04/06/19 0730 04/06/19 1112 04/06/19 1626  GLUCAP 111* 113* 115* 154* 146*   Lipid Profile: No results for input(s): CHOL, HDL, LDLCALC, TRIG, CHOLHDL,  LDLDIRECT in the last 72 hours. Thyroid Function Tests: No results for input(s): TSH, T4TOTAL, FREET4, T3FREE, THYROIDAB in the last 72 hours. Anemia Panel: No results for input(s): VITAMINB12, FOLATE, FERRITIN, TIBC, IRON, RETICCTPCT in the last 72 hours. Sepsis Labs: No results for input(s): PROCALCITON, LATICACIDVEN in the last 168 hours.  Recent Results (from the past 240 hour(s))  Culture, Urine     Status: Abnormal   Collection Time: 04/03/19  2:58 PM   Specimen: Urine, Catheterized  Result Value Ref Range Status   Specimen Description   Final    URINE, CATHETERIZED Performed at [facility] [address] [place], [place] [address]    Special Requests   Final    NONE Performed at [facility], [place] [address], [place], [place] [address]    Culture 80,000 COLONIES/mL YEAST (A)  Final   Report Status 04/04/2019 FINAL  Final     Radiology Studies: No results found.  Scheduled Meds: . aspirin EC  81 mg Oral Daily  .  atorvastatin  40 mg Oral q1800  . carvedilol  25 mg Oral BID  . clopidogrel  75 mg Oral q1800  . fluticasone  1 spray Each Nare q1800  . heparin  5,000 Units Subcutaneous Q8H  . hydrALAZINE  25 mg Oral Q8H  . influenza vaccine adjuvanted  0.5 mL Intramuscular Tomorrow-1000  . insulin aspart  0-5 Units Subcutaneous QHS  . insulin aspart  0-9 Units Subcutaneous TID WC  . levothyroxine  50 mcg Oral Q0600  . loratadine  10 mg Oral Daily  . nystatin   Topical TID  . OLANZapine  15 mg Oral QHS  . sodium bicarbonate  650 mg Oral BID  . terazosin  5 mg Oral QHS   Continuous Infusions: . cefTRIAXone (ROCEPHIN)  IV 1 g (04/06/19 1200)     LOS: 10 days   Marylu Lund, MD [facility] Pager On Amion  If 7PM-7AM, please contact night-coverage 04/06/2019, 5:35 PM

## 2019-04-06 NOTE — Progress Notes (Signed)
RN paged NP and RRRN earlier in the shift due to pt ? having aphasia and sluggish pupils. NP to bedside.  S: pt states she is in no pain and is not having SOB, HA, n/v, dizziness.  O: well appearing elderly female in NAD. VS reviewed-stable. Alert, oriented. PERRL. Speech is fluent and appropriate. No aphasia or dysarthria. Follows commands. Tongue is midline and strength is normal. Smile is symmetrical. Strength of extremities is symmetrical. NIH score normal. MOE x 4. Skin is warm, dry.  A/P: 1. ? Stroke sx-NP sees no focal signs of a stroke. Continue to monitor.  KJKG, NP [facility]

## 2019-04-06 NOTE — Plan of Care (Addendum)
  Problem: Clinical Measurements: Goal: Cardiovascular complication will be avoided Outcome: Progressing   Problem: Elimination: Goal: Will not experience complications related to bowel motility Outcome: Progressing Goal: Will not experience complications related to urinary retention Outcome: Progressing

## 2019-04-07 ENCOUNTER — Inpatient Hospital Stay (HOSPITAL_COMMUNITY): Payer: Medicare Other

## 2019-04-07 DIAGNOSIS — M7989 Other specified soft tissue disorders: Secondary | ICD-10-CM

## 2019-04-07 LAB — CBC
HCT: 26.7 % — ABNORMAL LOW (ref 36.0–46.0)
Hemoglobin: 8.2 g/dL — ABNORMAL LOW (ref 12.0–15.0)
MCH: 30.4 pg (ref 26.0–34.0)
MCHC: 30.7 g/dL (ref 30.0–36.0)
MCV: 98.9 fL (ref 80.0–100.0)
Platelets: 204 10*3/uL (ref 150–400)
RBC: 2.7 MIL/uL — ABNORMAL LOW (ref 3.87–5.11)
RDW: 13.5 % (ref 11.5–15.5)
WBC: 14.6 10*3/uL — ABNORMAL HIGH (ref 4.0–10.5)
nRBC: 0 % (ref 0.0–0.2)

## 2019-04-07 LAB — BASIC METABOLIC PANEL
Anion gap: 5 (ref 5–15)
BUN: 57 mg/dL — ABNORMAL HIGH (ref 8–23)
CO2: 22 mmol/L (ref 22–32)
Calcium: 8 mg/dL — ABNORMAL LOW (ref 8.9–10.3)
Chloride: 109 mmol/L (ref 98–111)
Creatinine, Ser: 3.13 mg/dL — ABNORMAL HIGH (ref 0.44–1.00)
GFR calc Af Amer: 15 mL/min — ABNORMAL LOW (ref >60)
GFR calc non Af Amer: 13 mL/min — ABNORMAL LOW (ref >60)
Glucose, Bld: 147 mg/dL — ABNORMAL HIGH (ref 70–99)
Potassium: 4.5 mmol/L (ref 3.5–5.1)
Sodium: 136 mmol/L (ref 135–145)

## 2019-04-07 LAB — GLUCOSE, CAPILLARY
Glucose-Capillary: 218 mg/dL — ABNORMAL HIGH (ref 70–99)
Glucose-Capillary: 144 mg/dL — ABNORMAL HIGH (ref 70–99)
Glucose-Capillary: 219 mg/dL — ABNORMAL HIGH (ref 70–99)
Glucose-Capillary: 150 mg/dL — ABNORMAL HIGH (ref 70–99)
Glucose-Capillary: 176 mg/dL — ABNORMAL HIGH (ref 70–99)

## 2019-04-07 LAB — APTT: aPTT: 37 seconds — ABNORMAL HIGH (ref 24–36)

## 2019-04-07 LAB — PROTIME-INR
INR: 1.1 (ref 0.8–1.2)
Prothrombin Time: 13.8 seconds (ref 11.4–15.2)

## 2019-04-07 LAB — SARS CORONAVIRUS 2 (TAT 6-24 HRS): SARS Coronavirus 2: NEGATIVE

## 2019-04-07 MED ORDER — WARFARIN - PHARMACIST DOSING INPATIENT: Freq: Every day | Status: DC

## 2019-04-07 MED ORDER — HEPARIN (PORCINE) 25000 UT/250ML-% IV SOLN
500.0000 [IU]/h | INTRAVENOUS | Status: DC
  Administered 2019-04-07: 17:00:00 600 [IU]/h via INTRAVENOUS
  Filled 2019-04-07: qty 250

## 2019-04-07 MED ORDER — HEPARIN BOLUS VIA INFUSION
1000.0000 [IU] | Freq: Once | INTRAVENOUS | Status: AC
  Administered 2019-04-07: 17:00:00 1000 [IU] via INTRAVENOUS
  Filled 2019-04-07: qty 1000

## 2019-04-07 MED ORDER — WARFARIN SODIUM 4 MG PO TABS
4.0000 mg | ORAL_TABLET | Freq: Once | ORAL | Status: AC
  Administered 2019-04-07: 4 mg via ORAL
  Filled 2019-04-07: qty 1

## 2019-04-07 NOTE — Progress Notes (Signed)
Right upper extremity venous duplex completed. Preliminary results in Chart review CV Proc.  [place] 04/07/2019, 2:51PM

## 2019-04-07 NOTE — Progress Notes (Signed)
PROGRESS NOTE    Ashley Velez  [id number] DOB: 09-11-1934 DOA: 03/06/2019 PCP: Crist Infante, MD    Brief Narrative:  84 year old female with history of diabetes mellitus type 2, hypertension, hyperlipidemia, stage IV CKD, schizoaffective disorder, hypothyroidism admitted on 02/27/2019 complains of shortness of breath.  Found to have non-STEMI and acute systolic CHF.  Cardiology was consulted.  Assessment & Plan:   Active Problems:   AKI (acute kidney injury) ([place])   Palliative care by specialist   DNR (do not resuscitate) discussion   Non-ST elevation (NSTEMI) myocardial infarction ([place])   Acute systolic heart failure ([place])   Chronic kidney disease (CKD), stage IV (severe) ([place])   Adult failure to thrive   1. Acute systolic CHF-EF 30 to [id number] on echocardiogram with hypokinesis of the left ventricle and apical area as well as dyskinesis of the left apical segment. Cardiology has signed off. Off diuretics  2. Tinea cruris/candidiasis-Pt was started on nystatin powder 3 times daily, Desitin cream. Currently stable  3. ?  UTI-patient has abnormal UA, complains of dysuria.  Urine culture has been obtained.  Will start ceftriaxone 1 g IV daily.  WBC is elevated 22,000.  She has been afebrile.  WBC improved. Would cont abx. Recheck CBC in AM  4. Non-STEMI-patient had significantly elevated high-sensitivity troponin 3824, TTE suggested anterior infarct. Patient noted cardiac cath candidate considering multiple medical issues.  Cardiology had been following.  Medical management recommended.  5. ?community-acquired pneumonia-patient was empirically started on ceftriaxone and Zithromax at the time of admission for possible CAP.  At this time, does not appear patient has infectious process.  PNA specific antibiotics were discontinued.  Solu-Medrol has been discontinued. Seems stable  6. Acute kidney injury on CKD stage IV-patient baseline creatinine 1.5, creatinine is rising today  is 3.85.  Palliative care was consulted and family wants to continue with medical management for now.  Patient is not a good candidate for hemodialysis.  Nephrology consulted.  Started on IV normal saline per nephrology.  Creatinine is improving, today creatinine is 3.18.  We will continue with gentle IV hydration with 0.45 normal saline at 50 mill per hour for next 48 hours follow BMP in a.m. Nephrology has since signed off.  7. Hypertension-blood pressures controlled, continue home medications. Stable  8. Type 2 diabetes mellitus-continue sliding scale insulin with NovoLog.  CBG well controlled currently  9. Schizoaffective disorder-patient is on multiple psychotropic medications. Remains stable at this time  10. Hypothyroidism-continue Synthroid.  11. RUE DVT - New finding. UE dopplers ordered and reviewed. New RUE DVT noted. Given renal function, will start coumadin with heparin bridge. Discussed with pharmacy  DVT prophylaxis: Heparin subq Code Status: DNR Family Communication: Pt in room, family not at bedside Disposition Plan: Uncertain at this time  Consultants:   Cardiolgy  Procedures:     Antimicrobials: Anti-infectives (From admission, onward)   Start     Dose/Rate Route Frequency Ordered Stop   04/04/19 1100  cefTRIAXone (ROCEPHIN) 1 g in sodium chloride 0.9 % 100 mL IVPB     1 g 200 mL/hr over 30 Minutes Intravenous Every 24 hours 04/04/19 1037     02/27/2019 0900  cefTRIAXone (ROCEPHIN) 2 g in sodium chloride 0.9 % 100 mL IVPB  Status:  Discontinued     2 g 200 mL/hr over 30 Minutes Intravenous Every 24 hours 03/26/2019 0859 03/30/19 0838   03/14/2019 0900  azithromycin (ZITHROMAX) 500 mg in sodium chloride 0.9 % 250 mL IVPB  Status:  Discontinued     500 mg 250 mL/hr over 60 Minutes Intravenous Every 24 hours 03/20/2019 0859 03/30/19 0838      Subjective: No complaints  Objective: Vitals:   04/06/19 2158 04/07/19 0519 04/07/19 1054 04/07/19 1258  BP: (!)  146/78 (!) 143/68 134/65 118/64  Pulse: 77 78 96 90  Resp:   16 (!) 22  Temp: 98.6 F (37 C) 98.5 F (36.9 C)  98.9 F (37.2 C)  TempSrc: Oral Oral  Oral  SpO2: 98% 97% 97% 94%  Weight:      Height:        Intake/Output Summary (Last 24 hours) at 04/07/2019 1620 Last data filed at 04/07/2019 0915 Gross per 24 hour  Intake 160 ml  Output 400 ml  Net -240 ml   Filed Weights   04/01/19 0435 04/03/19 0458 04/04/19 0505  Weight: 58.1 kg 56.9 kg 58.9 kg    Examination: General exam: Conversant, in no acute distress Respiratory system: normal chest rise, clear, no audible wheezing Cardiovascular system: regular rhythm, s1-s2 Gastrointestinal system: Nondistended, nontender, pos BS Central nervous system: No seizures, no tremors Extremities: No cyanosis, no joint deformities, R arm swelling Skin: No rashes, no pallor Psychiatry: Affect normal // no auditory hallucinations   Data Reviewed: I have personally reviewed following labs and imaging studies  CBC: Recent Labs  Lab 04/03/19 1042 04/04/19 0419 04/05/19 0408 04/06/19 0436 04/07/19 0437  WBC 20.6* 22.7* 16.2* 15.5* 14.6*  HGB 10.9* 10.1* 8.5* 8.3* 8.2*  HCT 33.0* 31.3* 26.1* 26.3* 26.7*  MCV 93.0 94.6 97.0 96.7 98.9  PLT 245 252 237 212 0000000   Basic Metabolic Panel: Recent Labs  Lab 04/03/19 1042 04/04/19 0419 04/05/19 0408 04/06/19 0436 04/07/19 0437  NA 137 137 136 139 136  K 4.6 4.7 4.3 4.4 4.5  CL 109 110 110 111 109  CO2 18* 19* 20* 18* 22  GLUCOSE 204* 169* 145* 118* 147*  BUN 79* 76* 69* 62* 57*  CREATININE 3.17* 3.18* 3.14* 3.25* 3.13*  CALCIUM 8.0* 8.2* 7.8* 7.5* 8.0*   GFR: Estimated Creatinine Clearance: 11.5 mL/min (A) (by C-G formula based on SCr of 3.13 mg/dL (H)). Liver Function Tests: No results for input(s): AST, ALT, ALKPHOS, BILITOT, PROT, ALBUMIN in the last 168 hours. No results for input(s): LIPASE, AMYLASE in the last 168 hours. No results for input(s): AMMONIA in the last 168  hours. Coagulation Profile: No results for input(s): INR, PROTIME in the last 168 hours. Cardiac Enzymes: No results for input(s): CKTOTAL, CKMB, CKMBINDEX, TROPONINI in the last 168 hours. BNP (last 3 results) No results for input(s): PROBNP in the last 8760 hours. HbA1C: No results for input(s): HGBA1C in the last 72 hours. CBG: Recent Labs  Lab 04/06/19 1112 04/06/19 1626 04/06/19 2159 04/07/19 0804 04/07/19 1149  GLUCAP 154* 146* 218* 144* 219*   Lipid Profile: No results for input(s): CHOL, HDL, LDLCALC, TRIG, CHOLHDL, LDLDIRECT in the last 72 hours. Thyroid Function Tests: No results for input(s): TSH, T4TOTAL, FREET4, T3FREE, THYROIDAB in the last 72 hours. Anemia Panel: No results for input(s): VITAMINB12, FOLATE, FERRITIN, TIBC, IRON, RETICCTPCT in the last 72 hours. Sepsis Labs: No results for input(s): PROCALCITON, LATICACIDVEN in the last 168 hours.  Recent Results (from the past 240 hour(s))  Culture, Urine     Status: Abnormal   Collection Time: 04/03/19  2:58 PM   Specimen: Urine, Catheterized  Result Value Ref Range Status   Specimen Description   Final    URINE,  CATHETERIZED Performed at [facility] [address], [place], [place] [address]    Special Requests   Final    NONE Performed at [facility], [place] [address], [place], [place] [address]    Culture 80,000 COLONIES/mL YEAST (A)  Final   Report Status 04/04/2019 FINAL  Final  SARS CORONAVIRUS 2 (TAT 6-24 HRS) Nasopharyngeal Nasopharyngeal Swab     Status: None   Collection Time: 04/06/19  8:33 PM   Specimen: Nasopharyngeal Swab  Result Value Ref Range Status   SARS Coronavirus 2 NEGATIVE NEGATIVE Final    Comment: (NOTE) SARS-CoV-2 target nucleic acids are NOT DETECTED. The SARS-CoV-2 RNA is generally detectable in upper and lower respiratory specimens during the acute phase of infection. Negative results do not preclude SARS-CoV-2 infection, do not  rule out co-infections with other pathogens, and should not be used as the sole basis for treatment or other patient management decisions. Negative results must be combined with clinical observations, patient history, and epidemiological information. The expected result is Negative. Fact Sheet for Patients: SugarRoll.be Fact Sheet for Healthcare Providers: https://www.woods-mathews.com/ This test is not yet approved or cleared by the [place] FDA and  has been authorized for detection and/or diagnosis of SARS-CoV-2 by FDA under an Emergency Use Authorization (EUA). This EUA will remain  in effect (meaning this test can be used) for the duration of the COVID-19 declaration under Section 56 4(b)(1) of the Act, 21 U.S.C. section 360bbb-3(b)(1), unless the authorization is terminated or revoked sooner. Performed at [facility], [place] [address], [place],  [address]      Radiology Studies: Vas [place] Upper Extremity Venous Duplex  Result Date: 04/07/2019 UPPER VENOUS STUDY  Indications: Pain, Swelling, and SOB Risk Factors: Type 2 DM, Stage IV CKD, and non STEMI and acute systolic CHF. Limitations: IV placement in the distal to mid upper arm and dressing. Comparison Study: No previous exam available Performing Technologist: Toma Copier RVS  Examination Guidelines: A complete evaluation includes B-mode imaging, spectral Doppler, color Doppler, and power Doppler as needed of all accessible portions of each vessel. Bilateral testing is considered an integral part of a complete examination. Limited examinations for reoccurring indications may be performed as noted.  Right Findings: +----------+------------+---------+-----------+----------+---------------------+  RIGHT      Compressible Phasicity Spontaneous Properties        Summary         +----------+------------+---------+-----------+----------+---------------------+  IJV            Full         Yes        Yes                                       +----------+------------+---------+-----------+----------+---------------------+  Subclavian     Full        Yes        Yes                                       +----------+------------+---------+-----------+----------+---------------------+  Axillary       Full        Yes        Yes                                       +----------+------------+---------+-----------+----------+---------------------+  Brachial     Partial                                       Acute, Unable to                                                               visualize the distal                                                               region due to IV                                                                     dessing.         +----------+------------+---------+-----------+----------+---------------------+  Radial         Full                                                             +----------+------------+---------+-----------+----------+---------------------+  Ulnar          Full                                                             +----------+------------+---------+-----------+----------+---------------------+  Cephalic       Full                                                             +----------+------------+---------+-----------+----------+---------------------+  Basilic        Full                                                             +----------+------------+---------+-----------+----------+---------------------+  Left Findings: +----------+------------+---------+-----------+----------+---------------------+  LEFT       Compressible Phasicity Spontaneous Properties        Summary         +----------+------------+---------+-----------+----------+---------------------+  IJV            Full        Yes  Yes                                       +----------+------------+---------+-----------+----------+---------------------+   Subclavian                 Yes        Yes                 Unable to compress                                                               due to the clavicle   +----------+------------+---------+-----------+----------+---------------------+  Summary:  Right: No evidence of superficial vein thrombosis in the upper extremity. No evidence of thrombosis in the subclavian. Findings consistent with acute deep vein thrombosis involving the right brachial veins.  Left: No evidence of thrombosis in the subclavian.  *See table(s) above for measurements and observations.     Preliminary     Scheduled Meds:  aspirin EC  81 mg Oral Daily   atorvastatin  40 mg Oral q1800   carvedilol  25 mg Oral BID   clopidogrel  75 mg Oral q1800   fluticasone  1 spray Each Nare q1800   heparin  1,000 Units Intravenous Once   hydrALAZINE  25 mg Oral Q8H   influenza vaccine adjuvanted  0.5 mL Intramuscular Tomorrow-1000   insulin aspart  0-5 Units Subcutaneous QHS   insulin aspart  0-9 Units Subcutaneous TID WC   levothyroxine  50 mcg Oral Q0600   loratadine  10 mg Oral Daily   nystatin   Topical TID   OLANZapine  15 mg Oral QHS   sodium bicarbonate  650 mg Oral BID   terazosin  5 mg Oral QHS   Continuous Infusions:  cefTRIAXone (ROCEPHIN)  IV 1 g (04/07/19 1053)   heparin       LOS: 11 days   Marylu Lund, MD [facility] Pager On Amion  If 7PM-7AM, please contact night-coverage 04/07/2019, 4:20 PM

## 2019-04-07 NOTE — Progress Notes (Signed)
ANTICOAGULATION CONSULT NOTE - Initial Consult  Pharmacy Consult for heparin and warfarin Indication: DVT  Allergies  Allergen Reactions  . Fosamax [Alendronate] Other (See Comments)    Noted to be an allergy on the patient's paperwork from facility, but no reaction is recorded  . Macrodantin [Nitrofurantoin] Other (See Comments)    Noted to be an allergy on the patient's paperwork from facility, but no reaction is recorded    Patient Measurements: Height: 5\' 3"  (160 cm) Weight: 129 lb 14.4 oz (58.9 kg) IBW/kg (Calculated) : 52.4 Heparin Dosing Weight = TBW = 59 kg  Vital Signs: Temp: 98.9 F (37.2 C) (09/11 1258) Temp Source: Oral (09/11 1258) BP: 118/64 (09/11 1258) Pulse Rate: 90 (09/11 1258)  Labs: Recent Labs    04/05/19 0408 04/06/19 0436 04/07/19 0437  HGB 8.5* 8.3* 8.2*  HCT 26.1* 26.3* 26.7*  PLT 237 212 204  CREATININE 3.14* 3.25* 3.13*    Estimated Creatinine Clearance: 11.5 mL/min (A) (by C-G formula based on SCr of 3.13 mg/dL (H)).   Medical History: Past Medical History:  Diagnosis Date  . Acute respiratory failure ([place]) 09/2016  . Diabetes mellitus without complication ([place])   . Epilepsy ([place])   . Hyperlipidemia   . Hypertension   . Renal disorder    Stage 4 CKD  . Schizoaffective disorder ([place])   . Stroke ([place])   . Thyroid disease    hypothyroid  . Vitamin D deficiency     Assessment: Pharmacy consulted to dose/monitor warfarin with a heparin bridge in this 84 year old female with newly diagnosed DVT in right brachial veins. Pt was not on anticoagulation PTA. Patient currently on HSQ 5000 units TID for DVT ppx and was previously on heparin infusion during admission for ACS.   Today, 04/07/19  Hgb 8.2 - low but stable  Plt 204 - WNL, stable  Last dose of Heparin subcutaneous given at 1435 on 9/11  SCr = 3.1, CrCL ~11 mL/min  INR = 1.1 at baseline  Patient has carb modified diet ordered, charted eating 25% of meals  No  significant DDI that would induce/inhibit warfarin, however, noted that patient is currently prescribed clopidogrel 75 mg PO daily and aspirin 81 mg PO daily.    Goal of Therapy:  INR 2-3 Heparin level 0.3-0.7 units/ml Monitor platelets by anticoagulation protocol: Yes   Plan:   Discontinue HSQ  Heparin bolus of 1000 units (reduced bolus given recent administration of subcutaneous heparin dose)  Heparin infusion 600 units/hr as patient was previously therapeutic on that rate earlier in admission  Check 8 hour HL  HL and CBC daily while on heparin infusion  INR daily  Warfarin 4 mg PO once  @ 1800 this evening  Due to patient's renal function, LMWH is not an option for bridge therapy. Patient will need to be bridged with IV heparin for a minimum of 5 days and until INR is therapeutic x2 consecutive days.  Lenis Noon, PharmD 04/07/2019,4:03 PM

## 2019-04-07 NOTE — Care Management Important Message (Signed)
Important Message  Patient Details IM Letter given to Rhea Pink SW to present to the Patient Name: Ashley Velez MRN: [id number] Date of Birth: August 07, 1934   Medicare Important Message Given:  Yes     Kerin Salen 04/07/2019, 11:15 AM

## 2019-04-07 NOTE — TOC Progression Note (Signed)
Transition of Care [facility]) - Progression Note    Patient Details  Name: Azalee Ponds MRN: [id number] Date of Birth: 06/04/1935  Transition of Care [facility]) CM/SW [place], LCSW Phone Number: 04/07/2019, 11:18 AM  Clinical Narrative:   CSW following patient for support and discharge needs. Patient has long term bed at [facility] and will return once medically ready      Expected Discharge Plan: [place] Barriers to Discharge: Continued Medical Work up  Expected Discharge Plan and Services Expected Discharge Plan: [place]   Discharge Planning Services: CM Consult   Living arrangements for the past 2 months: Post-Acute Facility Expected Discharge Date: (unknown)                                     Social Determinants of Health (SDOH) Interventions    Readmission Risk Interventions Readmission Risk Prevention Plan 04/04/2019  Transportation Screening Complete  PCP or Specialist Appt within 3-5 Days Complete  HRI or [place] Not Complete  HRI or Home Care Consult comments pt from SNF  Social Work Consult for [place] Planning/Counseling Complete  Palliative Care Screening Not Applicable  Medication Review Press photographer) Complete  Some recent data might be hidden

## 2019-04-08 LAB — GLUCOSE, CAPILLARY
Glucose-Capillary: 136 mg/dL — ABNORMAL HIGH (ref 70–99)
Glucose-Capillary: 100 mg/dL — ABNORMAL HIGH (ref 70–99)
Glucose-Capillary: 195 mg/dL — ABNORMAL HIGH (ref 70–99)

## 2019-04-08 LAB — HEPARIN LEVEL (UNFRACTIONATED)
Heparin Unfractionated: 0.89 IU/mL — ABNORMAL HIGH (ref 0.30–0.70)
Heparin Unfractionated: 0.79 IU/mL — ABNORMAL HIGH (ref 0.30–0.70)
Heparin Unfractionated: 0.27 IU/mL — ABNORMAL LOW (ref 0.30–0.70)

## 2019-04-08 LAB — PROTIME-INR
INR: 1.2 (ref 0.8–1.2)
Prothrombin Time: 15 seconds (ref 11.4–15.2)

## 2019-04-08 LAB — CBC
HCT: 25 % — ABNORMAL LOW (ref 36.0–46.0)
Hemoglobin: 8.2 g/dL — ABNORMAL LOW (ref 12.0–15.0)
MCH: 31.8 pg (ref 26.0–34.0)
MCHC: 32.8 g/dL (ref 30.0–36.0)
MCV: 96.9 fL (ref 80.0–100.0)
Platelets: 167 10*3/uL (ref 150–400)
RBC: 2.58 MIL/uL — ABNORMAL LOW (ref 3.87–5.11)
RDW: 13.8 % (ref 11.5–15.5)
WBC: 14.8 10*3/uL — ABNORMAL HIGH (ref 4.0–10.5)
nRBC: 0 % (ref 0.0–0.2)

## 2019-04-08 LAB — BASIC METABOLIC PANEL
Anion gap: 7 (ref 5–15)
BUN: 50 mg/dL — ABNORMAL HIGH (ref 8–23)
CO2: 20 mmol/L — ABNORMAL LOW (ref 22–32)
Calcium: 8.1 mg/dL — ABNORMAL LOW (ref 8.9–10.3)
Chloride: 109 mmol/L (ref 98–111)
Creatinine, Ser: 3.13 mg/dL — ABNORMAL HIGH (ref 0.44–1.00)
GFR calc Af Amer: 15 mL/min — ABNORMAL LOW (ref >60)
GFR calc non Af Amer: 13 mL/min — ABNORMAL LOW (ref >60)
Glucose, Bld: 149 mg/dL — ABNORMAL HIGH (ref 70–99)
Potassium: 4.8 mmol/L (ref 3.5–5.1)
Sodium: 136 mmol/L (ref 135–145)

## 2019-04-08 MED ORDER — HEPARIN (PORCINE) 25000 UT/250ML-% IV SOLN
450.0000 [IU]/h | INTRAVENOUS | Status: DC
  Administered 2019-04-08: 13:00:00 400 [IU]/h via INTRAVENOUS

## 2019-04-08 MED ORDER — INFLUENZA VAC A&B SA ADJ QUAD 0.5 ML IM PRSY
0.5000 mL | PREFILLED_SYRINGE | Freq: Once | INTRAMUSCULAR | Status: AC
  Administered 2019-04-08: 0.5 mL via INTRAMUSCULAR
  Filled 2019-04-08 (×2): qty 0.5

## 2019-04-08 MED ORDER — WARFARIN SODIUM 2.5 MG PO TABS
2.5000 mg | ORAL_TABLET | Freq: Once | ORAL | Status: AC
  Administered 2019-04-08: 2.5 mg via ORAL
  Filled 2019-04-08: qty 1

## 2019-04-08 NOTE — Progress Notes (Signed)
ANTICOAGULATION CONSULT NOTE - Follow Up Consult  Pharmacy Consult for Heparin Indication: DVT  Allergies  Allergen Reactions  . Fosamax [Alendronate] Other (See Comments)    Noted to be an allergy on the patient's paperwork from facility, but no reaction is recorded  . Macrodantin [Nitrofurantoin] Other (See Comments)    Noted to be an allergy on the patient's paperwork from facility, but no reaction is recorded    Patient Measurements: Height: 5\' 3"  (160 cm) Weight: 129 lb 14.4 oz (58.9 kg) IBW/kg (Calculated) : 52.4 Heparin Dosing Weight:   Vital Signs: Temp: 98.4 F (36.9 C) (09/11 2001) Temp Source: Oral (09/11 2001) BP: 131/69 (09/11 2001) Pulse Rate: 79 (09/11 2001)  Labs: Recent Labs    04/06/19 0436 04/07/19 0437 04/07/19 1555 04/08/19 0043  HGB 8.3* 8.2*  --  8.2*  HCT 26.3* 26.7*  --  25.0*  PLT 212 204  --  167  APTT  --   --  37*  --   LABPROT  --   --  13.8 15.0  INR  --   --  1.1 1.2  HEPARINUNFRC  --   --   --  0.89*  CREATININE 3.25* 3.13*  --  3.13*    Estimated Creatinine Clearance: 11.5 mL/min (A) (by C-G formula based on SCr of 3.13 mg/dL (H)).   Medications:  Infusions:  . cefTRIAXone (ROCEPHIN)  IV Stopped (04/07/19 1123)  . heparin 600 Units/hr (04/08/19 0100)    Assessment: Patient with high heparin level.  No heparin issues per RN.  Goal of Therapy:  Heparin level 0.3-0.7 units/ml Monitor platelets by anticoagulation protocol: Yes   Plan:  Decrease heparin to 500 units/hr Recheck level at [address], [place] 04/08/2019,1:33 AM

## 2019-04-08 NOTE — Progress Notes (Signed)
PROGRESS NOTE    Ashley Velez  [id number] DOB: 21-Feb-1935 DOA: 03/13/2019 PCP: Crist Infante, MD    Brief Narrative:  84 year old female with history of diabetes mellitus type 2, hypertension, hyperlipidemia, stage IV CKD, schizoaffective disorder, hypothyroidism admitted on 03/11/2019 complains of shortness of breath.  Found to have non-STEMI and acute systolic CHF.  Cardiology was consulted.  Assessment & Plan:   Active Problems:   AKI (acute kidney injury) ([place])   Palliative care by specialist   DNR (do not resuscitate) discussion   Non-ST elevation (NSTEMI) myocardial infarction ([place])   Acute systolic heart failure ([employer])   Chronic kidney disease (CKD), stage IV (severe) ([place])   Adult failure to thrive   1. Acute systolic CHF-EF 30 to [id number] on echocardiogram with hypokinesis of the left ventricle and apical area as well as dyskinesis of the left apical segment. Pt now off diuretics and cardiology since signed off  2. Tinea cruris/candidiasis-Pt was started on nystatin powder 3 times daily, Desitin cream. Presently stable at this time  3. ?  UTI-patient has abnormal UA, complains of dysuria.  Urine culture has been obtained.  Will start ceftriaxone 1 g IV daily.  WBC peaked to over 22,000.  She has been afebrile.  WBC now improving, currently 14.6   4. Non-STEMI-patient had significantly elevated high-sensitivity troponin 3824, TTE suggested anterior infarct. Patient noted cardiac cath candidate considering multiple medical issues.  Cardiology had been following.  Medical management recommended with ASA, statin, and BB  5. ?community-acquired pneumonia-patient was empirically started on ceftriaxone and Zithromax at the time of admission for possible CAP.  At this time, does not appear patient has infectious process.  PNA specific antibiotics were discontinued.  Solu-Medrol has been discontinued. Presently stable on minimal O2 support  6. Acute kidney injury on CKD stage  IV-patient baseline creatinine 1.5, creatinine is rising today is 3.85.  Palliative care was consulted and family wants to continue with medical management for now.  Patient is not a good candidate for hemodialysis.  Nephrology was consulted and pt was continued on gentle IVF with improving renal function. Renal US reviewed, findings suggesting advanced renal disease. Repeat bmet in AM  7. Hypertension-blood pressures controlled, continue home medications. Remains stable at this time  8. Type 2 diabetes mellitus-continue sliding scale insulin with NovoLog.  CBG remains stable at this time  9. Schizoaffective disorder-patient is on multiple psychotropic medications. Currently stable  10. Hypothyroidism-continue Synthroid.  11. RUE DVT - New finding this admission. UE dopplers with new RUE DVT noted. Given renal function, have continued coumadin with heparin bridge, pharmacy to dose. Current INR 1.2  DVT prophylaxis: Heparin bridge with coumadin Code Status: DNR Family Communication: Pt in room, family not at bedside Disposition Plan: SNF when INR therapeutic  Consultants:   Cardiology  Nephrology  Procedures:     Antimicrobials: Anti-infectives (From admission, onward)   Start     Dose/Rate Route Frequency Ordered Stop   04/04/19 1100  cefTRIAXone (ROCEPHIN) 1 g in sodium chloride 0.9 % 100 mL IVPB     1 g 200 mL/hr over 30 Minutes Intravenous Every 24 hours 04/04/19 1037     03/16/2019 0900  cefTRIAXone (ROCEPHIN) 2 g in sodium chloride 0.9 % 100 mL IVPB  Status:  Discontinued     2 g 200 mL/hr over 30 Minutes Intravenous Every 24 hours 03/12/2019 0859 03/30/19 0838   03/01/2019 0900  azithromycin (ZITHROMAX) 500 mg in sodium chloride 0.9 % 250 mL IVPB  Status:  Discontinued     500 mg 250 mL/hr over 60 Minutes Intravenous Every 24 hours 03/26/2019 0859 03/30/19 0838      Subjective: Eating lunch, no complaints  Objective: Vitals:   04/07/19 1258 04/07/19 2001 04/08/19  0431 04/08/19 1339  BP: 118/64 131/69 137/67 136/69  Pulse: 90 79 78 97  Resp: (!) 22 (!) 22 20 (!) 22  Temp: 98.9 F (37.2 C) 98.4 F (36.9 C) 98.5 F (36.9 C) 98.7 F (37.1 C)  TempSrc: Oral Oral Oral Oral  SpO2: 94% 97% 97% 96%  Weight:   57.4 kg   Height:        Intake/Output Summary (Last 24 hours) at 04/08/2019 1553 Last data filed at 04/08/2019 1100 Gross per 24 hour  Intake 528.32 ml  Output 1100 ml  Net -571.68 ml   Filed Weights   04/03/19 0458 04/04/19 0505 04/08/19 0431  Weight: 56.9 kg 58.9 kg 57.4 kg    Examination: General exam: Awake, laying in bed, in nad Respiratory system: Normal respiratory effort, no wheezing Cardiovascular system: regular rate, s1, s2 Gastrointestinal system: Soft, nondistended, positive BS Central nervous system: CN2-12 grossly intact, strength intact Extremities: Perfused, no clubbing Skin: Normal skin turgor, no notable skin lesions seen Psychiatry: Mood normal // no visual hallucinations   Data Reviewed: I have personally reviewed following labs and imaging studies  CBC: Recent Labs  Lab 04/04/19 0419 04/05/19 0408 04/06/19 0436 04/07/19 0437 04/08/19 0043  WBC 22.7* 16.2* 15.5* 14.6* 14.8*  HGB 10.1* 8.5* 8.3* 8.2* 8.2*  HCT 31.3* 26.1* 26.3* 26.7* 25.0*  MCV 94.6 97.0 96.7 98.9 96.9  PLT 252 237 212 204 [id number]   Basic Metabolic Panel: Recent Labs  Lab 04/04/19 0419 04/05/19 0408 04/06/19 0436 04/07/19 0437 04/08/19 0043  NA 137 136 139 136 136  K 4.7 4.3 4.4 4.5 4.8  CL 110 110 111 109 109  CO2 19* 20* 18* 22 20*  GLUCOSE 169* 145* 118* 147* 149*  BUN 76* 69* 62* 57* 50*  CREATININE 3.18* 3.14* 3.25* 3.13* 3.13*  CALCIUM 8.2* 7.8* 7.5* 8.0* 8.1*   GFR: Estimated Creatinine Clearance: 11.5 mL/min (A) (by C-G formula based on SCr of 3.13 mg/dL (H)). Liver Function Tests: No results for input(s): AST, ALT, ALKPHOS, BILITOT, PROT, ALBUMIN in the last 168 hours. No results for input(s): LIPASE, AMYLASE in the  last 168 hours. No results for input(s): AMMONIA in the last 168 hours. Coagulation Profile: Recent Labs  Lab 04/07/19 1555 04/08/19 0043  INR 1.1 1.2   Cardiac Enzymes: No results for input(s): CKTOTAL, CKMB, CKMBINDEX, TROPONINI in the last 168 hours. BNP (last 3 results) No results for input(s): PROBNP in the last 8760 hours. HbA1C: No results for input(s): HGBA1C in the last 72 hours. CBG: Recent Labs  Lab 04/07/19 1149 04/07/19 1625 04/07/19 2206 04/08/19 0803 04/08/19 1146  GLUCAP 219* 150* 176* 136* 100*   Lipid Profile: No results for input(s): CHOL, HDL, LDLCALC, TRIG, CHOLHDL, LDLDIRECT in the last 72 hours. Thyroid Function Tests: No results for input(s): TSH, T4TOTAL, FREET4, T3FREE, THYROIDAB in the last 72 hours. Anemia Panel: No results for input(s): VITAMINB12, FOLATE, FERRITIN, TIBC, IRON, RETICCTPCT in the last 72 hours. Sepsis Labs: No results for input(s): PROCALCITON, LATICACIDVEN in the last 168 hours.  Recent Results (from the past 240 hour(s))  Culture, Urine     Status: Abnormal   Collection Time: 04/03/19  2:58 PM   Specimen: Urine, Catheterized  Result Value Ref Range Status  Specimen Description   Final    URINE, CATHETERIZED Performed at [facility], [place] [address] [place], [place] [address]    Special Requests   Final    NONE Performed at [facility], [place] [address], [place], [place] [address]    Culture 80,000 COLONIES/mL YEAST (A)  Final   Report Status 04/04/2019 FINAL  Final  SARS CORONAVIRUS 2 (TAT 6-24 HRS) Nasopharyngeal Nasopharyngeal Swab     Status: None   Collection Time: 04/06/19  8:33 PM   Specimen: Nasopharyngeal Swab  Result Value Ref Range Status   SARS Coronavirus 2 NEGATIVE NEGATIVE Final    Comment: (NOTE) SARS-CoV-2 target nucleic acids are NOT DETECTED. The SARS-CoV-2 RNA is generally detectable in upper and lower respiratory specimens during the acute phase of  infection. Negative results do not preclude SARS-CoV-2 infection, do not rule out co-infections with other pathogens, and should not be used as the sole basis for treatment or other patient management decisions. Negative results must be combined with clinical observations, patient history, and epidemiological information. The expected result is Negative. Fact Sheet for Patients: SugarRoll.be Fact Sheet for Healthcare Providers: https://www.woods-mathews.com/ This test is not yet approved or cleared by the [place] FDA and  has been authorized for detection and/or diagnosis of SARS-CoV-2 by FDA under an Emergency Use Authorization (EUA). This EUA will remain  in effect (meaning this test can be used) for the duration of the COVID-19 declaration under Section 56 4(b)(1) of the Act, 21 U.S.C. section 360bbb-3(b)(1), unless the authorization is terminated or revoked sooner. Performed at [facility], [place] [address], [place], [place] [address]      Radiology Studies: Vas [place] Upper Extremity Venous Duplex  Result Date: 04/08/2019 UPPER VENOUS STUDY  Indications: Pain, Swelling, and SOB Risk Factors: Type 2 DM, Stage IV CKD, and non STEMI and acute systolic CHF. Limitations: IV placement in the distal to mid upper arm and dressing. Comparison Study: No previous exam available Performing Technologist: Toma Copier RVS  Examination Guidelines: A complete evaluation includes B-mode imaging, spectral Doppler, color Doppler, and power Doppler as needed of all accessible portions of each vessel. Bilateral testing is considered an integral part of a complete examination. Limited examinations for reoccurring indications may be performed as noted.  Right Findings: +----------+------------+---------+-----------+----------+---------------------+  RIGHT      Compressible Phasicity Spontaneous Properties        Summary          +----------+------------+---------+-----------+----------+---------------------+  IJV            Full        Yes        Yes                                       +----------+------------+---------+-----------+----------+---------------------+  Subclavian     Full        Yes        Yes                                       +----------+------------+---------+-----------+----------+---------------------+  Axillary       Full        Yes        Yes                                       +----------+------------+---------+-----------+----------+---------------------+  Brachial     Partial                                       Acute, Unable to                                                               visualize the distal                                                               region due to IV                                                                     dessing.         +----------+------------+---------+-----------+----------+---------------------+  Radial         Full                                                             +----------+------------+---------+-----------+----------+---------------------+  Ulnar          Full                                                             +----------+------------+---------+-----------+----------+---------------------+  Cephalic       Full                                                             +----------+------------+---------+-----------+----------+---------------------+  Basilic        Full                                                             +----------+------------+---------+-----------+----------+---------------------+  Left Findings: +----------+------------+---------+-----------+----------+---------------------+  LEFT       Compressible Phasicity Spontaneous Properties        Summary         +----------+------------+---------+-----------+----------+---------------------+  IJV            Full        Yes  Yes                                        +----------+------------+---------+-----------+----------+---------------------+  Subclavian                 Yes        Yes                 Unable to compress                                                               due to the clavicle   +----------+------------+---------+-----------+----------+---------------------+  Summary:  Right: No evidence of superficial vein thrombosis in the upper extremity. No evidence of thrombosis in the subclavian. Findings consistent with acute deep vein thrombosis involving the right brachial veins.  Left: No evidence of thrombosis in the subclavian.  *See table(s) above for measurements and observations.  Diagnosing physician: Servando Snare MD Electronically signed by Servando Snare MD on 04/08/2019 at 12:14:31 AM.    Final     Scheduled Meds:  aspirin EC  81 mg Oral Daily   atorvastatin  40 mg Oral q1800   carvedilol  25 mg Oral BID   fluticasone  1 spray Each Nare q1800   hydrALAZINE  25 mg Oral Q8H   influenza vaccine adjuvanted  0.5 mL Intramuscular Once   insulin aspart  0-5 Units Subcutaneous QHS   insulin aspart  0-9 Units Subcutaneous TID WC   levothyroxine  50 mcg Oral Q0600   loratadine  10 mg Oral Daily   nystatin   Topical TID   OLANZapine  15 mg Oral QHS   sodium bicarbonate  650 mg Oral BID   terazosin  5 mg Oral QHS   warfarin  2.5 mg Oral ONCE-1800   Warfarin - Pharmacist Dosing Inpatient   Does not apply q1800   Continuous Infusions:  cefTRIAXone (ROCEPHIN)  IV 1 g (04/08/19 1100)   heparin 400 Units/hr (04/08/19 1238)     LOS: 12 days   Marylu Lund, MD [facility] Pager On Amion  If 7PM-7AM, please contact night-coverage 04/08/2019, 3:53 PM

## 2019-04-08 NOTE — Progress Notes (Signed)
ANTICOAGULATION CONSULT NOTE - Initial Consult  Pharmacy Consult for heparin and warfarin Indication: DVT (acute)  Allergies  Allergen Reactions  . Fosamax [Alendronate] Other (See Comments)    Noted to be an allergy on the patient's paperwork from facility, but no reaction is recorded  . Macrodantin [Nitrofurantoin] Other (See Comments)    Noted to be an allergy on the patient's paperwork from facility, but no reaction is recorded    Patient Measurements: Height: 5\' 3"  (160 cm) Weight: 126 lb 8 oz (57.4 kg) IBW/kg (Calculated) : 52.4 Heparin Dosing Weight = TBW = 57 kg  Vital Signs: Temp: 98.5 F (36.9 C) (09/12 0431) Temp Source: Oral (09/12 0431) BP: 137/67 (09/12 0431) Pulse Rate: 78 (09/12 0431)  Labs: Recent Labs    04/06/19 0436 04/07/19 0437 04/07/19 1555 04/08/19 0043 04/08/19 1051  HGB 8.3* 8.2*  --  8.2*  --   HCT 26.3* 26.7*  --  25.0*  --   PLT 212 204  --  167  --   APTT  --   --  37*  --   --   LABPROT  --   --  13.8 15.0  --   INR  --   --  1.1 1.2  --   HEPARINUNFRC  --   --   --  0.89* 0.79*  CREATININE 3.25* 3.13*  --  3.13*  --     Estimated Creatinine Clearance: 11.5 mL/min (A) (by C-G formula based on SCr of 3.13 mg/dL (H)).   Medical History: Past Medical History:  Diagnosis Date  . Acute respiratory failure ([place]) 09/2016  . Diabetes mellitus without complication ([place])   . Epilepsy ([place])   . Hyperlipidemia   . Hypertension   . Renal disorder    Stage 4 CKD  . Schizoaffective disorder ([place])   . Stroke ([place])   . Thyroid disease    hypothyroid  . Vitamin D deficiency     Assessment: Pharmacy consulted to dose/monitor warfarin with a heparin bridge in this 84 year old female with newly diagnosed DVT in right brachial veins. Pt was not on anticoagulation PTA. Patient was on HSQ 5000 units TID for DVT ppx and was previously on heparin infusion during this admission for ACS.  Significant Events: -9/11: Diagnosed with RUE DVT -9/11:  clopidogrel discontinued  Today, 04/08/19  Hgb 8.2 - low but stable  Plt 167 - WNL  SCr = 3.1, CrCL ~11 mL/min. Unchanged.  INR = 1.2 remains subtherapeutic as expected. Day #2 of new start warfarin  HL = 0.79 remains supratherapeutic despite decreasing heparin infusion to 500 units/hr  Confirmed with RN that heparin labs drawn appropriately and should reflect accurate level. Heparin infusing in left arm, HL obtained from right arm.  Confirmed with RN that heparin infusing at correct rate with no reported issues. No signs/symptoms bleeding or bruising.  Patient has carb modified diet ordered, charted eating 100 % of meals yesterday  No significant DDI that would induce/inhibit warfarin, however, noted that patient is currently prescribed ASA 81 mg PO daily and ceftriaxone.    Goal of Therapy:  INR 2-3 Heparin level 0.3-0.7 units/ml Monitor platelets by anticoagulation protocol: Yes   Plan:   Decrease heparin infusion to 400 units/hr  Check 8 hour HL  HL and CBC daily while on heparin infusion  INR daily  Warfarin 2.5 mg PO once  @ 1800 this evening  Due to patient's renal function, LMWH is not an option for bridge therapy.  Patient will need to be bridged with IV heparin for a minimum of 5 days and until INR is therapeutic x2 consecutive days.  Lenis Noon, PharmD 04/08/2019,12:00 PM

## 2019-04-08 NOTE — Progress Notes (Signed)
ANTICOAGULATION CONSULT NOTE - Follow Up Consult  Pharmacy Consult for Heparin Indication: DVT (acute)  Allergies  Allergen Reactions  . Fosamax [Alendronate] Other (See Comments)    Noted to be an allergy on the patient's paperwork from facility, but no reaction is recorded  . Macrodantin [Nitrofurantoin] Other (See Comments)    Noted to be an allergy on the patient's paperwork from facility, but no reaction is recorded    Patient Measurements: Height: 5\' 3"  (160 cm) Weight: 126 lb 8 oz (57.4 kg) IBW/kg (Calculated) : 52.4 Heparin Dosing Weight:   Vital Signs: Temp: 98.1 F (36.7 C) (09/12 2048) Temp Source: Oral (09/12 1339) BP: 155/73 (09/12 2048) Pulse Rate: 104 (09/12 2048)  Labs: Recent Labs    04/06/19 0436 04/07/19 0437 04/07/19 1555 04/08/19 0043 04/08/19 1051 04/08/19 2057  HGB 8.3* 8.2*  --  8.2*  --   --   HCT 26.3* 26.7*  --  25.0*  --   --   PLT 212 204  --  167  --   --   APTT  --   --  37*  --   --   --   LABPROT  --   --  13.8 15.0  --   --   INR  --   --  1.1 1.2  --   --   HEPARINUNFRC  --   --   --  0.89* 0.79* 0.27*  CREATININE 3.25* 3.13*  --  3.13*  --   --     Estimated Creatinine Clearance: 11.5 mL/min (A) (by C-G formula based on SCr of 3.13 mg/dL (H)).   Medications:  Infusions:  . cefTRIAXone (ROCEPHIN)  IV Stopped (04/08/19 1132)  . heparin 450 Units/hr (04/08/19 2242)    Assessment: Patient with low heparin level.  No heparin issues per RN.  Goal of Therapy:  Heparin level 0.3-0.7 units/ml Monitor platelets by anticoagulation protocol: Yes   Plan:  Increase heparin to 450 units/hr Recheck level at 0800  Nani Skillern Crowford 04/08/2019,11:19 PM

## 2019-04-08 NOTE — Progress Notes (Signed)
Central Telemetry called saying PT HR dropped to 30-40's but went right back up. Looked at monitor it was at 75. MD notified.

## 2019-04-09 LAB — PROTIME-INR
INR: 1.4 — ABNORMAL HIGH (ref 0.8–1.2)
Prothrombin Time: 17.3 seconds — ABNORMAL HIGH (ref 11.4–15.2)

## 2019-04-09 LAB — CBC
HCT: 25.7 % — ABNORMAL LOW (ref 36.0–46.0)
Hemoglobin: 8.2 g/dL — ABNORMAL LOW (ref 12.0–15.0)
MCH: 31.5 pg (ref 26.0–34.0)
MCHC: 31.9 g/dL (ref 30.0–36.0)
MCV: 98.8 fL (ref 80.0–100.0)
Platelets: 181 10*3/uL (ref 150–400)
RBC: 2.6 MIL/uL — ABNORMAL LOW (ref 3.87–5.11)
RDW: 14 % (ref 11.5–15.5)
WBC: 14.1 10*3/uL — ABNORMAL HIGH (ref 4.0–10.5)
nRBC: 0 % (ref 0.0–0.2)

## 2019-04-09 LAB — GLUCOSE, CAPILLARY
Glucose-Capillary: 87 mg/dL (ref 70–99)
Glucose-Capillary: 88 mg/dL (ref 70–99)

## 2019-04-09 LAB — HEPARIN LEVEL (UNFRACTIONATED)
Heparin Unfractionated: 0.24 IU/mL — ABNORMAL LOW (ref 0.30–0.70)
Heparin Unfractionated: 0.37 IU/mL (ref 0.30–0.70)

## 2019-04-09 MED ORDER — WARFARIN SODIUM 2 MG PO TABS
2.0000 mg | ORAL_TABLET | Freq: Once | ORAL | Status: AC
  Administered 2019-04-09: 2 mg via ORAL
  Filled 2019-04-09: qty 1

## 2019-04-09 MED ORDER — HEPARIN (PORCINE) 25000 UT/250ML-% IV SOLN
550.0000 [IU]/h | INTRAVENOUS | Status: DC
  Administered 2019-04-09 – 2019-04-11 (×3): 550 [IU]/h via INTRAVENOUS
  Filled 2019-04-09 (×2): qty 250

## 2019-04-09 NOTE — Progress Notes (Addendum)
ANTICOAGULATION CONSULT NOTE - Initial Consult  Pharmacy Consult for heparin and warfarin Indication: DVT (acute)  Allergies  Allergen Reactions  . Fosamax [Alendronate] Other (See Comments)    Noted to be an allergy on the patient's paperwork from facility, but no reaction is recorded  . Macrodantin [Nitrofurantoin] Other (See Comments)    Noted to be an allergy on the patient's paperwork from facility, but no reaction is recorded    Patient Measurements: Height: 5\' 3"  (160 cm) Weight: 126 lb 8 oz (57.4 kg) IBW/kg (Calculated) : 52.4 Heparin Dosing Weight = TBW = 57 kg  Vital Signs: Temp: 98.1 F (36.7 C) (09/13 0453) Temp Source: Oral (09/13 0453) BP: 154/76 (09/13 0453) Pulse Rate: 100 (09/13 0453)  Labs: Recent Labs    04/07/19 0437 04/07/19 1555  04/08/19 0043 04/08/19 1051 04/08/19 2057 04/09/19 0758  HGB 8.2*  --   --  8.2*  --   --  8.2*  HCT 26.7*  --   --  25.0*  --   --  25.7*  PLT 204  --   --  167  --   --  181  APTT  --  37*  --   --   --   --   --   LABPROT  --  13.8  --  15.0  --   --  17.3*  INR  --  1.1  --  1.2  --   --  1.4*  HEPARINUNFRC  --   --    < > 0.89* 0.79* 0.27* 0.24*  CREATININE 3.13*  --   --  3.13*  --   --   --    < > = values in this interval not displayed.    Estimated Creatinine Clearance: 11.5 mL/min (A) (by C-G formula based on SCr of 3.13 mg/dL (H)).   Medical History: Past Medical History:  Diagnosis Date  . Acute respiratory failure ([place]) 09/2016  . Diabetes mellitus without complication ([place])   . Epilepsy ([place])   . Hyperlipidemia   . Hypertension   . Renal disorder    Stage 4 CKD  . Schizoaffective disorder ([place])   . Stroke ([place])   . Thyroid disease    hypothyroid  . Vitamin D deficiency     Assessment: Pharmacy consulted to dose/monitor warfarin with a heparin bridge in this 84 year old female with newly diagnosed DVT in right brachial veins. Pt was not on anticoagulation PTA. Patient was on HSQ 5000 units  TID for DVT ppx and was previously on heparin infusion during this admission for ACS.  Significant Events: -9/11: Diagnosed with RUE DVT -9/11: clopidogrel discontinued  Today, 04/09/19  Hgb 8.2 - low but stable  Plt 181 - WNL  SCr = 3.1, CrCL ~11 mL/min. Unchanged.  INR = 1.4 remains subtherapeutic as expected, but is increasing. Day #3 of new start warfarin  HL = 0.24 remains subtherapeutic. HL decreased despite increasing heparin infusion to 450 units/hr.   Confirmed with RN that heparin infusing at correct rate with no reported issues or interruptions. No signs/symptoms bleeding or bruising.  Patient has carb modified diet ordered, reduced PO intake yesterday, but pt ate majority of breakfast this morning.  No significant DDI that would induce/inhibit warfarin, however, noted that patient is currently prescribed ASA 81 mg PO daily and ceftriaxone.    Goal of Therapy:  INR 2-3 Heparin level 0.3-0.7 units/ml Monitor platelets by anticoagulation protocol: Yes   Plan:   No heparin  bolus  Increase heparin infusion to 550 units/hr  Check 8 hour HL  HL and CBC daily while on heparin infusion  INR daily  Warfarin 2 mg PO once  @ 1800 this evening  Due to patient's renal function, LMWH is not an option for bridge therapy. Patient will need to be bridged with IV heparin for a minimum of 5 days and until INR is therapeutic x2 consecutive days.  Lenis Noon, PharmD 04/09/2019,9:20 AM  Addendum: Evening heparin level follow up  Assessment:   HL @ 1728 = 0.37 is therapeutic on heparin infusion of 550 units/hr  Confirmed with RN that heparin infusing at correct rate. No issues with infusion. No signs/symptoms of bleeding or bruising.   Plan:  Continue heparin infusion at current rate of 550 units/hr  Check confirmatory HL in 8 hours  Lenis Noon, PharmD 04/09/19

## 2019-04-09 NOTE — Progress Notes (Signed)
PROGRESS NOTE    Michiah Beda  [id number] DOB: 1934-09-27 DOA: 03/21/2019 PCP: Crist Infante, MD    Brief Narrative:  84 year old female with history of diabetes mellitus type 2, hypertension, hyperlipidemia, stage IV CKD, schizoaffective disorder, hypothyroidism admitted on 03/11/2019 complains of shortness of breath.  Found to have non-STEMI and acute systolic CHF.  Cardiology was consulted.  Assessment & Plan:   Active Problems:   AKI (acute kidney injury) ([place])   Palliative care by specialist   DNR (do not resuscitate) discussion   Non-ST elevation (NSTEMI) myocardial infarction ([place])   Acute systolic heart failure ([place])   Chronic kidney disease (CKD), stage IV (severe) ([place])   Adult failure to thrive   1. Acute systolic CHF-EF 30 to [id number] on echocardiogram with hypokinesis of the left ventricle and apical area as well as dyskinesis of the left apical segment. Pt now off diuretics and cardiology since signed off  2. Tinea cruris/candidiasis-Pt was started on nystatin powder 3 times daily, Desitin cream. Presently stable at this time  3. ?  UTI-patient has abnormal UA, complains of dysuria.  Urine culture has been obtained.  Will start ceftriaxone 1 g IV daily.  WBC peaked to over 22,000.  She has been afebrile.  WBC now improving, currently 14.1   4. Non-STEMI-patient had significantly elevated high-sensitivity troponin 3824, TTE suggested anterior infarct. Patient noted cardiac cath candidate considering multiple medical issues.  Cardiology had been following.  Medical management recommended with ASA, statin, and BB  5. ?community-acquired pneumonia-patient was empirically started on ceftriaxone and Zithromax at the time of admission for possible CAP.  At this time, does not appear patient has infectious process.  PNA specific antibiotics were discontinued.  Solu-Medrol has been discontinued. Presently stable on minimal O2 support  6. Acute kidney injury on CKD stage  IV-patient baseline creatinine 1.5, creatinine is rising today is 3.85.  Palliative care was consulted and family wants to continue with medical management for now.  Patient is not a good candidate for hemodialysis.  Nephrology was consulted and pt was continued on gentle IVF with improving renal function. Renal US reviewed, findings suggesting advanced renal disease. Recheck bmet in AM  7. Hypertension-blood pressures controlled, continue home medications. Remains stable at this time  8. Type 2 diabetes mellitus-continue sliding scale insulin with NovoLog.  CBG remains stable at this time  9. Schizoaffective disorder-patient is on multiple psychotropic medications. Currently stable  10. Hypothyroidism-continue Synthroid.  11. RUE DVT - New finding this admission. UE dopplers with new RUE DVT noted. Given renal function, have continued coumadin with heparin bridge, pharmacy to dose. Current INR 1.4  DVT prophylaxis: Heparin bridge with coumadin Code Status: DNR Family Communication: Pt in room, family not at bedside Disposition Plan: SNF when INR therapeutic  Consultants:   Cardiology  Nephrology  Procedures:     Antimicrobials: Anti-infectives (From admission, onward)   Start     Dose/Rate Route Frequency Ordered Stop   04/04/19 1100  cefTRIAXone (ROCEPHIN) 1 g in sodium chloride 0.9 % 100 mL IVPB     1 g 200 mL/hr over 30 Minutes Intravenous Every 24 hours 04/04/19 1037     03/22/2019 0900  cefTRIAXone (ROCEPHIN) 2 g in sodium chloride 0.9 % 100 mL IVPB  Status:  Discontinued     2 g 200 mL/hr over 30 Minutes Intravenous Every 24 hours 03/14/2019 0859 03/30/19 0838   03/26/2019 0900  azithromycin (ZITHROMAX) 500 mg in sodium chloride 0.9 % 250 mL IVPB  Status:  Discontinued     500 mg 250 mL/hr over 60 Minutes Intravenous Every 24 hours 03/16/2019 0859 03/30/19 0838      Subjective: Sleeping, without complaints on awakening  Objective: Vitals:   04/08/19 2048 04/09/19  0453 04/09/19 1337 04/09/19 1518  BP: (!) 155/73 (!) 154/76 134/69 (!) 166/86  Pulse: (!) 104 100 76 83  Resp: 18 18 19    Temp: 98.1 F (36.7 C) 98.1 F (36.7 C) 97.8 F (36.6 C)   TempSrc:  Oral Oral   SpO2: 93% 98% 96%   Weight:      Height:        Intake/Output Summary (Last 24 hours) at 04/09/2019 1632 Last data filed at 04/09/2019 1602 Gross per 24 hour  Intake 692.63 ml  Output -  Net 692.63 ml   Filed Weights   04/03/19 0458 04/04/19 0505 04/08/19 0431  Weight: 56.9 kg 58.9 kg 57.4 kg    Examination: General exam: Conversant, in no acute distress Respiratory system: normal chest rise, clear, no audible wheezing  Data Reviewed: I have personally reviewed following labs and imaging studies  CBC: Recent Labs  Lab 04/05/19 0408 04/06/19 0436 04/07/19 0437 04/08/19 0043 04/09/19 0758  WBC 16.2* 15.5* 14.6* 14.8* 14.1*  HGB 8.5* 8.3* 8.2* 8.2* 8.2*  HCT 26.1* 26.3* 26.7* 25.0* 25.7*  MCV 97.0 96.7 98.9 96.9 98.8  PLT 237 212 204 167 0000000   Basic Metabolic Panel: Recent Labs  Lab 04/04/19 0419 04/05/19 0408 04/06/19 0436 04/07/19 0437 04/08/19 0043  NA 137 136 139 136 136  K 4.7 4.3 4.4 4.5 4.8  CL 110 110 111 109 109  CO2 19* 20* 18* 22 20*  GLUCOSE 169* 145* 118* 147* 149*  BUN 76* 69* 62* 57* 50*  CREATININE 3.18* 3.14* 3.25* 3.13* 3.13*  CALCIUM 8.2* 7.8* 7.5* 8.0* 8.1*   GFR: Estimated Creatinine Clearance: 11.5 mL/min (A) (by C-G formula based on SCr of 3.13 mg/dL (H)). Liver Function Tests: No results for input(s): AST, ALT, ALKPHOS, BILITOT, PROT, ALBUMIN in the last 168 hours. No results for input(s): LIPASE, AMYLASE in the last 168 hours. No results for input(s): AMMONIA in the last 168 hours. Coagulation Profile: Recent Labs  Lab 04/07/19 1555 04/08/19 0043 04/09/19 0758  INR 1.1 1.2 1.4*   Cardiac Enzymes: No results for input(s): CKTOTAL, CKMB, CKMBINDEX, TROPONINI in the last 168 hours. BNP (last 3 results) No results for  input(s): PROBNP in the last 8760 hours. HbA1C: No results for input(s): HGBA1C in the last 72 hours. CBG: Recent Labs  Lab 04/07/19 2206 04/08/19 0803 04/08/19 1146 04/08/19 2046 04/09/19 1222  GLUCAP 176* 136* 100* 195* 87   Lipid Profile: No results for input(s): CHOL, HDL, LDLCALC, TRIG, CHOLHDL, LDLDIRECT in the last 72 hours. Thyroid Function Tests: No results for input(s): TSH, T4TOTAL, FREET4, T3FREE, THYROIDAB in the last 72 hours. Anemia Panel: No results for input(s): VITAMINB12, FOLATE, FERRITIN, TIBC, IRON, RETICCTPCT in the last 72 hours. Sepsis Labs: No results for input(s): PROCALCITON, LATICACIDVEN in the last 168 hours.  Recent Results (from the past 240 hour(s))  Culture, Urine     Status: Abnormal   Collection Time: 04/03/19  2:58 PM   Specimen: Urine, Catheterized  Result Value Ref Range Status   Specimen Description   Final    URINE, CATHETERIZED Performed at [facility] [address], [place], [place] [address]    Special Requests   Final    NONE Performed at [facility], [place] [address], [place], [place] [address]    Culture 80,000 COLONIES/mL YEAST (A)  Final   Report Status 04/04/2019 FINAL  Final  SARS CORONAVIRUS 2 (TAT 6-24 HRS) Nasopharyngeal Nasopharyngeal Swab     Status: None   Collection Time: 04/06/19  8:33 PM   Specimen: Nasopharyngeal Swab  Result Value Ref Range Status   SARS Coronavirus 2 NEGATIVE NEGATIVE Final    Comment: (NOTE) SARS-CoV-2 target nucleic acids are NOT DETECTED. The SARS-CoV-2 RNA is generally detectable in upper and lower respiratory specimens during the acute phase of infection. Negative results do not preclude SARS-CoV-2 infection, do not rule out co-infections with other pathogens, and should not be used as the sole basis for treatment or other patient management decisions. Negative results must be combined with clinical observations, patient history, and  epidemiological information. The expected result is Negative. Fact Sheet for Patients: SugarRoll.be Fact Sheet for Healthcare Providers: https://www.woods-mathews.com/ This test is not yet approved or cleared by the [place] FDA and  has been authorized for detection and/or diagnosis of SARS-CoV-2 by FDA under an Emergency Use Authorization (EUA). This EUA will remain  in effect (meaning this test can be used) for the duration of the COVID-19 declaration under Section 56 4(b)(1) of the Act, 21 U.S.C. section 360bbb-3(b)(1), unless the authorization is terminated or revoked sooner. Performed at [facility], [place] [address] [place], [place] [address]      Radiology Studies: No results found.  Scheduled Meds: . aspirin EC  81 mg Oral Daily  . atorvastatin  40 mg Oral q1800  . carvedilol  25 mg Oral BID  . fluticasone  1 spray Each Nare q1800  . hydrALAZINE  25 mg Oral Q8H  . insulin aspart  0-5 Units Subcutaneous QHS  . insulin aspart  0-9 Units Subcutaneous TID WC  . levothyroxine  50 mcg Oral Q0600  . loratadine  10 mg Oral Daily  . nystatin   Topical TID  . OLANZapine  15 mg Oral QHS  . sodium bicarbonate  650 mg Oral BID  . terazosin  5 mg Oral QHS  . warfarin  2 mg Oral ONCE-1800  . Warfarin - Pharmacist Dosing Inpatient   Does not apply q1800   Continuous Infusions: . cefTRIAXone (ROCEPHIN)  IV 1 g (04/09/19 1042)  . heparin 550 Units/hr (04/09/19 1602)     LOS: 13 days   Marylu Lund, MD [facility] Pager On Amion  If 7PM-7AM, please contact night-coverage 04/09/2019, 4:32 PM

## 2019-04-10 LAB — GLUCOSE, CAPILLARY
Glucose-Capillary: 116 mg/dL — ABNORMAL HIGH (ref 70–99)
Glucose-Capillary: 128 mg/dL — ABNORMAL HIGH (ref 70–99)
Glucose-Capillary: 147 mg/dL — ABNORMAL HIGH (ref 70–99)
Glucose-Capillary: 113 mg/dL — ABNORMAL HIGH (ref 70–99)
Glucose-Capillary: 208 mg/dL — ABNORMAL HIGH (ref 70–99)
Glucose-Capillary: 210 mg/dL — ABNORMAL HIGH (ref 70–99)
Glucose-Capillary: 183 mg/dL — ABNORMAL HIGH (ref 70–99)

## 2019-04-10 LAB — COMPREHENSIVE METABOLIC PANEL
ALT: 17 U/L (ref 0–44)
AST: 15 U/L (ref 15–41)
Albumin: 2.4 g/dL — ABNORMAL LOW (ref 3.5–5.0)
Alkaline Phosphatase: 39 U/L (ref 38–126)
Anion gap: 9 (ref 5–15)
BUN: 52 mg/dL — ABNORMAL HIGH (ref 8–23)
CO2: 18 mmol/L — ABNORMAL LOW (ref 22–32)
Calcium: 8.2 mg/dL — ABNORMAL LOW (ref 8.9–10.3)
Chloride: 110 mmol/L (ref 98–111)
Creatinine, Ser: 3.42 mg/dL — ABNORMAL HIGH (ref 0.44–1.00)
GFR calc Af Amer: 14 mL/min — ABNORMAL LOW (ref >60)
GFR calc non Af Amer: 12 mL/min — ABNORMAL LOW (ref >60)
Glucose, Bld: 100 mg/dL — ABNORMAL HIGH (ref 70–99)
Potassium: 5.1 mmol/L (ref 3.5–5.1)
Sodium: 137 mmol/L (ref 135–145)
Total Bilirubin: 0.5 mg/dL (ref 0.3–1.2)
Total Protein: 4.7 g/dL — ABNORMAL LOW (ref 6.5–8.1)

## 2019-04-10 LAB — CBC
HCT: 26.2 % — ABNORMAL LOW (ref 36.0–46.0)
Hemoglobin: 8.1 g/dL — ABNORMAL LOW (ref 12.0–15.0)
MCH: 30.9 pg (ref 26.0–34.0)
MCHC: 30.9 g/dL (ref 30.0–36.0)
MCV: 100 fL (ref 80.0–100.0)
Platelets: 173 10*3/uL (ref 150–400)
RBC: 2.62 MIL/uL — ABNORMAL LOW (ref 3.87–5.11)
RDW: 14.1 % (ref 11.5–15.5)
WBC: 12.7 10*3/uL — ABNORMAL HIGH (ref 4.0–10.5)
nRBC: 0 % (ref 0.0–0.2)

## 2019-04-10 LAB — PROTIME-INR
INR: 1.6 — ABNORMAL HIGH (ref 0.8–1.2)
Prothrombin Time: 18.8 seconds — ABNORMAL HIGH (ref 11.4–15.2)

## 2019-04-10 LAB — HEPARIN LEVEL (UNFRACTIONATED)
Heparin Unfractionated: 0.36 IU/mL (ref 0.30–0.70)
Heparin Unfractionated: 0.35 IU/mL (ref 0.30–0.70)

## 2019-04-10 MED ORDER — SODIUM CHLORIDE 0.9 % IV SOLN
INTRAVENOUS | Status: DC
  Administered 2019-04-10 – 2019-04-11 (×4): via INTRAVENOUS

## 2019-04-10 MED ORDER — SODIUM CHLORIDE 0.9 % IV SOLN
INTRAVENOUS | Status: DC | PRN
  Administered 2019-04-10: 250 mL via INTRAVENOUS

## 2019-04-10 MED ORDER — WARFARIN SODIUM 2.5 MG PO TABS
2.5000 mg | ORAL_TABLET | Freq: Once | ORAL | Status: AC
  Administered 2019-04-10: 17:00:00 2.5 mg via ORAL
  Filled 2019-04-10: qty 1

## 2019-04-10 NOTE — Discharge Instructions (Signed)

## 2019-04-10 NOTE — Progress Notes (Signed)
[facility] for heparin and warfarin Indication: DVT (acute)  Allergies  Allergen Reactions  . Fosamax [Alendronate] Other (See Comments)    Noted to be an allergy on the patient's paperwork from facility, but no reaction is recorded  . Macrodantin [Nitrofurantoin] Other (See Comments)    Noted to be an allergy on the patient's paperwork from facility, but no reaction is recorded    Patient Measurements: Height: 5\' 3"  (160 cm) Weight: 126 lb 12.2 oz (57.5 kg) IBW/kg (Calculated) : 52.4 Heparin Dosing Weight = TBW = 57 kg  Vital Signs: Temp: 98.7 F (37.1 C) (09/14 0931) Temp Source: Oral (09/14 0931) BP: 144/70 (09/14 0931) Pulse Rate: 87 (09/14 0931)  Labs: Recent Labs    04/07/19 1555  04/08/19 0043  04/09/19 0758 04/09/19 1728 04/10/19 0113 04/10/19 1048  HGB  --    < > 8.2*  --  8.2*  --  8.1*  --   HCT  --   --  25.0*  --  25.7*  --  26.2*  --   PLT  --   --  167  --  181  --  173  --   APTT 37*  --   --   --   --   --   --   --   LABPROT 13.8  --  15.0  --  17.3*  --  18.8*  --   INR 1.1  --  1.2  --  1.4*  --  1.6*  --   HEPARINUNFRC  --   --  0.89*   < > 0.24* 0.37 0.36 0.35  CREATININE  --   --  3.13*  --   --   --  3.42*  --    < > = values in this interval not displayed.    Estimated Creatinine Clearance: 10.5 mL/min (A) (by C-G formula based on SCr of 3.42 mg/dL (H)).   Assessment: Pharmacy consulted to dose/monitor warfarin with a heparin bridge in this 84 year old female with newly diagnosed DVT in right brachial veins. Pt was not on anticoagulation PTA. Admitted for ACS and treated with heparin infusion x 48 hrs, then transitioned to SQ heparin which continued from 9/5 until new DVT diagnosed on 9/11.  Significant Events: -9/11: Diagnosed with RUE DVT -9/11: clopidogrel discontinued  Today, 04/10/19  CBC: Hgb low but stable, Plt WNL; down from admission but stable for the past few days  SCr remains elevated;  current baseline unknown but remains higher than admission value of 2.6  INR remains subtherapeutic but trending up appropriately  Confirmatory HL remains therapeutic on 550 units/hr  No bleeding or infusion issues per RN  Diet ordered but continues to have poor appetite/intake  Drug interactions: none major, although patient continues on low-dose ASA and broad-spectrum abx   Goal of Therapy:  INR 2-3 Heparin level 0.3-0.7 units/ml Monitor platelets by anticoagulation protocol: Yes   Plan:   Continue heparin infusion at 550 units/hr  Warfarin 2.5 mg PO tonight x 1  Daily CBC, heparin level, and INR  Due to patient's renal function, LMWH is not an option for bridge therapy. Patient will need to be bridged with IV heparin for a minimum of 5 days and until INR is therapeutic x2 consecutive readings.  Reuel Boom, PharmD, BCPS [phone] 04/10/2019, 11:41 AM

## 2019-04-10 NOTE — Progress Notes (Signed)
PROGRESS NOTE    Ashley Velez  [id number] DOB: [birth year] DOA: 03/08/2019 PCP: Crist Infante, MD    Brief Narrative:  84 year old female with history of diabetes mellitus type 2, hypertension, hyperlipidemia, stage IV CKD, schizoaffective disorder, hypothyroidism admitted on 02/28/2019 complains of shortness of breath.  Found to have non-STEMI and acute systolic CHF.  Cardiology was consulted.  Assessment & Plan:   Active Problems:   AKI (acute kidney injury) ([place])   Palliative care by specialist   DNR (do not resuscitate) discussion   Non-ST elevation (NSTEMI) myocardial infarction ([place])   Acute systolic heart failure ([place])   Chronic kidney disease (CKD), stage IV (severe) ([place])   Adult failure to thrive   1. Acute systolic CHF-EF 30 to [id number] on echocardiogram with hypokinesis of the left ventricle and apical area as well as dyskinesis of the left apical segment. Pt now off diuretics and cardiology since signed off  2. Tinea cruris/candidiasis-Pt was started on nystatin powder 3 times daily, Desitin cream. Presently stable at this time  3. ?  UTI-patient has abnormal UA, complains of dysuria.  Urine culture has been obtained.  Will start ceftriaxone 1 g IV daily.  WBC peaked to over 22,000.  She has been afebrile.  WBC now improving, currently 12.7  4. Non-STEMI-patient had significantly elevated high-sensitivity troponin 3824, TTE suggested anterior infarct. Patient noted cardiac cath candidate considering multiple medical issues.  Cardiology had been following.  Medical management recommended with ASA, statin, and BB. Seems stable at this time  5. ?community-acquired pneumonia-patient was empirically started on ceftriaxone and Zithromax at the time of admission for possible CAP.  At this time, does not appear patient has infectious process.  PNA specific antibiotics were discontinued.  Solu-Medrol has been discontinued. Presently stable on minimal O2 support  6. Acute  kidney injury on CKD stage IV-patient baseline creatinine 1.5.  Palliative care was consulted and family wants to continue with medical management for now.  Patient is not a good candidate for hemodialysis. Cr had been improving slowly, however, renal function has worsened again. Will resume NS at 75cc/hr and will repeat bmet in AM  7. Hypertension-blood pressures controlled, continue home medications. Presently stable  8. Type 2 diabetes mellitus-continue sliding scale insulin with NovoLog. Blood glucose trends reviewed. Stable at this time  9. Schizoaffective disorder-patient is on multiple psychotropic medications. Presently stable  10. Hypothyroidism-continue Synthroid.  11. RUE DVT - New finding this admission. UE dopplers with new RUE DVT noted. Given renal function, have continued coumadin with heparin bridge, pharmacy to dose. Current INR now 1.6  DVT prophylaxis: Heparin bridge with coumadin Code Status: DNR Family Communication: Pt in room, family not at bedside Disposition Plan: SNF when INR therapeutic  Consultants:   Cardiology  Nephrology  Procedures:     Antimicrobials: Anti-infectives (From admission, onward)   Start     Dose/Rate Route Frequency Ordered Stop   04/04/19 1100  cefTRIAXone (ROCEPHIN) 1 g in sodium chloride 0.9 % 100 mL IVPB     1 g 200 mL/hr over 30 Minutes Intravenous Every 24 hours 04/04/19 1037     03/26/2019 0900  cefTRIAXone (ROCEPHIN) 2 g in sodium chloride 0.9 % 100 mL IVPB  Status:  Discontinued     2 g 200 mL/hr over 30 Minutes Intravenous Every 24 hours 03/06/2019 0859 03/30/19 0838   03/04/2019 0900  azithromycin (ZITHROMAX) 500 mg in sodium chloride 0.9 % 250 mL IVPB  Status:  Discontinued     500 mg  250 mL/hr over 60 Minutes Intravenous Every 24 hours 03/18/2019 0859 03/30/19 0838      Subjective: Without complaints this AM  Objective: Vitals:   04/10/19 0445 04/10/19 0500 04/10/19 0931 04/10/19 1337  BP: 135/68  (!) 144/70  119/84  Pulse: 84  87 81  Resp: 18   16  Temp: 98.2 F (36.8 C)  98.7 F (37.1 C) 98.6 F (37 C)  TempSrc:   Oral Oral  SpO2: 97%  95% 98%  Weight:  57.5 kg    Height:        Intake/Output Summary (Last 24 hours) at 04/10/2019 1758 Last data filed at 04/10/2019 1400 Gross per 24 hour  Intake 545.18 ml  Output 500 ml  Net 45.18 ml   Filed Weights   04/04/19 0505 04/08/19 0431 04/10/19 0500  Weight: 58.9 kg 57.4 kg 57.5 kg    Examination: General exam: Awake, laying in bed, in nad, dry mucus membranes Respiratory system: Normal respiratory effort, no wheezing Cardiovascular system: regular rate, s1, s2 Gastrointestinal system: Soft, nondistended, positive BS Central nervous system: CN2-12 grossly intact, strength intact Extremities: Perfused, no clubbing Skin: Normal skin turgor, no notable skin lesions seen Psychiatry: Mood normal // no visual hallucinations   Data Reviewed: I have personally reviewed following labs and imaging studies  CBC: Recent Labs  Lab 04/06/19 0436 04/07/19 0437 04/08/19 0043 04/09/19 0758 04/10/19 0113  WBC 15.5* 14.6* 14.8* 14.1* 12.7*  HGB 8.3* 8.2* 8.2* 8.2* 8.1*  HCT 26.3* 26.7* 25.0* 25.7* 26.2*  MCV 96.7 98.9 96.9 98.8 100.0  PLT 212 204 167 181 [id number]   Basic Metabolic Panel: Recent Labs  Lab 04/05/19 0408 04/06/19 0436 04/07/19 0437 04/08/19 0043 04/10/19 0113  NA 136 139 136 136 137  K 4.3 4.4 4.5 4.8 5.1  CL 110 111 109 109 110  CO2 20* 18* 22 20* 18*  GLUCOSE 145* 118* 147* 149* 100*  BUN 69* 62* 57* 50* 52*  CREATININE 3.14* 3.25* 3.13* 3.13* 3.42*  CALCIUM 7.8* 7.5* 8.0* 8.1* 8.2*   GFR: Estimated Creatinine Clearance: 10.5 mL/min (A) (by C-G formula based on SCr of 3.42 mg/dL (H)). Liver Function Tests: Recent Labs  Lab 04/10/19 0113  AST 15  ALT 17  ALKPHOS 39  BILITOT 0.5  PROT 4.7*  ALBUMIN 2.4*   No results for input(s): LIPASE, AMYLASE in the last 168 hours. No results for input(s): AMMONIA in the  last 168 hours. Coagulation Profile: Recent Labs  Lab 04/07/19 1555 04/08/19 0043 04/09/19 0758 04/10/19 0113  INR 1.1 1.2 1.4* 1.6*   Cardiac Enzymes: No results for input(s): CKTOTAL, CKMB, CKMBINDEX, TROPONINI in the last 168 hours. BNP (last 3 results) No results for input(s): PROBNP in the last 8760 hours. HbA1C: No results for input(s): HGBA1C in the last 72 hours. CBG: Recent Labs  Lab 04/09/19 1645 04/09/19 2032 04/10/19 0827 04/10/19 1133 04/10/19 1616  GLUCAP 88 147* 113* 208* 210*   Lipid Profile: No results for input(s): CHOL, HDL, LDLCALC, TRIG, CHOLHDL, LDLDIRECT in the last 72 hours. Thyroid Function Tests: No results for input(s): TSH, T4TOTAL, FREET4, T3FREE, THYROIDAB in the last 72 hours. Anemia Panel: No results for input(s): VITAMINB12, FOLATE, FERRITIN, TIBC, IRON, RETICCTPCT in the last 72 hours. Sepsis Labs: No results for input(s): PROCALCITON, LATICACIDVEN in the last 168 hours.  Recent Results (from the past 240 hour(s))  Culture, Urine     Status: Abnormal   Collection Time: 04/03/19  2:58 PM   Specimen: Urine, Catheterized  Result Value Ref Range Status   Specimen Description   Final    URINE, CATHETERIZED Performed at [facility] [address], [place], [place] [address]    Special Requests   Final    NONE Performed at [facility], [place] [address], [place], [place] [address]    Culture 80,000 COLONIES/mL YEAST (A)  Final   Report Status 04/04/2019 FINAL  Final  SARS CORONAVIRUS 2 (TAT 6-24 HRS) Nasopharyngeal Nasopharyngeal Swab     Status: None   Collection Time: 04/06/19  8:33 PM   Specimen: Nasopharyngeal Swab  Result Value Ref Range Status   SARS Coronavirus 2 NEGATIVE NEGATIVE Final    Comment: (NOTE) SARS-CoV-2 target nucleic acids are NOT DETECTED. The SARS-CoV-2 RNA is generally detectable in upper and lower respiratory specimens during the acute phase of infection.  Negative results do not preclude SARS-CoV-2 infection, do not rule out co-infections with other pathogens, and should not be used as the sole basis for treatment or other patient management decisions. Negative results must be combined with clinical observations, patient history, and epidemiological information. The expected result is Negative. Fact Sheet for Patients: SugarRoll.be Fact Sheet for Healthcare Providers: https://www.woods-mathews.com/ This test is not yet approved or cleared by the [place] FDA and  has been authorized for detection and/or diagnosis of SARS-CoV-2 by FDA under an Emergency Use Authorization (EUA). This EUA will remain  in effect (meaning this test can be used) for the duration of the COVID-19 declaration under Section 56 4(b)(1) of the Act, 21 U.S.C. section 360bbb-3(b)(1), unless the authorization is terminated or revoked sooner. Performed at [facility], [place] [address] [place], [place] [address]      Radiology Studies: No results found.  Scheduled Meds:  aspirin EC  81 mg Oral Daily   atorvastatin  40 mg Oral q1800   carvedilol  25 mg Oral BID   fluticasone  1 spray Each Nare q1800   hydrALAZINE  25 mg Oral Q8H   insulin aspart  0-5 Units Subcutaneous QHS   insulin aspart  0-9 Units Subcutaneous TID WC   levothyroxine  50 mcg Oral Q0600   loratadine  10 mg Oral Daily   nystatin   Topical TID   OLANZapine  15 mg Oral QHS   sodium bicarbonate  650 mg Oral BID   terazosin  5 mg Oral QHS   Warfarin - Pharmacist Dosing Inpatient   Does not apply q1800   Continuous Infusions:  sodium chloride 250 mL (04/10/19 1229)   cefTRIAXone (ROCEPHIN)  IV 1 g (04/10/19 1234)   heparin 550 Units/hr (04/10/19 0600)     LOS: 14 days   Marylu Lund, MD [facility] Pager On Amion  If 7PM-7AM, please contact night-coverage 04/10/2019, 5:58 PM

## 2019-04-10 NOTE — Progress Notes (Signed)
ANTICOAGULATION CONSULT NOTE - Follow Up Consult  Pharmacy Consult for Heparin Indication: DVT (acute)  Allergies  Allergen Reactions  . Fosamax [Alendronate] Other (See Comments)    Noted to be an allergy on the patient's paperwork from facility, but no reaction is recorded  . Macrodantin [Nitrofurantoin] Other (See Comments)    Noted to be an allergy on the patient's paperwork from facility, but no reaction is recorded    Patient Measurements: Height: 5\' 3"  (160 cm) Weight: 126 lb 8 oz (57.4 kg) IBW/kg (Calculated) : 52.4 Heparin Dosing Weight:   Vital Signs: Temp: 98.1 F (36.7 C) (09/13 2034) BP: 151/78 (09/13 2034) Pulse Rate: 84 (09/13 2034)  Labs: Recent Labs    04/07/19 0437  04/07/19 1555 04/08/19 0043  04/09/19 0758 04/09/19 1728 04/10/19 0113  HGB 8.2*  --   --  8.2*  --  8.2*  --  8.1*  HCT 26.7*  --   --  25.0*  --  25.7*  --  26.2*  PLT 204  --   --  167  --  181  --  173  APTT  --   --  37*  --   --   --   --   --   LABPROT  --    < > 13.8 15.0  --  17.3*  --  18.8*  INR  --    < > 1.1 1.2  --  1.4*  --  1.6*  HEPARINUNFRC  --   --   --  0.89*   < > 0.24* 0.37 0.36  CREATININE 3.13*  --   --  3.13*  --   --   --  3.42*   < > = values in this interval not displayed.    Estimated Creatinine Clearance: 10.5 mL/min (A) (by C-G formula based on SCr of 3.42 mg/dL (H)).   Medications:  Infusions:  . cefTRIAXone (ROCEPHIN)  IV Stopped (04/09/19 1127)  . heparin 550 Units/hr (04/10/19 0100)    Assessment: Patient with heparin level at goal.  No heparin issues noted.  Goal of Therapy:  Heparin level 0.3-0.7 units/ml Monitor platelets by anticoagulation protocol: Yes   Plan:  Continue heparin drip at current rate Recheck level at [place], [place] 04/10/2019,4:05 AM

## 2019-04-11 LAB — BASIC METABOLIC PANEL
Anion gap: 10 (ref 5–15)
BUN: 50 mg/dL — ABNORMAL HIGH (ref 8–23)
CO2: 18 mmol/L — ABNORMAL LOW (ref 22–32)
Calcium: 8.2 mg/dL — ABNORMAL LOW (ref 8.9–10.3)
Chloride: 109 mmol/L (ref 98–111)
Creatinine, Ser: 3.19 mg/dL — ABNORMAL HIGH (ref 0.44–1.00)
GFR calc Af Amer: 15 mL/min — ABNORMAL LOW (ref >60)
GFR calc non Af Amer: 13 mL/min — ABNORMAL LOW (ref >60)
Glucose, Bld: 167 mg/dL — ABNORMAL HIGH (ref 70–99)
Potassium: 4.9 mmol/L (ref 3.5–5.1)
Sodium: 137 mmol/L (ref 135–145)

## 2019-04-11 LAB — CBC
HCT: 25.7 % — ABNORMAL LOW (ref 36.0–46.0)
Hemoglobin: 8.2 g/dL — ABNORMAL LOW (ref 12.0–15.0)
MCH: 31.1 pg (ref 26.0–34.0)
MCHC: 31.9 g/dL (ref 30.0–36.0)
MCV: 97.3 fL (ref 80.0–100.0)
Platelets: 166 10*3/uL (ref 150–400)
RBC: 2.64 MIL/uL — ABNORMAL LOW (ref 3.87–5.11)
RDW: 14.1 % (ref 11.5–15.5)
WBC: 14 10*3/uL — ABNORMAL HIGH (ref 4.0–10.5)
nRBC: 0 % (ref 0.0–0.2)

## 2019-04-11 LAB — GLUCOSE, CAPILLARY
Glucose-Capillary: 147 mg/dL — ABNORMAL HIGH (ref 70–99)
Glucose-Capillary: 164 mg/dL — ABNORMAL HIGH (ref 70–99)
Glucose-Capillary: 237 mg/dL — ABNORMAL HIGH (ref 70–99)
Glucose-Capillary: 226 mg/dL — ABNORMAL HIGH (ref 70–99)

## 2019-04-11 LAB — PROTIME-INR
INR: 1.8 — ABNORMAL HIGH (ref 0.8–1.2)
Prothrombin Time: 20.5 seconds — ABNORMAL HIGH (ref 11.4–15.2)

## 2019-04-11 LAB — HEPARIN LEVEL (UNFRACTIONATED): Heparin Unfractionated: 0.24 IU/mL — ABNORMAL LOW (ref 0.30–0.70)

## 2019-04-11 MED ORDER — HEPARIN (PORCINE) 25000 UT/250ML-% IV SOLN
600.0000 [IU]/h | INTRAVENOUS | Status: DC
  Filled 2019-04-11: qty 250

## 2019-04-11 MED ORDER — ADULT MULTIVITAMIN W/MINERALS CH
1.0000 | ORAL_TABLET | Freq: Every day | ORAL | Status: DC
  Administered 2019-04-11: 1 via ORAL
  Filled 2019-04-11: qty 1

## 2019-04-11 MED ORDER — ENSURE ENLIVE PO LIQD
237.0000 mL | Freq: Two times a day (BID) | ORAL | Status: DC
  Administered 2019-04-11 (×2): 237 mL via ORAL

## 2019-04-11 MED ORDER — MORPHINE SULFATE (PF) 2 MG/ML IV SOLN
2.0000 mg | Freq: Once | INTRAVENOUS | Status: AC
  Administered 2019-04-12: 2 mg via INTRAVENOUS
  Filled 2019-04-11: qty 1

## 2019-04-11 MED ORDER — NON FORMULARY: 6.0000 mg | Freq: Every evening | Status: DC | PRN

## 2019-04-11 MED ORDER — WARFARIN SODIUM 3 MG PO TABS
3.0000 mg | ORAL_TABLET | Freq: Once | ORAL | Status: AC
  Administered 2019-04-11: 3 mg via ORAL
  Filled 2019-04-11: qty 1

## 2019-04-11 MED ORDER — FUROSEMIDE 10 MG/ML IJ SOLN
40.0000 mg | Freq: Once | INTRAMUSCULAR | Status: AC
  Administered 2019-04-11: 21:00:00 40 mg via INTRAVENOUS
  Filled 2019-04-11: qty 4

## 2019-04-11 NOTE — Progress Notes (Signed)
Patient with new increased work of breathing upon assessment. VSS except RR in 30's. Patient with bilateral fine crackles in lower lobes. On call provider paged and respiratory called for prn breathing treatment. Post-nebulizer, patient with decreased complaints of work of breathing. Carnella [place] I

## 2019-04-11 NOTE — Progress Notes (Signed)
Ashley Velez for heparin and warfarin Indication: DVT (acute)  Allergies  Allergen Reactions  . Fosamax [Alendronate] Other (See Comments)    Noted to be an allergy on the patient's paperwork from facility, but no reaction is recorded  . Macrodantin [Nitrofurantoin] Other (See Comments)    Noted to be an allergy on the patient's paperwork from facility, but no reaction is recorded    Patient Measurements: Height: 5\' 3"  (160 cm) Weight: 126 lb 12.2 oz (57.5 kg) IBW/kg (Calculated) : 52.4 Heparin Dosing Weight = TBW = 57 kg  Vital Signs: Temp: 98.7 F (37.1 C) (09/15 0425) Temp Source: Oral (09/15 0425) BP: 141/69 (09/15 0425) Pulse Rate: 83 (09/15 0425)  Labs: Recent Labs    04/09/19 0758  04/10/19 0113 04/10/19 1048 04/11/19 0548 04/11/19 0759  HGB 8.2*  --  8.1*  --  8.2*  --   HCT 25.7*  --  26.2*  --  25.7*  --   PLT 181  --  173  --  166  --   LABPROT 17.3*  --  18.8*  --   --   --   INR 1.4*  --  1.6*  --   --   --   HEPARINUNFRC 0.24*   < > 0.36 0.35  --  0.24*  CREATININE  --   --  3.42*  --  3.19*  --    < > = values in this interval not displayed.    Estimated Creatinine Clearance: 11.2 mL/min (A) (by C-G formula based on SCr of 3.19 mg/dL (H)).   Assessment: Pharmacy consulted to dose/monitor warfarin with a heparin bridge in this 84 year old female with newly diagnosed DVT in right brachial veins. Pt was not on anticoagulation PTA. Admitted for ACS and treated with heparin infusion x 48 hrs, then transitioned to SQ heparin which continued from 9/5 until new DVT diagnosed on 9/11.  Significant Events: - 9/11: Diagnosed with RUE DVT; clopidogrel discontinued  Today, 04/11/2019:  CBC: Hgb low but stable, Plt WNL; down from admission but stable for the since 9/12  SCr remains elevated; current baseline unknown but remains higher than admission value of 2.6  INR remains subtherapeutic but trending up  appropriately  Most recent heparin level slightly low on 550 units/hr; no interruptions in therapy per RN  No bleeding or infusion issues per RN  Diet ordered but continues to have poor appetite/intake  Drug interactions: none major, although patient continues on low-dose ASA and broad-spectrum abx; Plavix has been stopped  Goal of Therapy:  INR 2-3 Heparin level 0.3-0.7 units/ml Monitor platelets by anticoagulation protocol: Yes   Plan:   Increase heparin to 600 units/hr (note patient previously SUPRAtherapeutic on this rate)  Warfarin 3 mg PO tonight x 1  Daily CBC, heparin level, and INR - patient very difficult stick and with limited sites d/t RUE DVT and heparin in L antecubital, so will defer recheck to tomorrow AM  Due to patient's renal function, LMWH is not an option for bridge therapy. Patient will need to be bridged with IV heparin for a minimum of 5 days and until INR is therapeutic x2 consecutive readings.  Reuel Boom, PharmD, BCPS [phone] 04/11/2019, 9:13 AM

## 2019-04-11 NOTE — Progress Notes (Signed)
PROGRESS NOTE    Ashley Velez  [id number] DOB: [birth year] DOA: 02/28/2019 PCP: Crist Infante, MD    Brief Narrative:  84 year old female with history of diabetes mellitus type 2, hypertension, hyperlipidemia, stage IV CKD, schizoaffective disorder, hypothyroidism admitted on 03/11/2019 complains of shortness of breath.  Found to have non-STEMI and acute systolic CHF.  Cardiology was consulted.  Assessment & Plan:   Active Problems:   AKI (acute kidney injury) ([place])   Palliative care by specialist   DNR (do not resuscitate) discussion   Non-ST elevation (NSTEMI) myocardial infarction ([place])   Acute systolic heart failure ([place])   Chronic kidney disease (CKD), stage IV (severe) ([place])   Adult failure to thrive   1. Acute systolic CHF-EF 30 to [id number] on echocardiogram with hypokinesis of the left ventricle and apical area as well as dyskinesis of the left apical segment. Pt now off diuretics and cardiology since signed off  2. Tinea cruris/candidiasis-Pt was started on nystatin powder 3 times daily, Desitin cream. Remains stable at this time  3. ?  UTI-patient has abnormal UA, complains of dysuria.  Urine culture has been obtained.  Will start ceftriaxone 1 g IV daily.  WBC peaked to over 22,000.  She has been afebrile.  WBC overall improved, Will repeat CBC in AM  4. Non-STEMI-patient had significantly elevated high-sensitivity troponin 3824, TTE suggested anterior infarct. Patient noted cardiac cath candidate considering multiple medical issues.  Cardiology had been following.  Medical management recommended with ASA, statin, and BB. Currently stable  5. ?community-acquired pneumonia-patient was empirically started on ceftriaxone and Zithromax at the time of admission for possible CAP.  At this time, does not appear patient has infectious process.  PNA specific antibiotics were discontinued.  Solu-Medrol has been discontinued. Currently stable on minimal O2 support  6. Acute  kidney injury on CKD stage IV-patient baseline creatinine 1.5.  Palliative care was consulted and family wants to continue with medical management for now.  Patient is not a good candidate for hemodialysis. Cr had been improving slowly, however, renal function has worsened again and pt was continued on NS at 75cc/hr. Renal function improving. Will repeat BMET in AM  7. Hypertension-blood pressures controlled, continue home medications. Currently stable  8. Type 2 diabetes mellitus-continue sliding scale insulin with NovoLog. Blood glucose trends reviewed. Stable at this time  9. Schizoaffective disorder-patient is on multiple psychotropic medications. Presently stable  10. Hypothyroidism-continue Synthroid.  11. RUE DVT - New finding this admission. UE dopplers with new RUE DVT noted. Given renal function, have continued coumadin with heparin bridge, pharmacy to dose. Current INR now 1.8  DVT prophylaxis: Heparin bridge with coumadin Code Status: DNR Family Communication: Pt in room, family not at bedside Disposition Plan: SNF when INR therapeutic  Consultants:   Cardiology  Nephrology  Procedures:     Antimicrobials: Anti-infectives (From admission, onward)   Start     Dose/Rate Route Frequency Ordered Stop   04/04/19 1100  cefTRIAXone (ROCEPHIN) 1 g in sodium chloride 0.9 % 100 mL IVPB  Status:  Discontinued     1 g 200 mL/hr over 30 Minutes Intravenous Every 24 hours 04/04/19 1037 04/11/19 0950   03/01/2019 0900  cefTRIAXone (ROCEPHIN) 2 g in sodium chloride 0.9 % 100 mL IVPB  Status:  Discontinued     2 g 200 mL/hr over 30 Minutes Intravenous Every 24 hours 03/09/2019 0859 03/30/19 0838   03/24/2019 0900  azithromycin (ZITHROMAX) 500 mg in sodium chloride 0.9 % 250 mL IVPB  Status:  Discontinued     500 mg 250 mL/hr over 60 Minutes Intravenous Every 24 hours 03/24/2019 0859 03/30/19 0838      Subjective: No complaints this AM  Objective: Vitals:   04/10/19 2218  04/11/19 0425 04/11/19 1321 04/11/19 1343  BP: (!) 144/65 (!) 141/69 138/70 117/62  Pulse: 86 83  85  Resp: (!) 28 20  20   Temp: 99.1 F (37.3 C) 98.7 F (37.1 C)  97.9 F (36.6 C)  TempSrc: Oral Oral  Oral  SpO2: 100% 98%  97%  Weight:      Height:        Intake/Output Summary (Last 24 hours) at 04/11/2019 1549 Last data filed at 04/11/2019 1300 Gross per 24 hour  Intake 181.99 ml  Output -  Net 181.99 ml   Filed Weights   04/04/19 0505 04/08/19 0431 04/10/19 0500  Weight: 58.9 kg 57.4 kg 57.5 kg    Examination: General exam: Conversant, in no acute distress Respiratory system: normal chest rise, clear, no audible wheezing Cardiovascular system: regular rhythm, s1-s2 Gastrointestinal system: Nondistended, nontender, pos BS Central nervous system: No seizures, no tremors Extremities: No cyanosis, no joint deformities Skin: No rashes, no pallor Psychiatry: Affect normal // no auditory hallucinations   Data Reviewed: I have personally reviewed following labs and imaging studies  CBC: Recent Labs  Lab 04/07/19 0437 04/08/19 0043 04/09/19 0758 04/10/19 0113 04/11/19 0548  WBC 14.6* 14.8* 14.1* 12.7* 14.0*  HGB 8.2* 8.2* 8.2* 8.1* 8.2*  HCT 26.7* 25.0* 25.7* 26.2* 25.7*  MCV 98.9 96.9 98.8 100.0 97.3  PLT 204 167 181 173 [id number]   Basic Metabolic Panel: Recent Labs  Lab 04/06/19 0436 04/07/19 0437 04/08/19 0043 04/10/19 0113 04/11/19 0548  NA 139 136 136 137 137  K 4.4 4.5 4.8 5.1 4.9  CL 111 109 109 110 109  CO2 18* 22 20* 18* 18*  GLUCOSE 118* 147* 149* 100* 167*  BUN 62* 57* 50* 52* 50*  CREATININE 3.25* 3.13* 3.13* 3.42* 3.19*  CALCIUM 7.5* 8.0* 8.1* 8.2* 8.2*   GFR: Estimated Creatinine Clearance: 11.2 mL/min (A) (by C-G formula based on SCr of 3.19 mg/dL (H)). Liver Function Tests: Recent Labs  Lab 04/10/19 0113  AST 15  ALT 17  ALKPHOS 39  BILITOT 0.5  PROT 4.7*  ALBUMIN 2.4*   No results for input(s): LIPASE, AMYLASE in the last 168  hours. No results for input(s): AMMONIA in the last 168 hours. Coagulation Profile: Recent Labs  Lab 04/07/19 1555 04/08/19 0043 04/09/19 0758 04/10/19 0113 04/11/19 0759  INR 1.1 1.2 1.4* 1.6* 1.8*   Cardiac Enzymes: No results for input(s): CKTOTAL, CKMB, CKMBINDEX, TROPONINI in the last 168 hours. BNP (last 3 results) No results for input(s): PROBNP in the last 8760 hours. HbA1C: No results for input(s): HGBA1C in the last 72 hours. CBG: Recent Labs  Lab 04/10/19 1133 04/10/19 1616 04/10/19 2127 04/11/19 0805 04/11/19 1148  GLUCAP 208* 210* 183* 147* 164*   Lipid Profile: No results for input(s): CHOL, HDL, LDLCALC, TRIG, CHOLHDL, LDLDIRECT in the last 72 hours. Thyroid Function Tests: No results for input(s): TSH, T4TOTAL, FREET4, T3FREE, THYROIDAB in the last 72 hours. Anemia Panel: No results for input(s): VITAMINB12, FOLATE, FERRITIN, TIBC, IRON, RETICCTPCT in the last 72 hours. Sepsis Labs: No results for input(s): PROCALCITON, LATICACIDVEN in the last 168 hours.  Recent Results (from the past 240 hour(s))  Culture, Urine     Status: Abnormal   Collection Time: 04/03/19  2:58  PM   Specimen: Urine, Catheterized  Result Value Ref Range Status   Specimen Description   Final    URINE, CATHETERIZED Performed at [facility] [address], [place], [place] [address]    Special Requests   Final    NONE Performed at [facility], [place] [address] [place], [place] [address]    Culture 80,000 COLONIES/mL YEAST (A)  Final   Report Status 04/04/2019 FINAL  Final  SARS CORONAVIRUS 2 (TAT 6-24 HRS) Nasopharyngeal Nasopharyngeal Swab     Status: None   Collection Time: 04/06/19  8:33 PM   Specimen: Nasopharyngeal Swab  Result Value Ref Range Status   SARS Coronavirus 2 NEGATIVE NEGATIVE Final    Comment: (NOTE) SARS-CoV-2 target nucleic acids are NOT DETECTED. The SARS-CoV-2 RNA is generally detectable in upper and lower  respiratory specimens during the acute phase of infection. Negative results do not preclude SARS-CoV-2 infection, do not rule out co-infections with other pathogens, and should not be used as the sole basis for treatment or other patient management decisions. Negative results must be combined with clinical observations, patient history, and epidemiological information. The expected result is Negative. Fact Sheet for Patients: SugarRoll.be Fact Sheet for Healthcare Providers: https://www.woods-mathews.com/ This test is not yet approved or cleared by the [place] FDA and  has been authorized for detection and/or diagnosis of SARS-CoV-2 by FDA under an Emergency Use Authorization (EUA). This EUA will remain  in effect (meaning this test can be used) for the duration of the COVID-19 declaration under Section 56 4(b)(1) of the Act, 21 U.S.C. section 360bbb-3(b)(1), unless the authorization is terminated or revoked sooner. Performed at [facility], [place] [address], [place], [place] [address]      Radiology Studies: No results found.  Scheduled Meds: . aspirin EC  81 mg Oral Daily  . atorvastatin  40 mg Oral q1800  . carvedilol  25 mg Oral BID  . feeding supplement (ENSURE ENLIVE)  237 mL Oral BID BM  . fluticasone  1 spray Each Nare q1800  . hydrALAZINE  25 mg Oral Q8H  . insulin aspart  0-5 Units Subcutaneous QHS  . insulin aspart  0-9 Units Subcutaneous TID WC  . levothyroxine  50 mcg Oral Q0600  . loratadine  10 mg Oral Daily  . multivitamin with minerals  1 tablet Oral Daily  . nystatin   Topical TID  . OLANZapine  15 mg Oral QHS  . sodium bicarbonate  650 mg Oral BID  . terazosin  5 mg Oral QHS  . warfarin  3 mg Oral ONCE-1800  . Warfarin - Pharmacist Dosing Inpatient   Does not apply q1800   Continuous Infusions: . sodium chloride 10 mL/hr at 04/10/19 1800  . sodium chloride 75 mL/hr at 04/11/19 0759  . heparin 600  Units/hr (04/11/19 1013)     LOS: 15 days   Marylu Lund, MD [facility] Pager On Amion  If 7PM-7AM, please contact night-coverage 04/11/2019, 3:49 PM

## 2019-04-11 NOTE — Progress Notes (Signed)
Patient is having Shortness of Breath. Audible Wheezing.  Vitals: 97.36F (ax);HR 107;RR 33 169/92;98% on 4 L Nasal Canula. RRT is currently giving the patient a Nebulizer treatment. PCP was notified.

## 2019-04-11 NOTE — Progress Notes (Signed)
Initial Nutrition Assessment  DOCUMENTATION CODES:   Not applicable  INTERVENTION:  - will order Ensure Enlive po BID, each supplement provides 350 kcal and 20 grams of protein - daily multivitamin with minerals. - continue to encourage PO intakes.    NUTRITION DIAGNOSIS:   Increased nutrient needs related to acute illness as evidenced by estimated needs.  GOAL:   Patient will meet greater than or equal to 90% of their needs  MONITOR:   PO intake, Supplement acceptance, Labs, Weight trends  REASON FOR ASSESSMENT:   LOS(day #15)  ASSESSMENT:   84 year old female with history of type 2 DM, HTN, hyperlipidemia, stage 4 CKD, schizoaffective disorder, and hypothyroidism. She was admitted on 8/31 with complains of SOB and was found to have non-STEMI and acute systolic CHF. Cardiology was consulted.  Per flow sheet, patient most recently consumed 100% of dinner on 9/11 (543 kcal, 26 grams protein); 100% of breakfast and 0% of lunch on 9/13 (679 kcal, 33 grams protein); 25% of breakfast on 9/14 (127 kcal, 7 grams protein). Weight has been trending up since admission, but was previously stable from 02/2015-03/2017; no weight recorded from 03/2017 to admission.  Per notes: - acute CHF--now off diuretics and Cardiology has signed off - UTI--improving - non-STEMI--stable - possible CAP--PNA abx discontinued - schizoaffective disorder--stable - AKI on CKD stage 4--slowly improving - RUE DVT--current INR 1.6 with goal of 2-3 - d/c to SNF when INR is therapeutic   Labs reviewed; CBG: 147 mg/dl, BUN: 50 mg/dl, creatinine: 3.19 mg/dl, Ca: 8.2 mg/dl, GFR: 13 ml/min.  Medications reviewed; sliding scale novolog, 50 mcg oral synthroid/day, 650 mg oral sodium bicarb BID. IVF; NS @ 75 ml/hr.     NUTRITION - FOCUSED PHYSICAL EXAM:  completed; no muscle and no fat wasting.   Diet Order:   Diet Order            Diet Carb Modified Fluid consistency: Thin; Room service appropriate? Yes   Diet effective now              EDUCATION NEEDS:   No education needs have been identified at this time  Skin:  Skin Assessment: Reviewed RN Assessment  Last BM:  9/15  Height:   Ht Readings from Last 1 Encounters:  03/03/2019 5\' 3"  (1.6 m)    Weight:   Wt Readings from Last 1 Encounters:  04/10/19 57.5 kg    Ideal Body Weight:  52.3 kg  BMI:  Body mass index is 22.46 kg/m.  Estimated Nutritional Needs:   Kcal:  1600-1800 kcal  Protein:  70-80 grams  Fluid:  >/= 1.6 L/day      Jarome Matin, MS, RD, LDN, [facility] Inpatient Clinical Dietitian Pager # [phone] After hours/weekend pager # [phone]

## 2019-04-12 DIAGNOSIS — R0989 Other specified symptoms and signs involving the circulatory and respiratory systems: Secondary | ICD-10-CM

## 2019-04-12 LAB — BASIC METABOLIC PANEL
Anion gap: 9 (ref 5–15)
BUN: 56 mg/dL — ABNORMAL HIGH (ref 8–23)
CO2: 18 mmol/L — ABNORMAL LOW (ref 22–32)
Calcium: 8.4 mg/dL — ABNORMAL LOW (ref 8.9–10.3)
Chloride: 108 mmol/L (ref 98–111)
Creatinine, Ser: 3.43 mg/dL — ABNORMAL HIGH (ref 0.44–1.00)
GFR calc Af Amer: 14 mL/min — ABNORMAL LOW (ref >60)
GFR calc non Af Amer: 12 mL/min — ABNORMAL LOW (ref >60)
Glucose, Bld: 335 mg/dL — ABNORMAL HIGH (ref 70–99)
Potassium: 6 mmol/L — ABNORMAL HIGH (ref 3.5–5.1)
Sodium: 135 mmol/L (ref 135–145)

## 2019-04-12 LAB — CBC
HCT: 32.1 % — ABNORMAL LOW (ref 36.0–46.0)
Hemoglobin: 9.7 g/dL — ABNORMAL LOW (ref 12.0–15.0)
MCH: 31 pg (ref 26.0–34.0)
MCHC: 30.2 g/dL (ref 30.0–36.0)
MCV: 102.6 fL — ABNORMAL HIGH (ref 80.0–100.0)
Platelets: 214 10*3/uL (ref 150–400)
RBC: 3.13 MIL/uL — ABNORMAL LOW (ref 3.87–5.11)
RDW: 14.4 % (ref 11.5–15.5)
WBC: 25.3 10*3/uL — ABNORMAL HIGH (ref 4.0–10.5)
nRBC: 0 % (ref 0.0–0.2)

## 2019-04-12 LAB — PROTIME-INR
INR: 1.7 — ABNORMAL HIGH (ref 0.8–1.2)
Prothrombin Time: 19.7 seconds — ABNORMAL HIGH (ref 11.4–15.2)

## 2019-04-12 LAB — GLUCOSE, CAPILLARY: Glucose-Capillary: 285 mg/dL — ABNORMAL HIGH (ref 70–99)

## 2019-04-12 MED ORDER — MORPHINE 100MG IN NS 100ML (1MG/ML) PREMIX INFUSION
3.0000 mg/h | INTRAVENOUS | Status: DC
  Administered 2019-04-12: 04:00:00 2 mg/h via INTRAVENOUS
  Filled 2019-04-12: qty 100

## 2019-04-12 MED ORDER — MORPHINE SULFATE (PF) 2 MG/ML IV SOLN
2.0000 mg | INTRAVENOUS | Status: DC | PRN
  Administered 2019-04-12: 2 mg via INTRAVENOUS
  Filled 2019-04-12: qty 1

## 2019-04-27 NOTE — Progress Notes (Signed)
PALLIATIVE NOTE:  Patient transitioned to comfort. Family at the bedside, daughter, son-in-law, and sisters. Chaplain also arrived at the bedside for spiritual support. Patient mouth breathing on NRB. Discussed comfort measures with family. We discussed no further aggressive measures and discontinuing orders not comfort focused. Also discussed transitioning patient to nasal cannula and off of NRB. All family verbalized understanding and agreement. Patient placed on nasal cannula and I performed mouth care. Patient opened eyes and smiled. Family tearful in her response. She has been started on morphine drip for comfort and support. Audible secretions, discussed use of Robinul and Scopolamine patch for excessive secretions.   Therapeutic listening provided as family shared memories of patient singing in the choir and her enjoyment of gospel music. Chaplain provided supporting prayer. Family requested to sing Leone Payor and a few other gospel favorites of patient. During singing of Leone Payor patient opened eyes, noticeable tears on face, she lifted head off of the bed. Family at the bedside loving on patient while singing.   After singing noted patient to take her last breath. Observed with no further breaths. No heart sound on auscultation. Ria Comment, RN entered room to confirm via monitor patient had passed away. Time of death confirmed at 62. Family made aware. They were tearful and at the bedside expressing their love for patient. Chaplain provided prayer at family's request. Family expressed their appreciation of care and support given. Family remaining at bedside with patient.   Total Time: 47 min.   Greater than 50%  of this time was spent counseling and coordinating care related to the above assessment and plan.  Alda Lea, AGPCNP-BC Palliative Medicine Team  Phone: [phone] Fax: [phone] Pager: [phone] Amion: Delane Ginger. Cousar

## 2019-04-27 NOTE — Progress Notes (Signed)
70mL of Morphine drip wasted with Abby D, CN into stericycle.

## 2019-04-27 NOTE — Progress Notes (Signed)
CCMD called to inform this nurse of pt reading asystole and PEA at 1427.

## 2019-04-27 NOTE — Progress Notes (Addendum)
Shift event note: Pt Noted w/ increased WOB and crackles at approx 2100. Minimal improvement w/ neb treatment. Order placed for Lasix 40 mg IV. No significant UOP w/ IV Lasix. Pt became more lethargic and ultimately unresponsive. Sats drifted down to mid 80's. Now in low 90's w/ NRB at 100%. Daughter was notified by staff regarding pt's change in status. At bedside pt was noted w/ "guppy type" respirations. Diffuse crackles through-out w/ respirations in the 30's. Appears to be actively dying. Pt remains DNR. Some initial improvement w/ IV Morphine but short lived. Daughter Lattie Haw) and son-in-law currently at bedside. Daughter ask questions inquiring if all that could be done had been done. Reassured daughter that intubation would only prolong suffering as her heart is failing and she is not a candidate for aggressive management. Likewise kidneys failing and not a candidate for HD. Daughter seems accepting of this and speaks briefly about pt's life. Relates her strength and poise as a woman despite a lifelong struggle w/ mental illness (schizophrenia). Daughter and son-in-law both agree comfort should now be main focus of care. Brief discussion w/ daughter regarding Morphine drip vs boluses and that the continuous drip may provide more consistent comfort and help air hunger. Daughter in agreement w/ Morphine drip. Family very appreciative for our discussion.  Assessment/Plan: 1. Transition to comfort care: Pt actively dying. Family at bedside. Orders placed for comfort measures. Morphine drip initiated. Will continue to monitor closely w/ comfort now focus of care.  Jeryl Columbia, NP-C [facility] Pager [phone]

## 2019-04-27 NOTE — Progress Notes (Signed)
Patient with continued increased work of breathing, respiratory and on call provider called. New orders given and carried out. Daughter, Lattie Haw, updated with significant changes in respiratory status. Carnella Guadalajara I

## 2019-04-27 NOTE — Progress Notes (Signed)
Daughter and son-in-law to bedside for significant changes and decline in patient's respiratory status. Discussion held with Lamar Blinks, NP and questions/concerns answered. Daughter elected to proceed with comfort measures and morphine drip at this time. Will continue to monitor and carry out any new orders. Carnella [place] I

## 2019-04-27 NOTE — Death Summary Note (Signed)
DEATH SUMMARY   Patient Details  Name: Ashley Velez MRN: [id number] DOB: Oct 04, 1934  Admission/Discharge Information   Admit Date:  04/21/2019  Date of Death:    Time of Death:    Length of Stay: 04-Nov-2022  Referring Physician: Crist Infante, MD   Reason(s) for Hospitalization  Shortness of breath  Diagnoses  Preliminary cause of death:  Secondary Diagnoses (including complications and co-morbidities):  Active Problems:   AKI (acute kidney injury) ([place])   Palliative care by specialist   DNR (do not resuscitate) discussion   Non-ST elevation (NSTEMI) myocardial infarction ([place])   Acute systolic heart failure ([place])   Chronic kidney disease (CKD), stage IV (severe) ([place])   Adult failure to thrive   Brief Hospital Course (including significant findings, care, treatment, and services provided and events leading to death)  Ashley Velez is a 84 y.o. year old female who presented secondary to shortness of breath in setting of acute systolic heart failure.  Patient was diuresed with IV Lasix which was eventually discontinued.  Patient was started on some IV fluids secondary to worsening renal function.  Night before death, patient suffered significant respiratory distress requiring nonrebreather.  Goals of care discussions were had overnight and decision was made to make patient full comfort care rather than transferring patient to ICU for intubation.  Patient was also treated for tinea cruris/candidiasis, possible UTI, possible community-acquired pneumonia.  As mentioned above, her acute kidney injury was thought to be secondary to hypovolemia from aggressive diuresis.    Pertinent Labs and Studies  Significant Diagnostic Studies Ct Chest Wo Contrast  Result Date: 21-Apr-2019 CLINICAL DATA:  84 year old female with acute shortness of breath. EXAM: CT CHEST WITHOUT CONTRAST TECHNIQUE: Multidetector CT imaging of the chest was performed following the standard protocol without IV  contrast. COMPARISON:  04/21/2019 radiograph, 12/30/2016 chest CT and prior studies FINDINGS: Cardiovascular: Mild cardiomegaly noted. Heavy coronary artery atherosclerotic calcifications are present. Aortic atherosclerotic calcifications are noted without thoracic aortic aneurysm. A small pericardial effusion is present. Mediastinum/Nodes: No mediastinal mass or enlarged lymph nodes. The visualized esophagus is unremarkable. Thyroid calcifications are unchanged. Lungs/Pleura: Moderate bilateral pleural effusions are noted. Central ground-glass/airspace opacities bilaterally are present. Bilateral LOWER lung atelectasis is noted. No definite mass identified. No evidence of pneumothorax. Upper Abdomen: No acute abnormality identified. Musculoskeletal: No acute or suspicious bony abnormality. IMPRESSION: 1. Moderate bilateral central ground-glass/airspace opacities, favor edema over infection. 2. Moderate bilateral pleural effusions with bibasilar atelectasis. 3. Cardiomegaly and coronary disease. 4.  Aortic Atherosclerosis (ICD10-I70.0). Electronically Signed   By: Margarette Canada M.D.   On: 2019/04/21 15:02   US Renal  Result Date: 21-Apr-2019 CLINICAL DATA:  84 year old female with acute kidney injury. EXAM: RENAL / URINARY TRACT ULTRASOUND COMPLETE COMPARISON:  01/09/2017 ultrasound FINDINGS: Right Kidney: Renal measurements: 8.7 x 3.8 x 3.7 cm = volume: 65 mL. Increased echogenicity noted. Scattered cysts are present. No hydronephrosis. Left Kidney: Renal measurements: 7.4 x 3.5 x 3.7 cm = volume: 47 mL. Increased echogenicity noted. Scattered cysts are present. No hydronephrosis. Bladder: Appears normal for degree of bladder distention. IMPRESSION: 1. Small hyperechoic kidneys compatible with chronic medical renal disease. No evidence of hydronephrosis. 2. Bilateral renal cysts. Electronically Signed   By: Margarette Canada M.D.   On: 04/21/19 16:47   Dg Chest Port 1 View  Result Date: 04/21/19 CLINICAL DATA:   Shortness of breath, wheezing, low oxygen saturation, chronic cough, hypertension, stroke, diabetes mellitus EXAM: PORTABLE CHEST 1 VIEW COMPARISON:  Portable exam 0829 hours  compared to earlier two-view chest exam of 0153 hours FINDINGS: Normal heart size, mediastinal contours, and pulmonary vascularity. Atherosclerotic calcification aorta. Small LEFT pleural effusion. New bibasilar infiltrates since earlier exam could represent aspiration or developing pneumonia. Upper lungs clear. No pneumothorax. Bones demineralized. IMPRESSION: New bibasilar pulmonary infiltrates question pneumonia versus aspiration. Small LEFT pleural effusion. Electronically Signed   By: Lavonia Dana M.D.   On: 02/28/2019 08:43   Dg Chest Port 1v Same Day  Result Date: 04/03/2019 CLINICAL DATA:  Per order request- PNA. SOB. Pt admitted due to hypoxia. Has h/o stroke, DM, HTN. Nonsmoker. EXAM: PORTABLE CHEST 1 VIEW COMPARISON:  02/28/2019 FINDINGS: The heart is enlarged. There are bilateral pleural effusions. Bibasilar opacities now obscure the hemidiaphragms on both sides. Lung apices are spared. IMPRESSION: Bibasilar opacities and pleural effusions favoring pulmonary edema. Multifocal pneumonia could have a similar appearance. Electronically Signed   By: Nolon Nations M.D.   On: 04/03/2019 15:37   Vas [place] Upper Extremity Venous Duplex  Result Date: 04/08/2019 UPPER VENOUS STUDY  Indications: Pain, Swelling, and SOB Risk Factors: Type 2 DM, Stage IV CKD, and non STEMI and acute systolic CHF. Limitations: IV placement in the distal to mid upper arm and dressing. Comparison Study: No previous exam available Performing Technologist: Toma Copier RVS  Examination Guidelines: A complete evaluation includes B-mode imaging, spectral Doppler, color Doppler, and power Doppler as needed of all accessible portions of each vessel. Bilateral testing is considered an integral part of a complete examination. Limited examinations for  reoccurring indications may be performed as noted.  Right Findings: +----------+------------+---------+-----------+----------+---------------------+ RIGHT     CompressiblePhasicitySpontaneousProperties       Summary        +----------+------------+---------+-----------+----------+---------------------+ IJV           Full       Yes       Yes                                    +----------+------------+---------+-----------+----------+---------------------+ Subclavian    Full       Yes       Yes                                    +----------+------------+---------+-----------+----------+---------------------+ Axillary      Full       Yes       Yes                                    +----------+------------+---------+-----------+----------+---------------------+ Brachial    Partial                                   Acute, Unable to                                                        visualize the distal  region due to IV                                                              dessing.        +----------+------------+---------+-----------+----------+---------------------+ Radial        Full                                                        +----------+------------+---------+-----------+----------+---------------------+ Ulnar         Full                                                        +----------+------------+---------+-----------+----------+---------------------+ Cephalic      Full                                                        +----------+------------+---------+-----------+----------+---------------------+ Basilic       Full                                                        +----------+------------+---------+-----------+----------+---------------------+  Left Findings: +----------+------------+---------+-----------+----------+---------------------+  LEFT      CompressiblePhasicitySpontaneousProperties       Summary        +----------+------------+---------+-----------+----------+---------------------+ IJV           Full       Yes       Yes                                    +----------+------------+---------+-----------+----------+---------------------+ Subclavian               Yes       Yes               Unable to compress                                                        due to the clavicle  +----------+------------+---------+-----------+----------+---------------------+  Summary:  Right: No evidence of superficial vein thrombosis in the upper extremity. No evidence of thrombosis in the subclavian. Findings consistent with acute deep vein thrombosis involving the right brachial veins.  Left: No evidence of thrombosis in the subclavian.  *See table(s) above for measurements and observations.  Diagnosing physician: Servando Snare MD Electronically signed by Servando Snare MD on 04/08/2019 at 12:14:31 AM.    Final     Microbiology Recent Results (  from the past 240 hour(s))  Culture, Urine     Status: Abnormal   Collection Time: 04/03/19  2:58 PM   Specimen: Urine, Catheterized  Result Value Ref Range Status   Specimen Description   Final    URINE, CATHETERIZED Performed at [facility] [address] [place], [place] [address]    Special Requests   Final    NONE Performed at [facility], [place] [address], [place], [place] [address]    Culture 80,000 COLONIES/mL YEAST (A)  Final   Report Status 04/04/2019 FINAL  Final  SARS CORONAVIRUS 2 (TAT 6-24 HRS) Nasopharyngeal Nasopharyngeal Swab     Status: None   Collection Time: 04/06/19  8:33 PM   Specimen: Nasopharyngeal Swab  Result Value Ref Range Status   SARS Coronavirus 2 NEGATIVE NEGATIVE Final    Comment: (NOTE) SARS-CoV-2 target nucleic acids are NOT DETECTED. The SARS-CoV-2 RNA is generally detectable in upper and  lower respiratory specimens during the acute phase of infection. Negative results do not preclude SARS-CoV-2 infection, do not rule out co-infections with other pathogens, and should not be used as the sole basis for treatment or other patient management decisions. Negative results must be combined with clinical observations, patient history, and epidemiological information. The expected result is Negative. Fact Sheet for Patients: SugarRoll.be Fact Sheet for Healthcare Providers: https://www.woods-mathews.com/ This test is not yet approved or cleared by the [place] FDA and  has been authorized for detection and/or diagnosis of SARS-CoV-2 by FDA under an Emergency Use Authorization (EUA). This EUA will remain  in effect (meaning this test can be used) for the duration of the COVID-19 declaration under Section 56 4(b)(1) of the Act, 21 U.S.C. section 360bbb-3(b)(1), unless the authorization is terminated or revoked sooner. Performed at [facility], [place] [address], [place], [place] [address]     Lab Basic Metabolic Panel: Recent Labs  Lab 04/07/19 0437 04/08/19 0043 04/10/19 0113 04/11/19 0548 Apr 13, 2019 0528  NA 136 136 137 137 135  K 4.5 4.8 5.1 4.9 6.0*  CL 109 109 110 109 108  CO2 22 20* 18* 18* 18*  GLUCOSE 147* 149* 100* 167* 335*  BUN 57* 50* 52* 50* 56*  CREATININE 3.13* 3.13* 3.42* 3.19* 3.43*  CALCIUM 8.0* 8.1* 8.2* 8.2* 8.4*   Liver Function Tests: Recent Labs  Lab 04/10/19 0113  AST 15  ALT 17  ALKPHOS 39  BILITOT 0.5  PROT 4.7*  ALBUMIN 2.4*   No results for input(s): LIPASE, AMYLASE in the last 168 hours. No results for input(s): AMMONIA in the last 168 hours. CBC: Recent Labs  Lab 04/08/19 0043 04/09/19 0758 04/10/19 0113 04/11/19 0548 2019-04-13 0528  WBC 14.8* 14.1* 12.7* 14.0* 25.3*  HGB 8.2* 8.2* 8.1* 8.2* 9.7*  HCT 25.0* 25.7* 26.2* 25.7* 32.1*  MCV 96.9 98.8 100.0 97.3 102.6*  PLT  167 181 173 166 214   Cardiac Enzymes: No results for input(s): CKTOTAL, CKMB, CKMBINDEX, TROPONINI in the last 168 hours. Sepsis Labs: Recent Labs  Lab 04/09/19 0758 04/10/19 0113 04/11/19 0548 April 13, 2019 0528  WBC 14.1* 12.7* 14.0* 25.3*    Procedures/Operations   None   Cordelia Poche Apr 13, 2019, 4:13 PM

## 2019-04-27 DEATH — deceased

## 2020-01-06 IMAGING — CT CT CHEST W/O CM
2 of 6 series · 15 of 36 positions shown, 19 images · non-contrast
Comparison: 03/27/2019 radiograph, 12/30/2016 chest CT and prior
studies

CLINICAL DATA: 84-year-old female with acute shortness of breath.

EXAM:
CT CHEST WITHOUT CONTRAST
TECHNIQUE: Multidetector CT imaging of the chest was performed following the
standard protocol without IV contrast.

[Series 2: thorax · axial · 0.58mm/px · z∈[-219,+5]mm · 14 of 126 slices shown, 18 images]
[im 7/126  mediastinal]
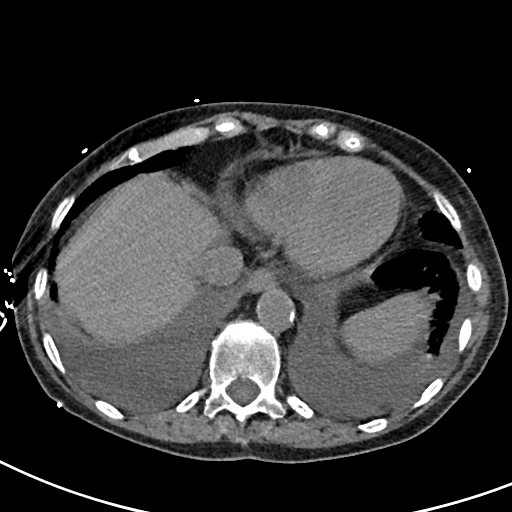
[im 7/126  lung]
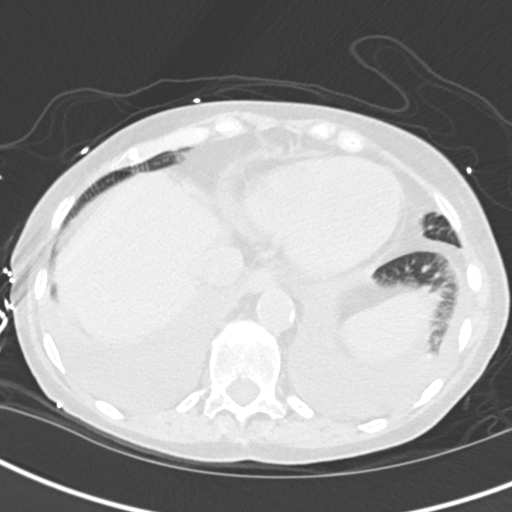
[im 19/126  lung]
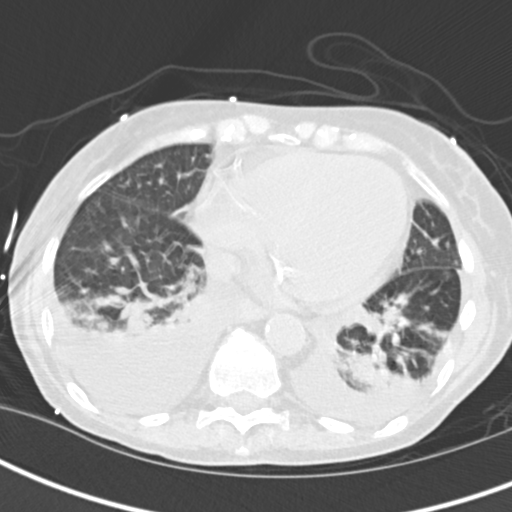
[im 26/126  lung]
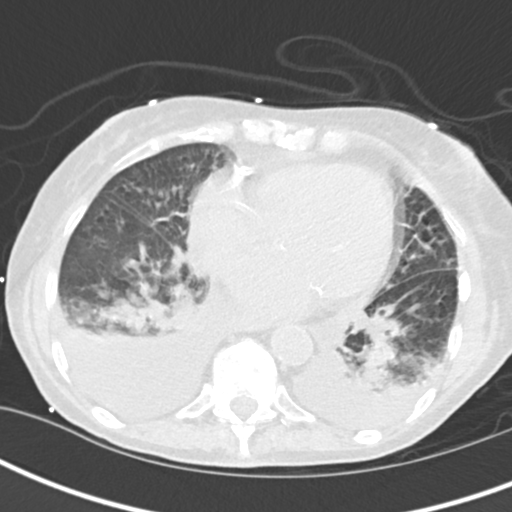
[im 32/126  lung]
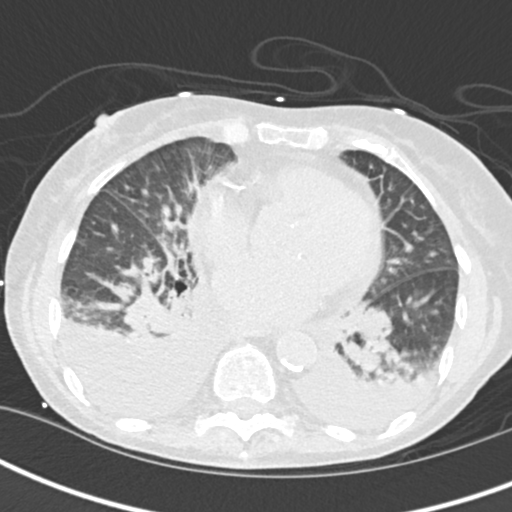
[im 44/126  mediastinal]
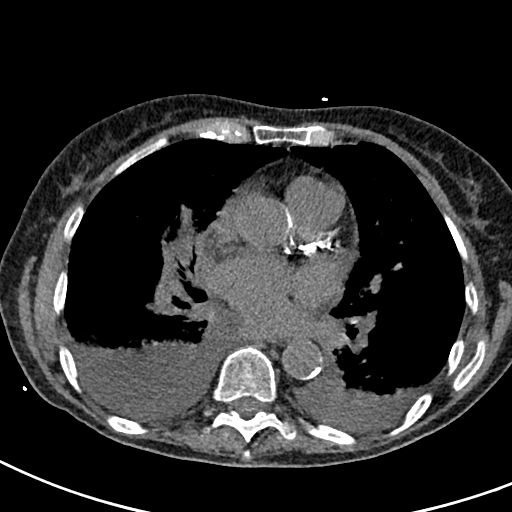
[im 44/126  lung]
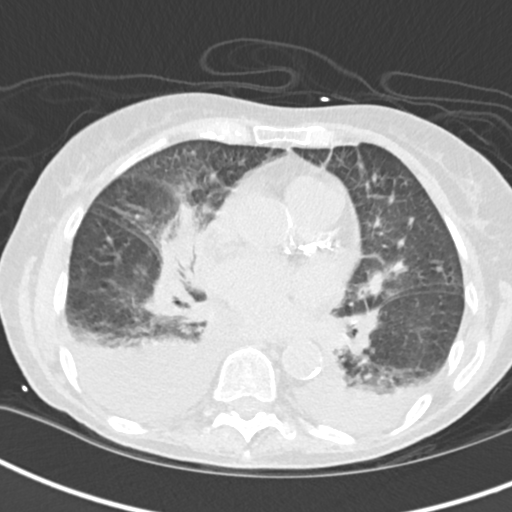
[im 51/126  lung]
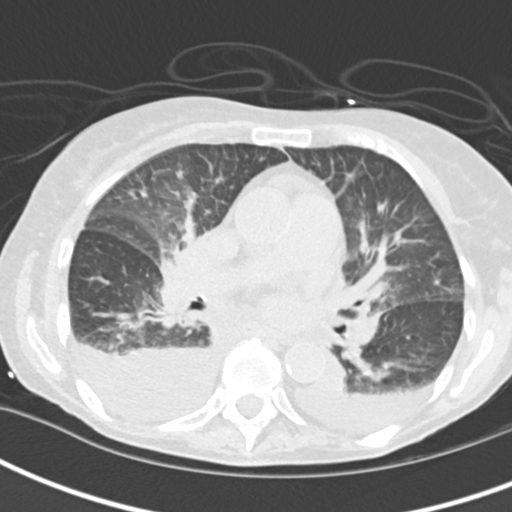
[im 57/126  lung]
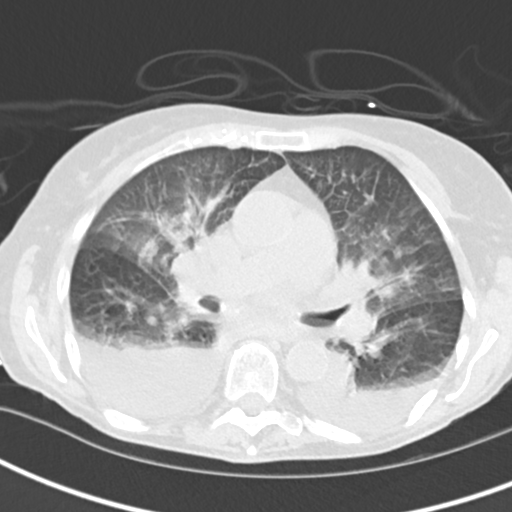
[im 69/126  lung]
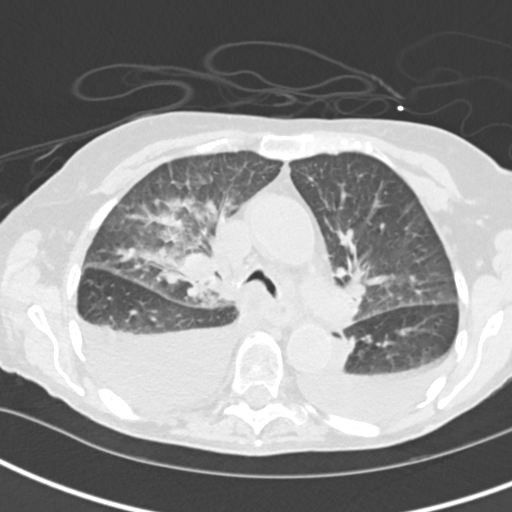
[im 76/126  mediastinal]
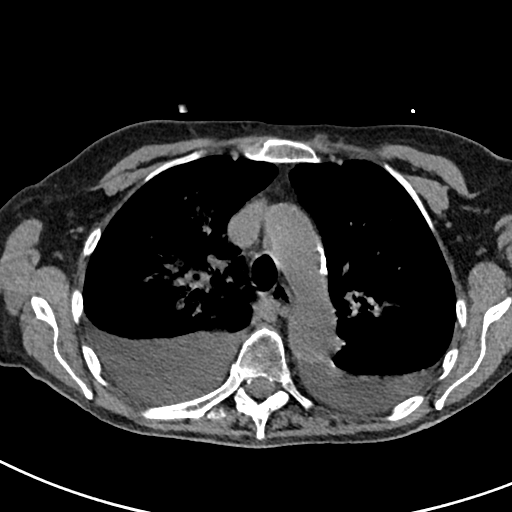
[im 76/126  lung]
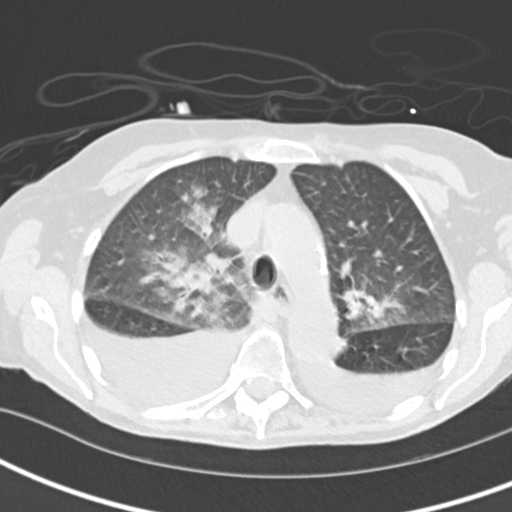
[im 82/126  lung]
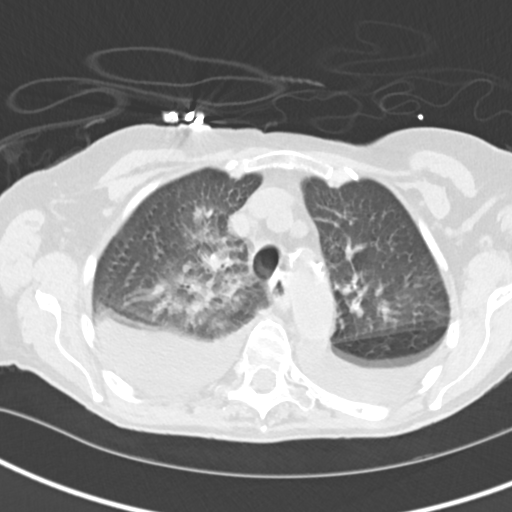
[im 94/126  lung]
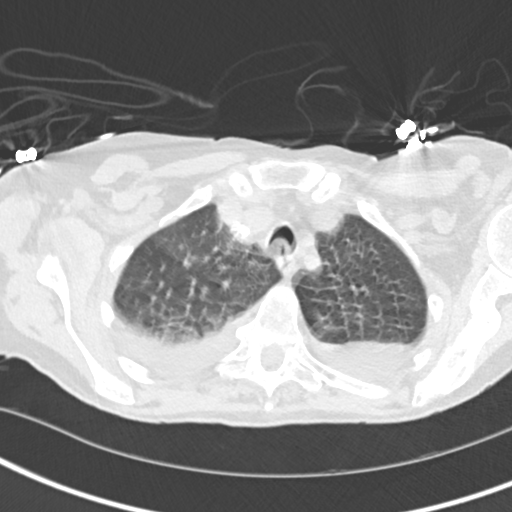
[im 101/126  lung]
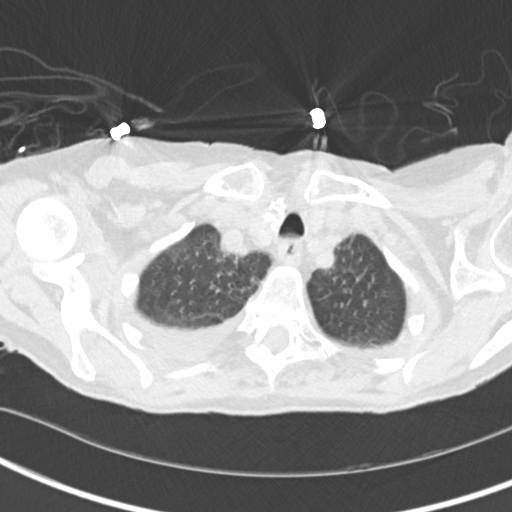
[im 107/126  mediastinal]
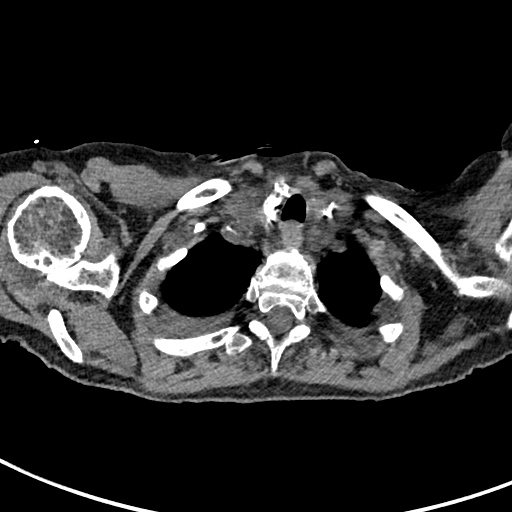
[im 107/126  lung]
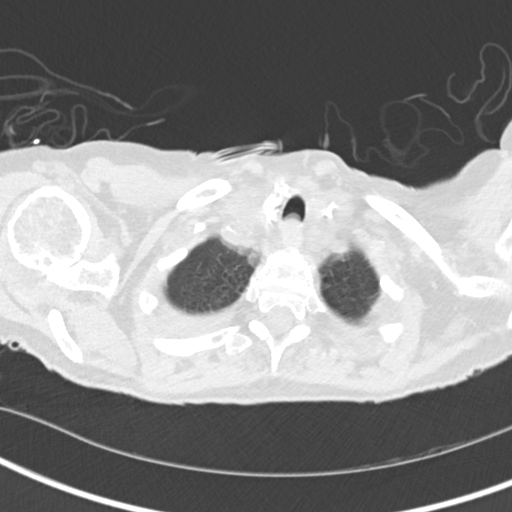
[im 119/126  lung]
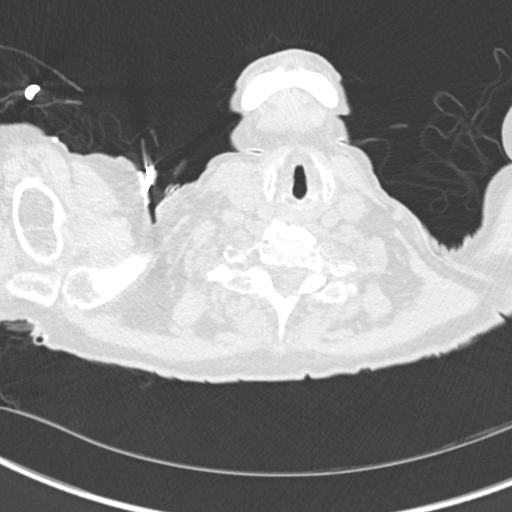

[Series 9: coronal · coronal · 0.52mm/px · 1 of 103 slices shown]
[im 52/103  lung]
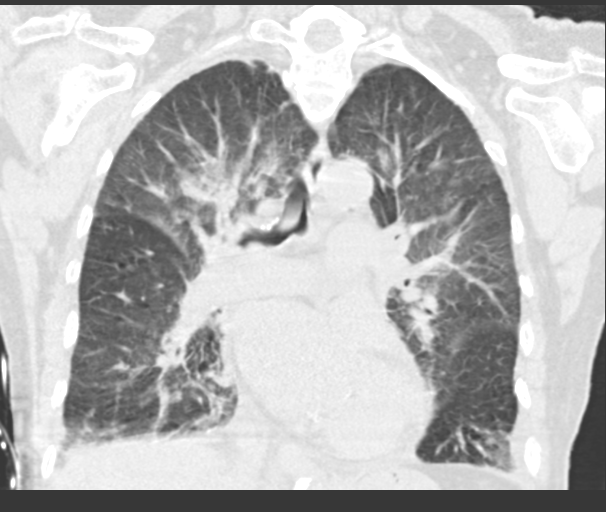

[15 of 36 positions shown; findings below may reference images not displayed]

FINDINGS: Cardiovascular: Mild cardiomegaly noted. Heavy coronary artery
atherosclerotic calcifications are present. Aortic atherosclerotic
calcifications are noted without thoracic aortic aneurysm. A small
pericardial effusion is present.

Mediastinum/Nodes: No mediastinal mass or enlarged lymph nodes. The
visualized esophagus is unremarkable. Thyroid calcifications are
unchanged.

Lungs/Pleura: Moderate bilateral pleural effusions are noted.
Central ground-glass/airspace opacities bilaterally are present.
Bilateral LOWER lung atelectasis is noted. No definite mass
identified. No evidence of pneumothorax.

Upper Abdomen: No acute abnormality identified.

Musculoskeletal: No acute or suspicious bony abnormality.
IMPRESSION: 1. Moderate bilateral central ground-glass/airspace opacities, favor
edema over infection.
2. Moderate bilateral pleural effusions with bibasilar atelectasis.
3. Cardiomegaly and coronary disease.
4.  Aortic Atherosclerosis ([id number]).

## 2020-01-13 IMAGING — DX DG CHEST 1V PORT SAME DAY
1 series · 1 of 1 positions shown · non-contrast
Comparison: 03/27/2019

CLINICAL DATA: Per order request- PNA. SOB. Pt admitted due to
hypoxia. Has h/o stroke, DM, HTN. Nonsmoker.

EXAM:
PORTABLE CHEST 1 VIEW

[chest ap]
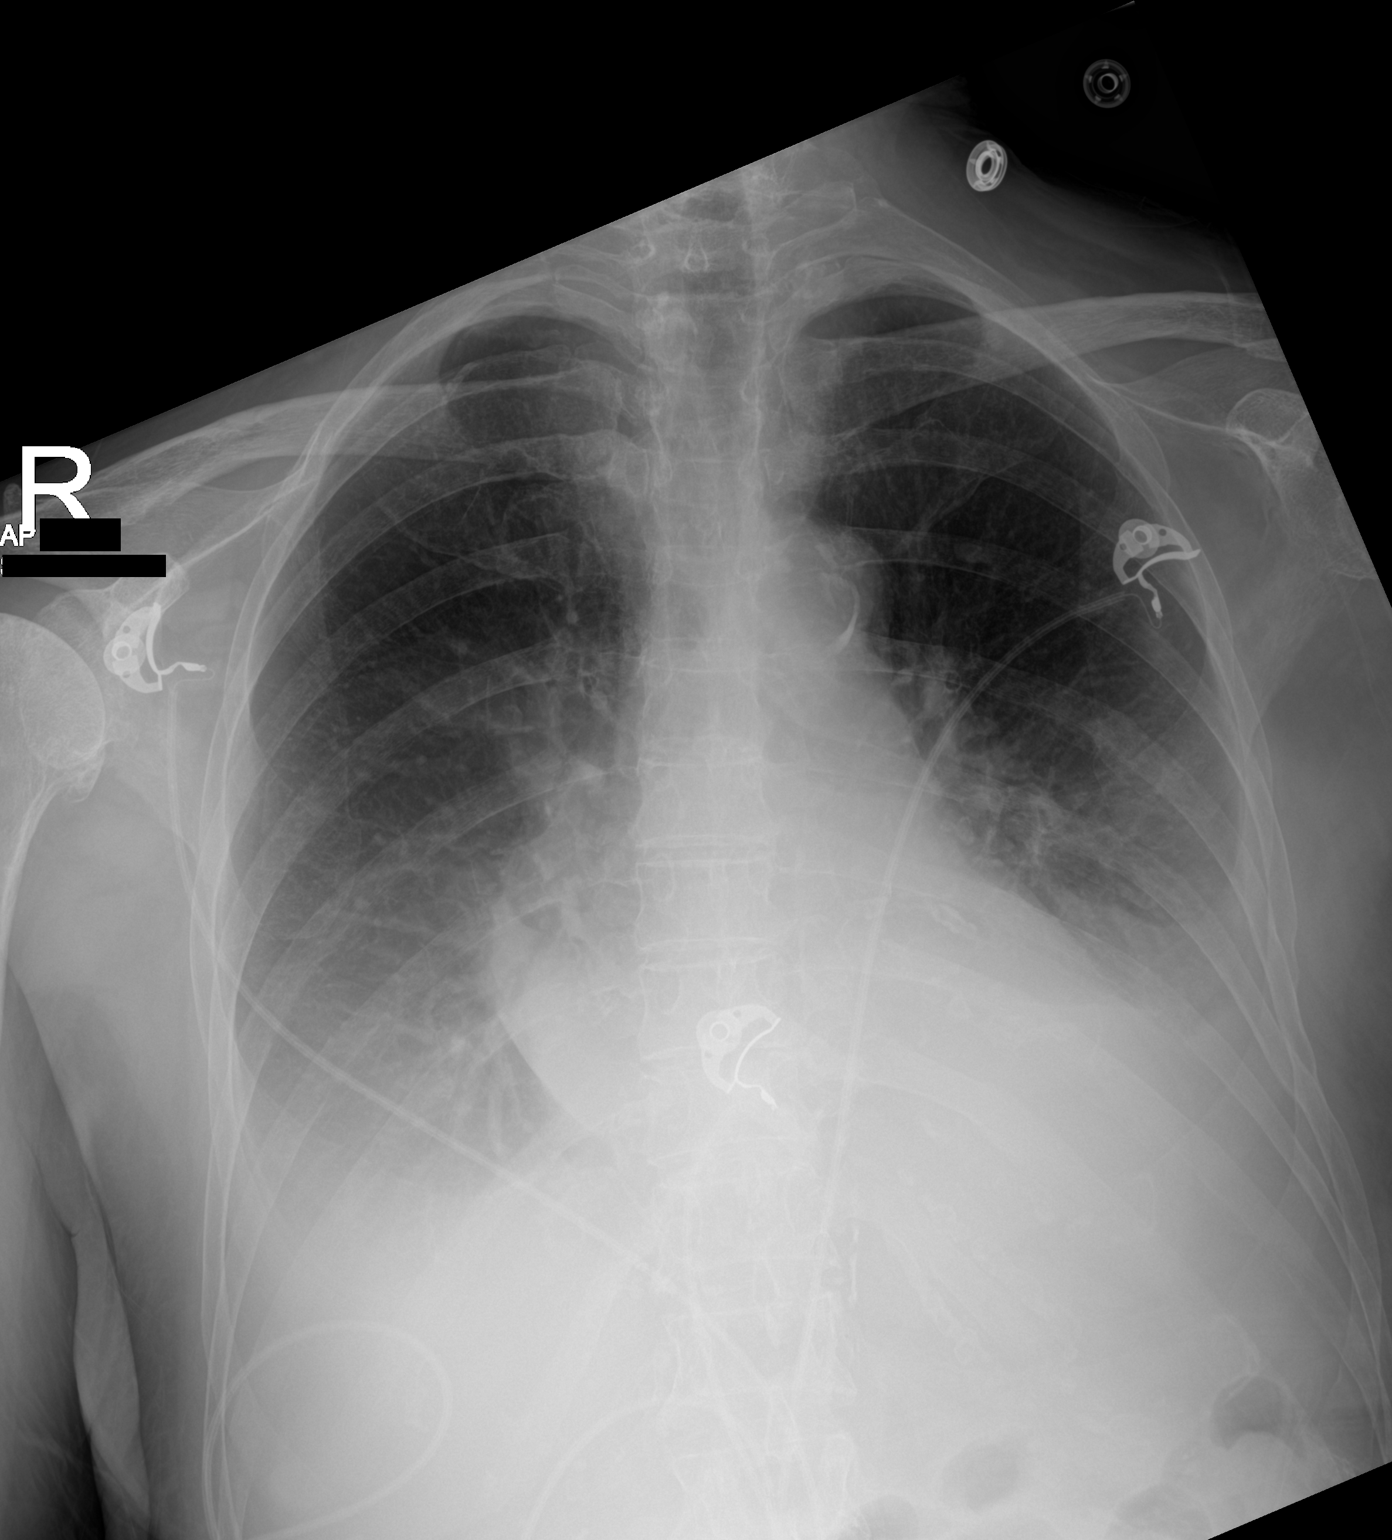

[1 of 1 positions shown; findings below may reference images not displayed]

FINDINGS: The heart is enlarged. There are bilateral pleural effusions.
Bibasilar opacities now obscure the hemidiaphragms on both sides.
Lung apices are spared.
IMPRESSION: Bibasilar opacities and pleural effusions favoring pulmonary edema.
Multifocal pneumonia could have a similar appearance.
